# Patient Record
Sex: Female | Born: 1985 | Race: White | Hispanic: No | Marital: Single | Smoking: Former smoker
Health system: Southern US, Community
[De-identification: ages and names within clinical notes are randomized; demographics above are authoritative.]

## PROBLEM LIST (undated history)

## (undated) ENCOUNTER — Inpatient Hospital Stay (HOSPITAL_COMMUNITY): Payer: Self-pay

## (undated) ENCOUNTER — Ambulatory Visit (HOSPITAL_COMMUNITY): Admission: EM | Payer: Medicaid Other

## (undated) DIAGNOSIS — F410 Panic disorder [episodic paroxysmal anxiety] without agoraphobia: Secondary | ICD-10-CM

## (undated) DIAGNOSIS — F32A Depression, unspecified: Secondary | ICD-10-CM

## (undated) DIAGNOSIS — G43109 Migraine with aura, not intractable, without status migrainosus: Secondary | ICD-10-CM

## (undated) DIAGNOSIS — K219 Gastro-esophageal reflux disease without esophagitis: Secondary | ICD-10-CM

## (undated) DIAGNOSIS — Z8774 Personal history of (corrected) congenital malformations of heart and circulatory system: Secondary | ICD-10-CM

## (undated) DIAGNOSIS — F329 Major depressive disorder, single episode, unspecified: Secondary | ICD-10-CM

## (undated) HISTORY — DX: Migraine with aura, not intractable, without status migrainosus: G43.109

## (undated) HISTORY — PX: TONSILLECTOMY: SUR1361

## (undated) HISTORY — DX: Personal history of (corrected) congenital malformations of heart and circulatory system: Z87.74

## (undated) HISTORY — PX: CARDIAC SURGERY: SHX584

---
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
## 2006-03-28 ENCOUNTER — Emergency Department: Payer: Self-pay | Admitting: Emergency Medicine

## 2006-05-08 ENCOUNTER — Ambulatory Visit: Payer: Self-pay | Admitting: Cardiovascular Disease

## 2006-05-15 ENCOUNTER — Ambulatory Visit: Payer: Self-pay

## 2006-05-15 ENCOUNTER — Encounter: Payer: Self-pay | Admitting: Internal Medicine

## 2006-06-05 ENCOUNTER — Inpatient Hospital Stay (HOSPITAL_COMMUNITY): Admission: AD | Admit: 2006-06-05 | Discharge: 2006-06-07 | Payer: Self-pay | Admitting: Obstetrics & Gynecology

## 2006-10-05 ENCOUNTER — Emergency Department: Payer: Self-pay | Admitting: Emergency Medicine

## 2006-10-27 ENCOUNTER — Emergency Department: Payer: Self-pay | Admitting: Emergency Medicine

## 2006-12-01 ENCOUNTER — Encounter: Payer: Self-pay | Admitting: Nurse Practitioner

## 2006-12-01 DIAGNOSIS — K219 Gastro-esophageal reflux disease without esophagitis: Secondary | ICD-10-CM | POA: Insufficient documentation

## 2006-12-10 ENCOUNTER — Emergency Department: Payer: Self-pay | Admitting: Emergency Medicine

## 2007-01-13 ENCOUNTER — Emergency Department: Payer: Self-pay | Admitting: Emergency Medicine

## 2007-04-19 ENCOUNTER — Emergency Department: Payer: Self-pay | Admitting: Emergency Medicine

## 2007-04-20 ENCOUNTER — Emergency Department (HOSPITAL_COMMUNITY): Admission: EM | Admit: 2007-04-20 | Discharge: 2007-04-20 | Payer: Self-pay | Admitting: Emergency Medicine

## 2007-05-04 ENCOUNTER — Emergency Department: Payer: Self-pay | Admitting: Emergency Medicine

## 2007-07-22 ENCOUNTER — Emergency Department: Payer: Self-pay | Admitting: Unknown Physician Specialty

## 2007-09-27 ENCOUNTER — Emergency Department: Payer: Self-pay | Admitting: Emergency Medicine

## 2008-01-04 ENCOUNTER — Emergency Department (HOSPITAL_COMMUNITY): Admission: EM | Admit: 2008-01-04 | Discharge: 2008-01-04 | Payer: Self-pay | Admitting: Emergency Medicine

## 2008-01-11 ENCOUNTER — Emergency Department (HOSPITAL_COMMUNITY): Admission: EM | Admit: 2008-01-11 | Discharge: 2008-01-11 | Payer: Self-pay | Admitting: Emergency Medicine

## 2008-01-16 ENCOUNTER — Emergency Department (HOSPITAL_COMMUNITY): Admission: EM | Admit: 2008-01-16 | Discharge: 2008-01-17 | Payer: Self-pay | Admitting: Emergency Medicine

## 2008-04-12 ENCOUNTER — Emergency Department (HOSPITAL_COMMUNITY): Admission: EM | Admit: 2008-04-12 | Discharge: 2008-04-12 | Payer: Self-pay | Admitting: Emergency Medicine

## 2008-04-26 ENCOUNTER — Emergency Department (HOSPITAL_COMMUNITY): Admission: EM | Admit: 2008-04-26 | Discharge: 2008-04-27 | Payer: Self-pay | Admitting: Emergency Medicine

## 2008-07-29 ENCOUNTER — Emergency Department (HOSPITAL_COMMUNITY): Admission: EM | Admit: 2008-07-29 | Discharge: 2008-07-29 | Payer: Self-pay | Admitting: Emergency Medicine

## 2008-08-30 ENCOUNTER — Emergency Department (HOSPITAL_COMMUNITY): Admission: EM | Admit: 2008-08-30 | Discharge: 2008-08-30 | Payer: Self-pay | Admitting: Emergency Medicine

## 2008-12-14 ENCOUNTER — Emergency Department (HOSPITAL_COMMUNITY): Admission: EM | Admit: 2008-12-14 | Discharge: 2008-12-14 | Payer: Self-pay | Admitting: Emergency Medicine

## 2009-05-30 ENCOUNTER — Emergency Department (HOSPITAL_COMMUNITY): Admission: EM | Admit: 2009-05-30 | Discharge: 2009-05-31 | Payer: Self-pay | Admitting: Emergency Medicine

## 2009-07-21 ENCOUNTER — Emergency Department (HOSPITAL_COMMUNITY): Admission: EM | Admit: 2009-07-21 | Discharge: 2009-07-21 | Payer: Self-pay | Admitting: Emergency Medicine

## 2009-10-02 ENCOUNTER — Emergency Department (HOSPITAL_COMMUNITY): Admission: EM | Admit: 2009-10-02 | Discharge: 2009-10-02 | Payer: Self-pay | Admitting: Family Medicine

## 2009-11-06 ENCOUNTER — Emergency Department (HOSPITAL_COMMUNITY): Admission: EM | Admit: 2009-11-06 | Discharge: 2009-11-06 | Payer: Self-pay | Admitting: Family Medicine

## 2009-12-25 ENCOUNTER — Emergency Department (HOSPITAL_COMMUNITY): Admission: EM | Admit: 2009-12-25 | Discharge: 2009-12-25 | Payer: Self-pay | Admitting: Family Medicine

## 2009-12-29 ENCOUNTER — Emergency Department (HOSPITAL_COMMUNITY): Admission: EM | Admit: 2009-12-29 | Discharge: 2009-12-29 | Payer: Self-pay | Admitting: Emergency Medicine

## 2010-03-01 ENCOUNTER — Emergency Department (HOSPITAL_COMMUNITY): Admission: EM | Admit: 2010-03-01 | Discharge: 2010-03-01 | Payer: Self-pay | Admitting: Family Medicine

## 2010-06-20 LAB — POCT I-STAT, CHEM 8
BUN: 9 mg/dL (ref 6–23)
Chloride: 103 mEq/L (ref 96–112)
Glucose, Bld: 88 mg/dL (ref 70–99)
Hemoglobin: 14.6 g/dL (ref 12.0–15.0)
Potassium: 4.5 mEq/L (ref 3.5–5.1)
Sodium: 140 mEq/L (ref 135–145)

## 2010-06-20 LAB — D-DIMER, QUANTITATIVE: D-Dimer, Quant: 0.28 ug/mL-FEU (ref 0.00–0.48)

## 2010-07-12 LAB — COMPREHENSIVE METABOLIC PANEL
ALT: 9 U/L (ref 0–35)
Albumin: 4.8 g/dL (ref 3.5–5.2)
BUN: 4 mg/dL — ABNORMAL LOW (ref 6–23)
Creatinine, Ser: 0.68 mg/dL (ref 0.4–1.2)
GFR calc Af Amer: >60 mL/min (ref >60)
GFR calc non Af Amer: >60 mL/min (ref >60)
Glucose, Bld: 92 mg/dL (ref 70–99)
Sodium: 142 mEq/L (ref 135–145)
Total Protein: 7 g/dL (ref 6.0–8.3)

## 2010-07-12 LAB — DIFFERENTIAL
Basophils Absolute: 0.1 10*3/uL (ref 0.0–0.1)
Basophils Relative: 1 % (ref 0–1)
Eosinophils Relative: 2 % (ref 0–5)
Monocytes Absolute: 0.7 10*3/uL (ref 0.1–1.0)
Monocytes Relative: 8 % (ref 3–12)
Neutro Abs: 5.6 10*3/uL (ref 1.7–7.7)
Neutrophils Relative %: 66 % (ref 43–77)

## 2010-07-12 LAB — URINALYSIS, ROUTINE W REFLEX MICROSCOPIC
Bilirubin Urine: NEGATIVE
Nitrite: NEGATIVE
Protein, ur: NEGATIVE mg/dL
pH: 6.5 (ref 5.0–8.0)

## 2010-07-12 LAB — CBC
Hemoglobin: 13.8 g/dL (ref 12.0–15.0)
MCV: 88 fL (ref 78.0–100.0)
Platelets: 230 10*3/uL (ref 150–400)

## 2010-07-12 LAB — LIPASE, BLOOD: Lipase: 19 U/L (ref 11–59)

## 2010-07-12 LAB — URINE MICROSCOPIC-ADD ON

## 2010-07-17 LAB — URINALYSIS, ROUTINE W REFLEX MICROSCOPIC
Ketones, ur: NEGATIVE mg/dL
Nitrite: NEGATIVE
Urobilinogen, UA: 1 mg/dL (ref 0.0–1.0)
pH: 7.5 (ref 5.0–8.0)

## 2010-07-17 LAB — URINE MICROSCOPIC-ADD ON

## 2010-07-17 LAB — POCT I-STAT, CHEM 8
Chloride: 103 mEq/L (ref 96–112)
Creatinine, Ser: 0.6 mg/dL (ref 0.4–1.2)
Glucose, Bld: 95 mg/dL (ref 70–99)

## 2010-08-11 ENCOUNTER — Inpatient Hospital Stay (INDEPENDENT_AMBULATORY_CARE_PROVIDER_SITE_OTHER)
Admission: RE | Admit: 2010-08-11 | Discharge: 2010-08-11 | Disposition: A | Discharge status: Home / Self Care | Payer: Self-pay | Source: Ambulatory Visit | Attending: Family Medicine | Admitting: Family Medicine

## 2010-08-11 DIAGNOSIS — R071 Chest pain on breathing: Secondary | ICD-10-CM

## 2010-08-23 NOTE — Op Note (Signed)
[name], [name]                 ACCOUNT NO.:  [id number]   MEDICAL RECORD NO.:  [id number]          PATIENT TYPE:  AMB   LOCATION:  DSC                          FACILITY:  [facility]   PHYSICIAN:  Kathy Breach, M.D.      DATE OF BIRTH:  [birth year]   DATE OF PROCEDURE:  08/09/2004  DATE OF DISCHARGE:                                 OPERATIVE REPORT   PREOPERATIVE DIAGNOSIS:  Recurrent and chronic tonsillitis.   OPERATIVE PROCEDURE:  Adenotonsillectomy.   POSTOPERATIVE DIAGNOSIS:  Recurrent and chronic tonsillitis.   DESCRIPTION OF PROCEDURE:  With the patient under general orotracheal  anesthesia, the Crowe-Davis mouth gag was inserted and the patient put in  the [place] position.  Tonsils were 1+ enlarged, deeply invasive into the  fossa.  The soft palate was normal in configuration.  The tonsils were  nonpulsatile to palpation.  Mirror visualization of the nasopharynx revealed  midline adenoid tissue present.  The left tonsil was grasped at the superior  pole and removed by electrical dissection, maintaining hemostasis with  electrocautery.  The right tonsil, both being very deeply seated and  fibrotic, removed by electrical dissection, maintaining hemostasis with  electrocauterization.  Red rubber catheter was then passed through the left  nasal chamber and used to elevate the soft palate.  Under mirror  visualization with the suction cautery unit, adenoidal tissue ablated down  to the constricture muscle fibers midline to the Rosenmuller's fossa  bilaterally as they were present.  Blood loss during the procedure was less  than 15 cc.  The patient tolerated the procedure well and was taken to the  recovery room in stable general condition.      JGL/MEDQ  D:  08/09/2004  T:  08/09/2004  Job:  [id number]

## 2010-08-23 NOTE — Assessment & Plan Note (Signed)
[facility]                            CARDIOLOGY OFFICE NOTE   NAME:[name], [name]                        MRN:          [id number]  DATE:05/08/2006                            DOB:          02-16-1986    Miss [name] is a delightful 25 year old single female referred by Dr.  Tamela Oddi for history of congenital VSD. She is [redacted] weeks pregnant.  Her due date is March 9th. This is her first pregnancy. She has not had  any complications so far. Apparently she thought there was a question  about what sort of delivery, vaginal or C-section, would be best for  her. The patient had a VSD repair at the age of 31 months. She does not  recall where this was done or any other details of the surgery. She does  not seem to think there were any other congenital abnormalities except  for the hole in her heart. She recalls being a normal youth, she has  never had any problems with exercise or keeping up with people. There  has been no history of cyanosis, palpitations, or syncope.   The patient has not had an ultrasound recently.   She does not follow SBE prophylaxis.   REVIEW OF SYSTEMS:  Remarkable for no significant chest pain, shortness  of breath, she has had some lower extremity swelling since her pregnancy  but not before. She used to smoke on occasion but does not now. She does  not drink. She does not do drugs. She takes no medications routinely.   Her only other surgery has included a tonsillectomy, wisdom teeth  removal in 2006, her VSD repair was in 1989.   FAMILY HISTORY:  Remarkable for her mother and father being alive. She  has 2 brothers who are alive at age 41 and 40 without congenital heart  disease.   The patient apparently is having a healthy baby boy named [name].   PHYSICAL EXAMINATION:  Remarkable for blood pressure 126/70, pulse is 70  regular.  HEENT: Normal.  LUNGS: Clear.  Carotids are normal.  Her sternum has a midline scar  from her VSD repair. The is an S1, S2.  There is no VSD murmur. There is no split second heart sound. There is  no RV heave.  ABDOMEN: Protuberant consistent with gestational age.  Distal pulses are intact with no edema.   The patient did not have prior EKG for me to review here in our office;  however, she had a normal EKG done at Dr. Marcia Brash office.   IMPRESSION:  The patient is asymptomatic. She does have a residual  murmur. I suspect that her ventricular septal defect patch has held  well. She can deliver the baby any way that is safest for her and the  fetus.   We will do a 2D echocardiogram to make sure that we are not missing any  residual defects; however, this would be very unusual in the setting of  a normal electrocardiogram and no murmur.   So long as her echo is low risk she will be allowed to proceed  with her  pregnancy without any preference towards caesarian section or vaginal  delivery.   Again the patient does not need subacute bacterial endocarditis  prophylaxis.   Further recommendations will be based on the results of her  echocardiogram, however, I think that she should have an uneventful  delivery.     [name]. Toni Emms, MD, [facility]  Electronically Signed    PCN/MedQ  DD: 05/08/2006  DT: 05/08/2006  Job #: [id number]   cc:   Roseanna Rainbow, M.D.

## 2010-11-14 ENCOUNTER — Ambulatory Visit (INDEPENDENT_AMBULATORY_CARE_PROVIDER_SITE_OTHER): Payer: Self-pay

## 2010-11-14 ENCOUNTER — Inpatient Hospital Stay (INDEPENDENT_AMBULATORY_CARE_PROVIDER_SITE_OTHER)
Admission: RE | Admit: 2010-11-14 | Discharge: 2010-11-14 | Disposition: A | Discharge status: Home / Self Care | Payer: Self-pay | Source: Ambulatory Visit | Attending: Family Medicine | Admitting: Family Medicine

## 2010-11-14 DIAGNOSIS — M779 Enthesopathy, unspecified: Secondary | ICD-10-CM

## 2011-02-15 ENCOUNTER — Encounter: Payer: Self-pay | Admitting: *Deleted

## 2011-02-15 ENCOUNTER — Emergency Department (HOSPITAL_COMMUNITY)
Admission: EM | Admit: 2011-02-15 | Discharge: 2011-02-15 | Disposition: A | Discharge status: Home / Self Care | Payer: Self-pay | Attending: Emergency Medicine | Admitting: Emergency Medicine

## 2011-02-15 DIAGNOSIS — IMO0002 Reserved for concepts with insufficient information to code with codable children: Secondary | ICD-10-CM | POA: Insufficient documentation

## 2011-02-15 DIAGNOSIS — M25519 Pain in unspecified shoulder: Secondary | ICD-10-CM | POA: Insufficient documentation

## 2011-02-15 DIAGNOSIS — S43409A Unspecified sprain of unspecified shoulder joint, initial encounter: Secondary | ICD-10-CM

## 2011-02-15 DIAGNOSIS — X58XXXA Exposure to other specified factors, initial encounter: Secondary | ICD-10-CM | POA: Insufficient documentation

## 2011-02-15 MED ORDER — HYDROCODONE-ACETAMINOPHEN 7.5-325 MG/15ML PO SOLN: 15.0000 mL | ORAL | Status: AC | PRN

## 2011-02-15 MED ORDER — IBUPROFEN 400 MG PO TABS: 400.0000 mg | ORAL_TABLET | Freq: Three times a day (TID) | ORAL | Status: AC | PRN

## 2011-02-15 MED ORDER — IBUPROFEN 800 MG PO TABS
800.0000 mg | ORAL_TABLET | Freq: Once | ORAL | Status: AC
  Administered 2011-02-15: 800 mg via ORAL
  Filled 2011-02-15: qty 1

## 2011-02-15 NOTE — ED Provider Notes (Signed)
History     CSN: [id number] Arrival date & time: 02/15/2011  3:50 PM   First MD Initiated Contact with Patient 02/15/11 2022      Chief Complaint  Patient presents with  . Shoulder Pain    hurting for few months/hurts with movement     HPI; Reports persistent (L) shoulder pain x 2 to 3 months. 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 Denies new injury. See mx times at [facility] for same. Xr's w/o acute findings.  History reviewed. No pertinent past medical history.  Past Surgical History  Procedure Date  . Cardiac surgery     as a baby  . Tonsillectomy     No family history on file.  History  Substance Use Topics  . Smoking status: Current Everyday Smoker  . Smokeless tobacco: Not on file  . Alcohol Use: No    OB History    Grav Para Term Preterm Abortions TAB SAB Ect Mult Living                  Review of Systems  Constitutional: Negative.   HENT: Negative.   Eyes: Negative.   Respiratory: Negative.   Cardiovascular: Negative.   Gastrointestinal: Negative.   Genitourinary: Negative.   Musculoskeletal: Negative.   Skin: Negative.   Neurological: Negative.   Hematological: Negative.   Psychiatric/Behavioral: Negative.     Allergies  Review of patient's allergies indicates no known allergies.  Home Medications   Current Outpatient Rx  Name Route Sig Dispense Refill  . HYDROCODONE-ACETAMINOPHEN 5-500 MG PO TABS Oral Take 1 tablet by mouth every 6 (six) hours as needed. For pain     . IBUPROFEN 200 MG PO TABS Oral Take 800 mg by mouth every 6 (six) hours as needed. For pain       BP 120/77  Pulse 85  Temp(Src) 97.9 F (36.6 C) (Oral)  Resp 20  SpO2 98%  Physical Exam  Constitutional: She is oriented to person, place, and time. She appears well-developed and well-nourished.  HENT:  Head: Normocephalic.  Eyes: Conjunctivae are normal.  Neck: Normal range of motion.  Cardiovascular: Normal rate.     Pulmonary/Chest: Effort normal.  Musculoskeletal: Normal range of motion.       Left shoulder: Normal.       Pain w/ active ROM but full ROM  Neurological: She is alert and oriented to person, place, and time. She has normal reflexes.  Skin: Skin is warm and dry.  Psychiatric: She has a normal mood and affect.    ED Course  Procedures Discussed need for pt to arrange f/u w/ ortho as ED limited in ability to do more that r/u acute injury. Will plan for d/c home w/ short course of med for pain and orth referral. Pt agreeable w/ plan.  Labs Reviewed - No data to display No results found.   No diagnosis found.    MDM  Chronic (L) shoulder pain. PE unremarkable. Will tx with anti-inflammatories and med for pain.        [name], NP 02/15/11 2203

## 2011-02-15 NOTE — ED Provider Notes (Signed)
Medical screening examination/treatment/procedure(s) were performed by non-physician practitioner and as supervising physician I was immediately available for consultation/collaboration.  Doug Sou, MD 02/15/11 [phone]

## 2011-02-15 NOTE — Progress Notes (Signed)
Orthopedic Tech Progress Note Patient Details:  [name] 06-05-1985 [id number]  Other Ortho Devices Type of Ortho Device: Other (comment) (arm foam sling) Ortho Device Interventions: Application   [name] 02/15/2011, 10:04 PM

## 2011-02-15 NOTE — ED Notes (Signed)
Introduced self to PT. Explained delays to patient. Pt states her left shoulder pain is 8/10. She has suffered from this pain since July. Pt has FROM but has pain. She has not followed up with a specialist. Pt is waiting for EDP. She drove herself here and has no allergies. Will continue to monitor.

## 2011-02-15 NOTE — ED Notes (Signed)
Pt is having pain to left shoulder and left arm for a couple of months.  Pt sts pain increases with movement and she has pain to upper shoulder with deep breath

## 2011-10-20 ENCOUNTER — Emergency Department (HOSPITAL_COMMUNITY)
Admission: EM | Admit: 2011-10-20 | Discharge: 2011-10-20 | Disposition: A | Discharge status: Home / Self Care | Payer: Self-pay | Attending: Emergency Medicine | Admitting: Emergency Medicine

## 2011-10-20 ENCOUNTER — Encounter (HOSPITAL_COMMUNITY): Payer: Self-pay

## 2011-10-20 DIAGNOSIS — J029 Acute pharyngitis, unspecified: Secondary | ICD-10-CM | POA: Insufficient documentation

## 2011-10-20 DIAGNOSIS — F17200 Nicotine dependence, unspecified, uncomplicated: Secondary | ICD-10-CM | POA: Insufficient documentation

## 2011-10-20 DIAGNOSIS — J020 Streptococcal pharyngitis: Secondary | ICD-10-CM

## 2011-10-20 MED ORDER — HYDROCODONE-HOMATROPINE 5-1.5 MG/5ML PO SYRP: 5.0000 mL | ORAL_SOLUTION | Freq: Four times a day (QID) | ORAL | Status: AC | PRN

## 2011-10-20 NOTE — ED Provider Notes (Signed)
History     CSN: [id number]  Arrival date & time 10/20/11  6269   First MD Initiated Contact with Patient 10/20/11 [phone]      Chief Complaint  Patient presents with  . Sore Throat    (Consider location/radiation/quality/duration/timing/severity/associated sxs/prior treatment) HPI Comments: Chief complaint of sore throat.  Onset of symptoms began Thursday evening gradually worsening to today.  She has taken Mucinex with minimal relief.  She reports pain with swallowing denies difficulty swallowing.  She took some amoxicillin that she had left over from an old prescription yesterday and woke up this morning with cough that is nonproductive.  She denies fevers, night sweats, chills, abdominal pain, shortness of breath, difficulty breathing.  Patient is a current everyday smoker.  Patient is a 26 y.o. female presenting with pharyngitis. The history is provided by the patient.  Sore Throat Associated symptoms include a sore throat. Pertinent negatives include no abdominal pain, chest pain, chills, congestion, coughing, diaphoresis, fatigue, fever, headaches, neck pain or rash.    No past medical history on file.  Past Surgical History  Procedure Date  . Cardiac surgery     as a baby  . Tonsillectomy     No family history on file.  History  Substance Use Topics  . Smoking status: Current Everyday Smoker  . Smokeless tobacco: Not on file  . Alcohol Use: No    OB History    Grav Para Term Preterm Abortions TAB SAB Ect Mult Living                  Review of Systems  Constitutional: Negative for fever, chills, diaphoresis, activity change, appetite change and fatigue.  HENT: Positive for sore throat. Negative for congestion, facial swelling, rhinorrhea, sneezing, drooling, trouble swallowing, neck pain, neck stiffness, dental problem, voice change, postnasal drip and sinus pressure.   Eyes: Negative for visual disturbance.  Respiratory: Negative for cough, choking, shortness of  breath, wheezing and stridor.   Cardiovascular: Negative for chest pain.  Gastrointestinal: Negative for abdominal pain.  Skin: Negative for rash.  Neurological: Negative for dizziness and headaches.  Hematological: Negative for adenopathy. Does not bruise/bleed easily.    Allergies  Review of patient's allergies indicates no known allergies.  Home Medications   Current Outpatient Rx  Name Route Sig Dispense Refill  . AMOXICILLIN 500 MG PO CAPS Oral Take 1,000 mg by mouth daily. Just took once yesterday    . MUCINEX PO Oral Take 2 tablets by mouth daily as needed.    Marland Kitchen ADVIL PM PO Oral Take 2 tablets by mouth at bedtime as needed. For sleep and pain      BP 123/81  Pulse 101  Temp 98.2 F (36.8 C) (Oral)  Resp 20  SpO2 99%  Physical Exam  Nursing note and vitals reviewed. Constitutional: She is oriented to person, place, and time. She appears well-developed and well-nourished. No distress.  HENT:  Head: Normocephalic and atraumatic. No trismus in the jaw.  Right Ear: Tympanic membrane, external ear and ear canal normal.  Left Ear: Tympanic membrane, external ear and ear canal normal.  Nose: Nose normal. No rhinorrhea. Right sinus exhibits no maxillary sinus tenderness and no frontal sinus tenderness. Left sinus exhibits no maxillary sinus tenderness and no frontal sinus tenderness.  Mouth/Throat: Uvula is midline and mucous membranes are normal. Normal dentition. No dental abscesses or uvula swelling. No oropharyngeal exudate, posterior oropharyngeal edema, posterior oropharyngeal erythema or tonsillar abscesses.  No submental edema, tongue not elevated, no trismus. No impending airway obstruction; Pt able to speak full sentences, swallow intact, no drooling, stridor, or tonsillar/uvula displacement. No palatal petechia  Eyes: Conjunctivae are normal.  Neck: Trachea normal, normal range of motion and full passive range of motion without pain. Neck supple. No rigidity. No  erythema and normal range of motion present. No Brudzinski's sign noted.       Flexion and extension of neck without pain or difficulty. Able to breath without difficulty in extension.  Cardiovascular: Normal rate and regular rhythm.   Pulmonary/Chest: Effort normal and breath sounds normal. No stridor. No respiratory distress. She has no wheezes.  Abdominal: Soft. There is no tenderness.       No obvious evidence of splenomegaly. Non ttp.   Musculoskeletal: Normal range of motion.  Lymphadenopathy:       Head (right side): No preauricular and no posterior auricular adenopathy present.       Head (left side): No preauricular and no posterior auricular adenopathy present.    She has no cervical adenopathy.  Neurological: She is alert and oriented to person, place, and time.  Skin: Skin is warm and dry. No rash noted. She is not diaphoretic.  Psychiatric: She has a normal mood and affect.    ED Course  Procedures (including critical care time)   Labs Reviewed  RAPID STREP SCREEN   No results found.   No diagnosis found.    MDM  Pt afebrile without tonsillar exudate, negative strep. Presents with mild cervical lymphadenopathy, & dysphagia; diagnosis of viral pharyngitis. No abx indicated. DC w symptomatic tx for pain  Pt does not appear dehydrated, but did discuss importance of water rehydration. Presentation non concerning for PTA or infxn spread to soft tissue. No trismus or uvula deviation. Specific return precautions discussed. Pt able to drink water in ED without difficulty with intact air way. Recommended PCP follow up.         Jaci Carrel, [place] 10/20/11 1110

## 2011-10-20 NOTE — ED Provider Notes (Signed)
Medical screening examination/treatment/procedure(s) were performed by non-physician practitioner and as supervising physician I was immediately available for consultation/collaboration.  Cheri Guppy, MD 10/20/11 [phone]

## 2011-10-20 NOTE — ED Notes (Signed)
Pt here for sore throat since Thursday, sts get better and worse on and off, today woke up with cough.

## 2012-04-07 NOTE — L&D Delivery Note (Signed)
Delivery Note At 3:41 PM a viable female was delivered via  (Presentation: ROA ).   Placenta status: delivered with cord traction, intact, 3VC .    Anesthesia:  none Episiotomy: none Lacerations: none Suture Repair: n/a Est. Blood Loss (mL): 200 ml  Mom to postpartum.  Baby to nursery-stable.  JACKSON-MOORE,Cherysh Epperly A 12/05/2012, 3:53 PM

## 2012-05-03 LAB — OB RESULTS CONSOLE HIV ANTIBODY (ROUTINE TESTING): HIV: NONREACTIVE

## 2012-05-03 LAB — OB RESULTS CONSOLE HGB/HCT, BLOOD
HCT: 38 %
Hemoglobin: 13.4 g/dL

## 2012-05-03 LAB — OB RESULTS CONSOLE PLATELET COUNT: Platelets: 250 10*3/uL

## 2012-05-03 LAB — OB RESULTS CONSOLE RPR: RPR: NONREACTIVE

## 2012-06-10 ENCOUNTER — Other Ambulatory Visit: Payer: Self-pay | Admitting: Obstetrics

## 2012-06-30 ENCOUNTER — Ambulatory Visit (INDEPENDENT_AMBULATORY_CARE_PROVIDER_SITE_OTHER): Payer: 59

## 2012-06-30 ENCOUNTER — Other Ambulatory Visit: Payer: Self-pay | Admitting: Obstetrics

## 2012-06-30 ENCOUNTER — Encounter: Payer: Self-pay | Admitting: Obstetrics & Gynecology

## 2012-06-30 DIAGNOSIS — Z3480 Encounter for supervision of other normal pregnancy, unspecified trimester: Secondary | ICD-10-CM

## 2012-06-30 DIAGNOSIS — Z3481 Encounter for supervision of other normal pregnancy, first trimester: Secondary | ICD-10-CM

## 2012-06-30 LAB — US OB DETAIL + 14 WK

## 2012-07-13 ENCOUNTER — Encounter: Payer: Self-pay | Admitting: *Deleted

## 2012-07-14 ENCOUNTER — Ambulatory Visit (INDEPENDENT_AMBULATORY_CARE_PROVIDER_SITE_OTHER): Payer: 59 | Admitting: Obstetrics & Gynecology

## 2012-07-14 ENCOUNTER — Encounter: Payer: Self-pay | Admitting: Obstetrics & Gynecology

## 2012-07-14 VITALS — BP 107/72 | Temp 97.8°F | Wt 163.0 lb

## 2012-07-14 DIAGNOSIS — Z3480 Encounter for supervision of other normal pregnancy, unspecified trimester: Secondary | ICD-10-CM | POA: Insufficient documentation

## 2012-07-14 DIAGNOSIS — Z3482 Encounter for supervision of other normal pregnancy, second trimester: Secondary | ICD-10-CM

## 2012-07-14 LAB — POCT URINALYSIS DIPSTICK
Blood, UA: NEGATIVE
Glucose, UA: NEGATIVE
Nitrite, UA: NEGATIVE
Protein, UA: NEGATIVE
Urobilinogen, UA: NEGATIVE

## 2012-07-14 NOTE — Patient Instructions (Signed)
Glucose Tolerance Test This is a test to see how your body processes carbohydrates. This test is often done to check patients for diabetes or the possibility of developing it. PREPARATION FOR TEST You should have nothing to eat or drink 12 hours before the test. You will be given a form of sugar (glucose) and then blood samples will be drawn from your vein to determine the level of sugar in your blood. Alternatively, blood may be drawn from your finger for testing. You should not smoke or exercise during the test. NORMAL FINDINGS  Fasting: 70-115 mg/dL  30 minutes: less than 200 mg/dL  1 hour: less than 200 mg/dL  2 hours: less than 140 mg/dL  3 hours: 70-115 mg/dL  4 hours: 70-115 mg/dL Ranges for normal findings may vary among different laboratories and hospitals. You should always check with your doctor after having lab work or other tests done to discuss the meaning of your test results and whether your values are considered within normal limits. MEANING OF TEST Your caregiver will go over the test results with you and discuss the importance and meaning of your results, as well as treatment options and the need for additional tests. OBTAINING THE TEST RESULTS It is your responsibility to obtain your test results. Ask the lab or department performing the test when and how you will get your results. Document Released: 04/16/2004 Document Revised: 06/16/2011 Document Reviewed: 03/04/2008 [employer] Patient Information 2013 [employer].  

## 2012-07-14 NOTE — Progress Notes (Signed)
Pulse- 94 

## 2012-07-14 NOTE — Progress Notes (Signed)
Doing well 

## 2012-08-05 ENCOUNTER — Encounter: Payer: Self-pay | Admitting: Obstetrics & Gynecology

## 2012-08-05 LAB — US OB DETAIL + 14 WK

## 2012-08-07 ENCOUNTER — Encounter: Payer: Self-pay | Admitting: Obstetrics & Gynecology

## 2012-08-11 ENCOUNTER — Other Ambulatory Visit: Payer: 59 | Admitting: *Deleted

## 2012-08-11 ENCOUNTER — Ambulatory Visit (INDEPENDENT_AMBULATORY_CARE_PROVIDER_SITE_OTHER): Payer: 59 | Admitting: Obstetrics & Gynecology

## 2012-08-11 ENCOUNTER — Encounter: Payer: Self-pay | Admitting: Obstetrics & Gynecology

## 2012-08-11 VITALS — BP 117/73 | Temp 97.8°F | Wt 171.6 lb

## 2012-08-11 DIAGNOSIS — Z3482 Encounter for supervision of other normal pregnancy, second trimester: Secondary | ICD-10-CM

## 2012-08-11 DIAGNOSIS — N72 Inflammatory disease of cervix uteri: Secondary | ICD-10-CM

## 2012-08-11 DIAGNOSIS — Z3480 Encounter for supervision of other normal pregnancy, unspecified trimester: Secondary | ICD-10-CM

## 2012-08-11 LAB — CBC
MCH: 28 pg (ref 26.0–34.0)
MCHC: 33.4 g/dL (ref 30.0–36.0)
MCV: 83.6 fL (ref 78.0–100.0)
Platelets: 224 10*3/uL (ref 150–400)
RBC: 3.97 MIL/uL (ref 3.87–5.11)
RDW: 14.2 % (ref 11.5–15.5)

## 2012-08-11 LAB — POCT URINALYSIS DIPSTICK
Glucose, UA: NEGATIVE
Leukocytes, UA: NEGATIVE
Nitrite, UA: NEGATIVE
Protein, UA: NEGATIVE
Spec Grav, UA: <=1.005
Urobilinogen, UA: NEGATIVE

## 2012-08-11 NOTE — Patient Instructions (Signed)
Vaginal Bleeding During Pregnancy, Second Trimester A small amount of bleeding (spotting) is relatively common in pregnancy. It usually stops on its own. There are many causes for bleeding or spotting in pregnancy. Some bleeding may be related to the pregnancy and some may not. Cramping with the bleeding is more serious and concerning. Tell your caregiver if you have any vaginal bleeding.  CAUSES   Infection, inflammation or growths on the cervix.  The placenta may partially or completely be covering the opening of the cervix inside the uterus.  The placenta may have separated from the uterus.  You may be having early/preterm labor.  The cervix is not strong enough to keep a baby inside the uterus (cervical insufficiency).  Many tiny cysts in the uterus instead of pregnancy tissue (molar pregnancy) SYMPTOMS   Vaginal spotting or bleeding with or without cramps.  Uterine contractions.  Abnormal vaginal discharge.  You may have spotting or spotting after having sexual intercourse. DIAGNOSIS  To evaluate the pregnancy, your caregiver may:  Do a pelvic exam.  Take blood tests.  Do an ultrasound. It is very important to follow your caregiver's instructions.  TREATMENT   Evaluation of the pregnancy with blood tests and ultrasound.  Bed rest (getting up to use the bathroom only).  Rho-gam immunization if the mother is Rh negative and the father is Rh positive.  If you are having uterine contractions, you may be given medication to stop the contractions.  If you have cervical insufficiency, you may have a suture placed in the cervix to close it. HOME CARE INSTRUCTIONS   If your caregiver orders bed rest, you may need to make arrangements for the care of other children and for any other responsibilities. However, your caregiver may allow you to continue light activity.  Keep track of the number of pads you use each day and how soaked (saturated) they are. Write this down.  Do  not use tampons. Do not douche.  Do not have sexual intercourse or orgasms until approved by your physician.  Save any tissue that you pass for your caregiver to see.  Take medicine for cramps only with your caregiver's permission.  Do not take aspirin because it can make you bleed.  Do not exercise, do any strenuous activities or heavy lifting without your caregiver's permission. SEEK IMMEDIATE MEDICAL CARE IF:   You experience severe cramps in your stomach, back or belly (abdomen).  You have uterine contractions.  You have an oral temperature above 102 F (38.9 C), not controlled by medicine.  You develop chills.  You pass large clots or tissue.  Your bleeding increases or you become light-headed, weak or have fainting episodes.  You have leaking or a gush of fluid from your vagina. Document Released: 01/01/2005 Document Revised: 06/16/2011 Document Reviewed: 07/13/2008 [facility] Patient Information 2013 [place], [place].

## 2012-08-11 NOTE — Progress Notes (Signed)
Pulse- 96 

## 2012-08-11 NOTE — Progress Notes (Signed)
Doing well 

## 2012-08-12 LAB — HIV ANTIBODY (ROUTINE TESTING W REFLEX): HIV: NONREACTIVE

## 2012-08-12 LAB — GLUCOSE TOLERANCE, 2 HOURS W/ 1HR
Glucose, 1 hour: 156 mg/dL (ref 70–170)
Glucose, Fasting: 81 mg/dL (ref 70–99)

## 2012-08-13 LAB — GC/CHLAMYDIA PROBE AMP, URINE: GC Probe Amp, Urine: NEGATIVE

## 2012-08-15 ENCOUNTER — Inpatient Hospital Stay (HOSPITAL_COMMUNITY)
Admission: AD | Admit: 2012-08-15 | Discharge: 2012-08-15 | Disposition: A | Discharge status: Home / Self Care | Payer: 59 | Source: Ambulatory Visit | Attending: Obstetrics | Admitting: Obstetrics

## 2012-08-15 ENCOUNTER — Encounter (HOSPITAL_COMMUNITY): Payer: Self-pay | Admitting: *Deleted

## 2012-08-15 DIAGNOSIS — M545 Low back pain, unspecified: Secondary | ICD-10-CM

## 2012-08-15 DIAGNOSIS — O99891 Other specified diseases and conditions complicating pregnancy: Secondary | ICD-10-CM

## 2012-08-15 DIAGNOSIS — S39012A Strain of muscle, fascia and tendon of lower back, initial encounter: Secondary | ICD-10-CM

## 2012-08-15 HISTORY — DX: Gastro-esophageal reflux disease without esophagitis: K21.9

## 2012-08-15 LAB — URINALYSIS, ROUTINE W REFLEX MICROSCOPIC
Bilirubin Urine: NEGATIVE
Glucose, UA: NEGATIVE mg/dL
Ketones, ur: 15 mg/dL — AB
Nitrite: POSITIVE — AB
Specific Gravity, Urine: >1.03 — ABNORMAL HIGH (ref 1.005–1.030)
pH: 5.5 (ref 5.0–8.0)

## 2012-08-15 LAB — URINE MICROSCOPIC-ADD ON

## 2012-08-15 MED ORDER — CYCLOBENZAPRINE HCL 5 MG PO TABS: 5.0000 mg | ORAL_TABLET | Freq: Three times a day (TID) | ORAL | Status: DC | PRN

## 2012-08-15 MED ORDER — CEPHALEXIN 500 MG PO CAPS: 500.0000 mg | ORAL_CAPSULE | Freq: Three times a day (TID) | ORAL | Status: DC

## 2012-08-15 MED ORDER — CYCLOBENZAPRINE HCL 10 MG PO TABS
10.0000 mg | ORAL_TABLET | Freq: Once | ORAL | Status: AC
  Administered 2012-08-15: 10 mg via ORAL
  Filled 2012-08-15: qty 1

## 2012-08-15 MED ORDER — ONDANSETRON 4 MG PO TBDP
4.0000 mg | ORAL_TABLET | Freq: Once | ORAL | Status: AC
  Administered 2012-08-15: 4 mg via ORAL
  Filled 2012-08-15: qty 1

## 2012-08-15 NOTE — MAU Note (Signed)
Pt presents with complaints of back pain that started around 230 today. Denies any vaginal bleeding or leakage of fluid

## 2012-08-15 NOTE — Progress Notes (Signed)
Pt started to vomit after drinking water. No nausea prior to incident. Informed D. Poe, CNM. zofran ordered

## 2012-08-15 NOTE — MAU Provider Note (Signed)
Chief Complaint:  Back Pain    HPI: [name] is a 27 y.o. G2P1001 at [redacted]w[redacted]d who presents to maternity admissions reporting acute onset about 4 hrs ago of sharp constant left back pain that at times radiates to right. Tried Tylenol without benefit. No previous episodes. No injury. No hematuria, dysuria, frequency or urgency of urination. No fever/chillls. Denies contractions, leakage of fluid or vaginal bleeding. Good fetal movement.   Pregnancy Course: PNC at [facility] essentially uncomplicated. Normal fetal echo d/t FH cardiac defect. GERD  Past Medical History: Past Medical History  Diagnosis Date  . Medical history non-contributory     Past obstetric history: OB History   Grav Para Term Preterm Abortions TAB SAB Ect Mult Living   2 1 1       1      # Outc Date GA Lbr Len/2nd Wgt Sex Del Anes PTL Lv   1 TRM 2/08 [redacted]w[redacted]d  7lb1oz(3.204kg) M SVD None  Yes   2 CUR               Past Surgical History: Past Surgical History  Procedure Laterality Date  . Cardiac surgery      as a baby  . Tonsillectomy      Family History: Family History  Problem Relation Age of Onset  . Congestive Heart Failure Other   . Cancer Other     breast  . Asthma Other   . Stroke Other     Social History: History  Substance Use Topics  . Smoking status: Former Games developer  . Smokeless tobacco: Not on file  . Alcohol Use: No    Allergies: No Known Allergies  Meds:  Prescriptions prior to admission  Medication Sig Dispense Refill  . amoxicillin (AMOXIL) 500 MG capsule Take 1,000 mg by mouth daily. Just took once yesterday      . butalbital-acetaminophen-caffeine (FIORICET, ESGIC) 50-325-40 MG per tablet       . GuaiFENesin (MUCINEX PO) Take 2 tablets by mouth daily as needed.      . Ibuprofen-Diphenhydramine Cit (ADVIL PM PO) Take 2 tablets by mouth at bedtime as needed. For sleep and pain      . metroNIDAZOLE (FLAGYL) 500 MG tablet       . omeprazole (PRILOSEC) 20 MG capsule       .  Prenat-FeCbn-FeAspGl-FA-Omega (OB COMPLETE PETITE) 35-5-1-200 MG CAPS       . Prenatal Vit-Fe Fumarate-FA (PRENATAL MULTIVITAMIN) TABS Take 1 tablet by mouth daily at 12 noon.        ROS: Pertinent findings in history of present illness.  Physical Exam  Blood pressure 124/65, pulse 98, temperature 97.7 F (36.5 C), resp. rate 18, height 5\' 1"  (1.549 m), weight 167 lb (75.751 kg), last menstrual period 02/23/2012. GENERAL: Well-developed, well-nourished female in no acute distress.  HEENT: normocephalic HEART: normal rate RESP: normal effort ABDOMEN: Soft, non-tender, gravid appropriate for gestational age BACK: TTP left back at waist level, neg CVAT bilat EXTREMITIES: Nontender, no edema NEURO: alert and oriented Dilation: Closed Effacement (%): Thick Cervical Position: Posterior Exam by:: D. Simrah Chatham CNM FHT: DT 150 10 bpm accelerations Contractions: none   Labs: No results found for this or any previous visit (from the past 24 hour(s)).  MAU Course: Vomited after drinking water> Zofran 4 mg ODT given Flexeril given with relief  Assessment: 1. Low back strain, initial encounter     Plan: Discharge home Labor precautions and fetal kick counts    Medication List  TAKE these medications       acetaminophen 325 MG tablet  Commonly known as:  TYLENOL  Take 650 mg by mouth every 6 (six) hours as needed for pain.     butalbital-acetaminophen-caffeine 50-325-40 MG per tablet  Commonly known as:  FIORICET, ESGIC  Take 1 tablet by mouth 2 (two) times daily as needed for headache.     cyclobenzaprine 5 MG tablet  Commonly known as:  FLEXERIL  Take 1 tablet (5 mg total) by mouth 3 (three) times daily as needed for muscle spasms.     omeprazole 20 MG capsule  Commonly known as:  PRILOSEC  Take 20 mg by mouth daily as needed (heartburn).     prenatal multivitamin Tabs  Take 1 tablet by mouth daily at 12 noon.       Follow-up Information   Follow up with [facility] (Keep your scheduled appointment)    Contact information:   [address] [place] [address] [phone]      [name], CNM 08/15/2012 6:34 PM

## 2012-08-23 ENCOUNTER — Encounter: Payer: Self-pay | Admitting: Obstetrics & Gynecology

## 2012-08-26 ENCOUNTER — Ambulatory Visit (INDEPENDENT_AMBULATORY_CARE_PROVIDER_SITE_OTHER): Payer: 59 | Admitting: Obstetrics & Gynecology

## 2012-08-26 VITALS — BP 118/79 | Temp 98.1°F | Wt 169.0 lb

## 2012-08-26 DIAGNOSIS — Z3482 Encounter for supervision of other normal pregnancy, second trimester: Secondary | ICD-10-CM

## 2012-08-26 DIAGNOSIS — Z3480 Encounter for supervision of other normal pregnancy, unspecified trimester: Secondary | ICD-10-CM

## 2012-08-26 NOTE — Patient Instructions (Addendum)
Contraception Choices  Contraception (birth control) is the use of any methods or devices to prevent pregnancy. Below are some methods to help avoid pregnancy.  HORMONAL METHODS   · Contraceptive implant. This is a thin, plastic tube containing progesterone hormone. It does not contain estrogen hormone. Your caregiver inserts the tube in the inner part of the upper arm. The tube can remain in place for up to 3 years. After 3 years, the implant must be removed. The implant prevents the ovaries from releasing an egg (ovulation), thickens the cervical mucus which prevents sperm from entering the uterus, and thins the lining of the inside of the uterus.  · Progesterone-only injections. These injections are given every 3 months by your caregiver to prevent pregnancy. This synthetic progesterone hormone stops the ovaries from releasing eggs. It also thickens cervical mucus and changes the uterine lining. This makes it harder for sperm to survive in the uterus.  · Birth control pills. These pills contain estrogen and progesterone hormone. They work by stopping the egg from forming in the ovary (ovulation). Birth control pills are prescribed by a caregiver. Birth control pills can also be used to treat heavy periods.  · Minipill. This type of birth control pill contains only the progesterone hormone. They are taken every day of each month and must be prescribed by your caregiver.  · Birth control patch. The patch contains hormones similar to those in birth control pills. It must be changed once a week and is prescribed by a caregiver.  · Vaginal ring. The ring contains hormones similar to those in birth control pills. It is left in the vagina for 3 weeks, removed for 1 week, and then a new one is put back in place. The patient must be comfortable inserting and removing the ring from the vagina. A caregiver's prescription is necessary.  · Emergency contraception. Emergency contraceptives prevent pregnancy after unprotected  sexual intercourse. This pill can be taken right after sex or up to 5 days after unprotected sex. It is most effective the sooner you take the pills after having sexual intercourse. Emergency contraceptive pills are available without a prescription. Check with your pharmacist. Do not use emergency contraception as your only form of birth control.  BARRIER METHODS   · Female condom. This is a thin sheath (latex or rubber) that is worn over the penis during sexual intercourse. It can be used with spermicide to increase effectiveness.  · Female condom. This is a soft, loose-fitting sheath that is put into the vagina before sexual intercourse.  · Diaphragm. This is a soft, latex, dome-shaped barrier that must be fitted by a caregiver. It is inserted into the vagina, along with a spermicidal jelly. It is inserted before intercourse. The diaphragm should be left in the vagina for 6 to 8 hours after intercourse.  · Cervical cap. This is a round, soft, latex or plastic cup that fits over the cervix and must be fitted by a caregiver. The cap can be left in place for up to 48 hours after intercourse.  · Sponge. This is a soft, circular piece of polyurethane foam. The sponge has spermicide in it. It is inserted into the vagina after wetting it and before sexual intercourse.  · Spermicides. These are chemicals that kill or block sperm from entering the cervix and uterus. They come in the form of creams, jellies, suppositories, foam, or tablets. They do not require a prescription. They are inserted into the vagina with an applicator before having sexual intercourse.   The process must be repeated every time you have sexual intercourse.  INTRAUTERINE CONTRACEPTION  · Intrauterine device (IUD). This is a T-shaped device that is put in a woman's uterus during a menstrual period to prevent pregnancy. There are 2 types:  · Copper IUD. This type of IUD is wrapped in copper wire and is placed inside the uterus. Copper makes the uterus and  fallopian tubes produce a fluid that kills sperm. It can stay in place for 10 years.  · Hormone IUD. This type of IUD contains the hormone progestin (synthetic progesterone). The hormone thickens the cervical mucus and prevents sperm from entering the uterus, and it also thins the uterine lining to prevent implantation of a fertilized egg. The hormone can weaken or kill the sperm that get into the uterus. It can stay in place for 5 years.  PERMANENT METHODS OF CONTRACEPTION  · Female tubal ligation. This is when the woman's fallopian tubes are surgically sealed, tied, or blocked to prevent the egg from traveling to the uterus.  · Female sterilization. This is when the female has the tubes that carry sperm tied off (vasectomy). This blocks sperm from entering the vagina during sexual intercourse. After the procedure, the man can still ejaculate fluid (semen).  NATURAL PLANNING METHODS  · Natural family planning. This is not having sexual intercourse or using a barrier method (condom, diaphragm, cervical cap) on days the woman could become pregnant.  · Calendar method. This is keeping track of the length of each menstrual cycle and identifying when you are fertile.  · Ovulation method. This is avoiding sexual intercourse during ovulation.  · Symptothermal method. This is avoiding sexual intercourse during ovulation, using a thermometer and ovulation symptoms.  · Post-ovulation method. This is timing sexual intercourse after you have ovulated.  Regardless of which type or method of contraception you choose, it is important that you use condoms to protect against the transmission of sexually transmitted diseases (STDs). Talk with your caregiver about which form of contraception is most appropriate for you.  Document Released: 03/24/2005 Document Revised: 06/16/2011 Document Reviewed: 07/31/2010  ExitCare® Patient Information ©2014 [employer].

## 2012-08-26 NOTE — Progress Notes (Signed)
P 101 Pt reports she is doing well

## 2012-08-27 ENCOUNTER — Encounter: Payer: Self-pay | Admitting: Obstetrics & Gynecology

## 2012-08-27 NOTE — Progress Notes (Signed)
Doing well 

## 2012-09-04 ENCOUNTER — Encounter: Payer: Self-pay | Admitting: Obstetrics & Gynecology

## 2012-09-09 ENCOUNTER — Ambulatory Visit (INDEPENDENT_AMBULATORY_CARE_PROVIDER_SITE_OTHER): Payer: 59 | Admitting: Obstetrics & Gynecology

## 2012-09-09 ENCOUNTER — Encounter: Payer: Self-pay | Admitting: Obstetrics & Gynecology

## 2012-09-09 VITALS — BP 119/77 | Temp 98.1°F | Wt 171.6 lb

## 2012-09-09 DIAGNOSIS — Z3480 Encounter for supervision of other normal pregnancy, unspecified trimester: Secondary | ICD-10-CM

## 2012-09-09 DIAGNOSIS — Z3482 Encounter for supervision of other normal pregnancy, second trimester: Secondary | ICD-10-CM

## 2012-09-09 LAB — POCT URINALYSIS DIPSTICK
Glucose, UA: NEGATIVE
Nitrite, UA: NEGATIVE
Urobilinogen, UA: NEGATIVE

## 2012-09-09 MED ORDER — PRENATAL MULTIVITAMIN CH: 1.0000 | ORAL_TABLET | Freq: Every day | ORAL | Status: DC

## 2012-09-09 NOTE — Progress Notes (Signed)
Pulse:109 

## 2012-09-09 NOTE — Progress Notes (Signed)
Doing well 

## 2012-09-09 NOTE — Patient Instructions (Signed)
Contraception Choices Contraception (birth control) is the use of any methods or devices to prevent pregnancy. Below are some methods to help avoid pregnancy. HORMONAL METHODS   Contraceptive implant. This is a thin, plastic tube containing progesterone hormone. It does not contain estrogen hormone. Your caregiver inserts the tube in the inner part of the upper arm. The tube can remain in place for up to 3 years. After 3 years, the implant must be removed. The implant prevents the ovaries from releasing an egg (ovulation), thickens the cervical mucus which prevents sperm from entering the uterus, and thins the lining of the inside of the uterus.  Progesterone-only injections. These injections are given every 3 months by your caregiver to prevent pregnancy. This synthetic progesterone hormone stops the ovaries from releasing eggs. It also thickens cervical mucus and changes the uterine lining. This makes it harder for sperm to survive in the uterus.  Birth control pills. These pills contain estrogen and progesterone hormone. They work by stopping the egg from forming in the ovary (ovulation). Birth control pills are prescribed by a caregiver.Birth control pills can also be used to treat heavy periods.  Minipill. This type of birth control pill contains only the progesterone hormone. They are taken every day of each month and must be prescribed by your caregiver.  Birth control patch. The patch contains hormones similar to those in birth control pills. It must be changed once a week and is prescribed by a caregiver.  Vaginal ring. The ring contains hormones similar to those in birth control pills. It is left in the vagina for 3 weeks, removed for 1 week, and then a new one is put back in place. The patient must be comfortable inserting and removing the ring from the vagina.A caregiver's prescription is necessary.  Emergency contraception. Emergency contraceptives prevent pregnancy after unprotected  sexual intercourse. This pill can be taken right after sex or up to 5 days after unprotected sex. It is most effective the sooner you take the pills after having sexual intercourse. Emergency contraceptive pills are available without a prescription. Check with your pharmacist. Do not use emergency contraception as your only form of birth control. BARRIER METHODS   Female condom. This is a thin sheath (latex or rubber) that is worn over the penis during sexual intercourse. It can be used with spermicide to increase effectiveness.  Female condom. This is a soft, loose-fitting sheath that is put into the vagina before sexual intercourse.  Diaphragm. This is a soft, latex, dome-shaped barrier that must be fitted by a caregiver. It is inserted into the vagina, along with a spermicidal jelly. It is inserted before intercourse. The diaphragm should be left in the vagina for 6 to 8 hours after intercourse.  Cervical cap. This is a round, soft, latex or plastic cup that fits over the cervix and must be fitted by a caregiver. The cap can be left in place for up to 48 hours after intercourse.  Sponge. This is a soft, circular piece of polyurethane foam. The sponge has spermicide in it. It is inserted into the vagina after wetting it and before sexual intercourse.  Spermicides. These are chemicals that kill or block sperm from entering the cervix and uterus. They come in the form of creams, jellies, suppositories, foam, or tablets. They do not require a prescription. They are inserted into the vagina with an applicator before having sexual intercourse. The process must be repeated every time you have sexual intercourse. INTRAUTERINE CONTRACEPTION  Intrauterine device (  IUD). This is a T-shaped device that is put in a woman's uterus during a menstrual period to prevent pregnancy. There are 2 types:  Copper IUD. This type of IUD is wrapped in copper wire and is placed inside the uterus. Copper makes the uterus and  fallopian tubes produce a fluid that kills sperm. It can stay in place for 10 years.  Hormone IUD. This type of IUD contains the hormone progestin (synthetic progesterone). The hormone thickens the cervical mucus and prevents sperm from entering the uterus, and it also thins the uterine lining to prevent implantation of a fertilized egg. The hormone can weaken or kill the sperm that get into the uterus. It can stay in place for 5 years. PERMANENT METHODS OF CONTRACEPTION  Female tubal ligation. This is when the woman's fallopian tubes are surgically sealed, tied, or blocked to prevent the egg from traveling to the uterus.  Female sterilization. This is when the female has the tubes that carry sperm tied off (vasectomy).This blocks sperm from entering the vagina during sexual intercourse. After the procedure, the man can still ejaculate fluid (semen). NATURAL PLANNING METHODS  Natural family planning. This is not having sexual intercourse or using a barrier method (condom, diaphragm, cervical cap) on days the woman could become pregnant.  Calendar method. This is keeping track of the length of each menstrual cycle and identifying when you are fertile.  Ovulation method. This is avoiding sexual intercourse during ovulation.  Symptothermal method. This is avoiding sexual intercourse during ovulation, using a thermometer and ovulation symptoms.  Post-ovulation method. This is timing sexual intercourse after you have ovulated. Regardless of which type or method of contraception you choose, it is important that you use condoms to protect against the transmission of sexually transmitted diseases (STDs). Talk with your caregiver about which form of contraception is most appropriate for you. Document Released: 03/24/2005 Document Revised: 06/16/2011 Document Reviewed: 07/31/2010 ExitCare Patient Information 2014 [employer]. Tetanus, Diphtheria, Pertussis (Tdap) Vaccine What You Need to Know WHY GET  VACCINATED? Tetanus, diphtheria and pertussis can be very serious diseases, even for adolescents and adults. Tdap vaccine can protect us from these diseases. TETANUS (Lockjaw) causes painful muscle tightening and stiffness, usually all over the body.  It can lead to tightening of muscles in the head and neck so you can't open your mouth, swallow, or sometimes even breathe. Tetanus kills about 1 out of 5 people who are infected. DIPHTHERIA can cause a thick coating to form in the back of the throat.  It can lead to breathing problems, paralysis, heart failure, and death. PERTUSSIS (Whooping Cough) causes severe coughing spells, which can cause difficulty breathing, vomiting and disturbed sleep.  It can also lead to weight loss, incontinence, and rib fractures. Up to 2 in 100 adolescents and 5 in 100 adults with pertussis are hospitalized or have complications, which could include pneumonia and death. These diseases are caused by bacteria. Diphtheria and pertussis are spread from person to person through coughing or sneezing. Tetanus enters the body through cuts, scratches, or wounds. Before vaccines, the [place] saw as many as 200,000 cases a year of diphtheria and pertussis, and hundreds of cases of tetanus. Since vaccination began, tetanus and diphtheria have dropped by about 99% and pertussis by about 80%. TDAP VACCINE Tdap vaccine can protect adolescents and adults from tetanus, diphtheria, and pertussis. One dose of Tdap is routinely given at age 11 or 12. People who did not get Tdap at that age should get   it as soon as possible. Tdap is especially important for health care professionals and anyone having close contact with a baby younger than 12 months. Pregnant women should get a dose of Tdap during every pregnancy, to protect the newborn from pertussis. Infants are most at risk for severe, life-threatening complications from pertussis. A similar vaccine, called Td, protects from tetanus  and diphtheria, but not pertussis. A Td booster should be given every 10 years. Tdap may be given as one of these boosters if you have not already gotten a dose. Tdap may also be given after a severe cut or burn to prevent tetanus infection. Your doctor can give you more information. Tdap may safely be given at the same time as other vaccines. SOME PEOPLE SHOULD NOT GET THIS VACCINE  If you ever had a life-threatening allergic reaction after a dose of any tetanus, diphtheria, or pertussis containing vaccine, OR if you have a severe allergy to any part of this vaccine, you should not get Tdap. Tell your doctor if you have any severe allergies.  If you had a coma, or long or multiple seizures within 7 days after a childhood dose of DTP or DTaP, you should not get Tdap, unless a cause other than the vaccine was found. You can still get Td.  Talk to your doctor if you:  have epilepsy or another nervous system problem,  had severe pain or swelling after any vaccine containing diphtheria, tetanus or pertussis,  ever had Guillain-Barr Syndrome (GBS),  aren't feeling well on the day the shot is scheduled. RISKS OF A VACCINE REACTION With any medicine, including vaccines, there is a chance of side effects. These are usually mild and go away on their own, but serious reactions are also possible. Brief fainting spells can follow a vaccination, leading to injuries from falling. Sitting or lying down for about 15 minutes can help prevent these. Tell your doctor if you feel dizzy or light-headed, or have vision changes or ringing in the ears. Mild problems following Tdap (Did not interfere with activities)  Pain where the shot was given (about 3 in 4 adolescents or 2 in 3 adults)  Redness or swelling where the shot was given (about 1 person in 5)  Mild fever of at least 100.4F (up to about 1 in 25 adolescents or 1 in 100 adults)  Headache (about 3 or 4 people in 10)  Tiredness (about 1 person in 3  or 4)  Nausea, vomiting, diarrhea, stomach ache (up to 1 in 4 adolescents or 1 in 10 adults)  Chills, body aches, sore joints, rash, swollen glands (uncommon) Moderate problems following Tdap (Interfered with activities, but did not require medical attention)  Pain where the shot was given (about 1 in 5 adolescents or 1 in 100 adults)  Redness or swelling where the shot was given (up to about 1 in 16 adolescents or 1 in 25 adults)  Fever over 102F (about 1 in 100 adolescents or 1 in 250 adults)  Headache (about 3 in 20 adolescents or 1 in 10 adults)  Nausea, vomiting, diarrhea, stomach ache (up to 1 or 3 people in 100)  Swelling of the entire arm where the shot was given (up to about 3 in 100). Severe problems following Tdap (Unable to perform usual activities, required medical attention)  Swelling, severe pain, bleeding and redness in the arm where the shot was given (rare). A severe allergic reaction could occur after any vaccine (estimated less than 1 in a million   doses). WHAT IF THERE IS A SERIOUS REACTION? What should I look for?  Look for anything that concerns you, such as signs of a severe allergic reaction, very high fever, or behavior changes. Signs of a severe allergic reaction can include hives, swelling of the face and throat, difficulty breathing, a fast heartbeat, dizziness, and weakness. These would start a few minutes to a few hours after the vaccination. What should I do?  If you think it is a severe allergic reaction or other emergency that can't wait, call 9-1-1 or get the person to the nearest hospital. Otherwise, call your doctor.  Afterward, the reaction should be reported to the "Vaccine Adverse Event Reporting System" (VAERS). Your doctor might file this report, or you can do it yourself through the VAERS web site at www.vaers.hhs.gov, or by calling 1-800-822-7967. VAERS is only for reporting reactions. They do not give medical advice.  THE NATIONAL VACCINE  INJURY COMPENSATION PROGRAM The National Vaccine Injury Compensation Program (VICP) is a federal program that was created to compensate people who may have been injured by certain vaccines. Persons who believe they may have been injured by a vaccine can learn about the program and about filing a claim by calling 1-800-338-2382 or visiting the VICP website at www.hrsa.gov/vaccinecompensation. HOW CAN I LEARN MORE?  Ask your doctor.  Call your local or state health department.  Contact the Centers for Disease Control and Prevention (CDC):  Call 1-800-232-4636 or visit CDC's website at www.cdc.gov/vaccines. CDC Tdap Vaccine VIS (08/14/11) Document Released: 09/23/2011 Document Revised: 12/17/2011 Document Reviewed: 09/23/2011 ExitCare Patient Information 2014 [employer].  

## 2012-09-23 ENCOUNTER — Ambulatory Visit (INDEPENDENT_AMBULATORY_CARE_PROVIDER_SITE_OTHER): Payer: 59 | Admitting: Obstetrics & Gynecology

## 2012-09-23 ENCOUNTER — Encounter: Payer: Self-pay | Admitting: Obstetrics & Gynecology

## 2012-09-23 VITALS — BP 117/76 | Temp 97.4°F | Wt 174.0 lb

## 2012-09-23 DIAGNOSIS — Z3480 Encounter for supervision of other normal pregnancy, unspecified trimester: Secondary | ICD-10-CM

## 2012-09-23 DIAGNOSIS — Z3483 Encounter for supervision of other normal pregnancy, third trimester: Secondary | ICD-10-CM

## 2012-09-23 LAB — POCT URINALYSIS DIPSTICK
Urobilinogen, UA: NEGATIVE
pH, UA: 5

## 2012-09-23 NOTE — Patient Instructions (Signed)

## 2012-09-23 NOTE — Progress Notes (Signed)
Pulse 111  

## 2012-09-23 NOTE — Progress Notes (Signed)
Plans COCP/bottle feeding.

## 2012-10-11 ENCOUNTER — Ambulatory Visit (INDEPENDENT_AMBULATORY_CARE_PROVIDER_SITE_OTHER): Payer: 59 | Admitting: Obstetrics & Gynecology

## 2012-10-11 ENCOUNTER — Encounter: Payer: Self-pay | Admitting: Obstetrics & Gynecology

## 2012-10-11 VITALS — BP 115/76 | Temp 97.8°F | Wt 179.0 lb

## 2012-10-11 DIAGNOSIS — Z3483 Encounter for supervision of other normal pregnancy, third trimester: Secondary | ICD-10-CM

## 2012-10-11 DIAGNOSIS — N390 Urinary tract infection, site not specified: Secondary | ICD-10-CM

## 2012-10-11 DIAGNOSIS — Z3480 Encounter for supervision of other normal pregnancy, unspecified trimester: Secondary | ICD-10-CM

## 2012-10-11 LAB — POCT URINALYSIS DIPSTICK
Blood, UA: NEGATIVE
Ketones, UA: NEGATIVE
Nitrite, UA: POSITIVE
Protein, UA: NEGATIVE
pH, UA: 5

## 2012-10-11 NOTE — Addendum Note (Signed)
Addended by: George Hugh on: 10/11/2012 05:17 PM   Modules accepted: Orders

## 2012-10-11 NOTE — Progress Notes (Signed)
Pulse: 106

## 2012-10-11 NOTE — Patient Instructions (Signed)
Patient information: Group B streptococcus and pregnancy (Beyond the Basics)  Authors Karen M Puopolo, MD, PhD Carol J Baker, MD Section Editors Charles J Lockwood, MD Daniel J Sexton, MD Deputy Editor Vanessa A Barss, MD Disclosures  All topics are updated as new evidence becomes available and our peer review process is complete.  Literature review current through: Feb 2014.  This topic last updated: Oct 06, 2011.  INTRODUCTION - Group B streptococcus (GBS) is a bacterium that can cause serious infections in pregnant women and newborn babies. GBS is one of many types of streptococcal bacteria, sometimes called "strep." This article discusses GBS, its effect on pregnant women and infants, and ways to prevent complications of GBS. More detailed information about GBS is available by subscription. (See "Group B streptococcal infection in pregnant women".) WHAT IS GROUP B STREP INFECTION? - GBS is commonly found in the digestive system and the vagina. In healthy adults, GBS is not harmful and does not cause problems. But in pregnant women and newborn infants, being infected with GBS can cause serious illness. Approximately one in three to four pregnant women in the [place] carries GBS in their gastrointestinal system and/or in their vagina. Carrying GBS is not the same as being infected. Carriers are not sick and do not need treatment during pregnancy. There is no treatment that can stop you from carrying GBS.  Pregnant women who are carriers of GBS infrequently become infected with GBS. GBS can cause urinary tract infections, infection of the amniotic fluid (bag of water), and infection of the uterus after delivery. GBS infections during pregnancy may lead to preterm labor.  Pregnant women who carry GBS can pass on the bacteria to their newborns, and some of those babies become infected with GBS. Newborns who are infected with GBS can develop pneumonia (lung infection), septicemia (blood infection), or  meningitis (infection of the lining of the brain and spinal cord). These complications can be prevented by giving intravenous antibiotics during labor to any woman who is at risk of GBS infection. You are at risk of GBS infection if: You have a urine culture during your current pregnancy showing GBS  You have a vaginal and rectal culture during your current pregnancy showing GBS  You had an infant infected with GBS in the past GROUP B STREP PREVENTION - Most doctors and nurses recommend a urine culture early in your pregnancy to be sure that you do not have a bladder infection without symptoms. If you urine culture shows GBS or other bacteria, you may be treated with an antibiotic. If you have symptoms of urinary infection, such as pain with urination, any time during your pregnancy, a urine culture is done. If GBS grows from the urine culture, it should be treated with an antibiotic, and you should also receive intravenous antibiotics during labor. Expert groups recommend that all pregnant women have a GBS culture at 35 to 37 weeks of pregnancy. The culture is done by swabbing the vagina and rectum. If your GBS culture is positive, you will be given an intravenous antibiotic during labor. If you have preterm labor, the culture is done then and an intravenous antibiotic is given until the baby is born or the labor is stopped by your health care provider. If you have a positive GBS culture and you have an allergy to penicillin, be sure your doctor and nurse are aware of this allergy and tell them what happened with the allergy. If you had only a rash or itching, this   is not a serious allergy, and you can receive a common drug related to the penicillin. If you had a serious allergy (for example, trouble breathing, swelling of your face) you may need an additional test to determine which antibiotic should be used during labor. Being treated with an antibiotic during labor greatly reduces the chance that you or  your newborn will develop infections related to GBS. It is important to note that young infants up to age 3 months can also develop septicemia, meningitis and other serious infections from GBS. Being treated with an antibiotic during labor does not reduce the chance that your baby will develop this later type of infection. There is currently no known way of preventing this later-onset GBS disease. WHERE TO GET MORE INFORMATION - Your healthcare provider is the best source of information for questions and concerns related to your medical problem.  

## 2012-10-11 NOTE — Progress Notes (Signed)
Doing well 

## 2012-10-13 LAB — URINE CULTURE

## 2012-10-25 ENCOUNTER — Ambulatory Visit (INDEPENDENT_AMBULATORY_CARE_PROVIDER_SITE_OTHER): Payer: 59 | Admitting: Obstetrics & Gynecology

## 2012-10-25 ENCOUNTER — Encounter: Payer: Self-pay | Admitting: Obstetrics & Gynecology

## 2012-10-25 VITALS — BP 119/77 | Temp 98.1°F | Wt 181.2 lb

## 2012-10-25 DIAGNOSIS — Z3480 Encounter for supervision of other normal pregnancy, unspecified trimester: Secondary | ICD-10-CM

## 2012-10-25 DIAGNOSIS — N390 Urinary tract infection, site not specified: Secondary | ICD-10-CM

## 2012-10-25 DIAGNOSIS — Z3483 Encounter for supervision of other normal pregnancy, third trimester: Secondary | ICD-10-CM

## 2012-10-25 LAB — POCT URINALYSIS DIPSTICK
Bilirubin, UA: NEGATIVE
Blood, UA: NEGATIVE
Ketones, UA: NEGATIVE
pH, UA: 6

## 2012-10-25 LAB — OB RESULTS CONSOLE GC/CHLAMYDIA
Chlamydia: NEGATIVE
Gonorrhea: NEGATIVE

## 2012-10-25 NOTE — Progress Notes (Signed)
Pulse: 102

## 2012-10-26 ENCOUNTER — Encounter: Payer: Self-pay | Admitting: Obstetrics & Gynecology

## 2012-10-26 NOTE — Progress Notes (Signed)
Doing well 

## 2012-10-26 NOTE — Patient Instructions (Signed)
Patient information: Group B streptococcus and pregnancy (Beyond the Basics)  Authors Karen M Puopolo, MD, PhD Carol J Baker, MD Section Editors Charles J Lockwood, MD Daniel J Sexton, MD Deputy Editor Vanessa A Barss, MD Disclosures  All topics are updated as new evidence becomes available and our peer review process is complete.  Literature review current through: Feb 2014.  This topic last updated: Oct 06, 2011.  INTRODUCTION - Group B streptococcus (GBS) is a bacterium that can cause serious infections in pregnant women and newborn babies. GBS is one of many types of streptococcal bacteria, sometimes called "strep." This article discusses GBS, its effect on pregnant women and infants, and ways to prevent complications of GBS. More detailed information about GBS is available by subscription. (See "Group B streptococcal infection in pregnant women".) WHAT IS GROUP B STREP INFECTION? - GBS is commonly found in the digestive system and the vagina. In healthy adults, GBS is not harmful and does not cause problems. But in pregnant women and newborn infants, being infected with GBS can cause serious illness. Approximately one in three to four pregnant women in the [place] carries GBS in their gastrointestinal system and/or in their vagina. Carrying GBS is not the same as being infected. Carriers are not sick and do not need treatment during pregnancy. There is no treatment that can stop you from carrying GBS.  Pregnant women who are carriers of GBS infrequently become infected with GBS. GBS can cause urinary tract infections, infection of the amniotic fluid (bag of water), and infection of the uterus after delivery. GBS infections during pregnancy may lead to preterm labor.  Pregnant women who carry GBS can pass on the bacteria to their newborns, and some of those babies become infected with GBS. Newborns who are infected with GBS can develop pneumonia (lung infection), septicemia (blood infection), or  meningitis (infection of the lining of the brain and spinal cord). These complications can be prevented by giving intravenous antibiotics during labor to any woman who is at risk of GBS infection. You are at risk of GBS infection if: You have a urine culture during your current pregnancy showing GBS  You have a vaginal and rectal culture during your current pregnancy showing GBS  You had an infant infected with GBS in the past GROUP B STREP PREVENTION - Most doctors and nurses recommend a urine culture early in your pregnancy to be sure that you do not have a bladder infection without symptoms. If you urine culture shows GBS or other bacteria, you may be treated with an antibiotic. If you have symptoms of urinary infection, such as pain with urination, any time during your pregnancy, a urine culture is done. If GBS grows from the urine culture, it should be treated with an antibiotic, and you should also receive intravenous antibiotics during labor. Expert groups recommend that all pregnant women have a GBS culture at 35 to 37 weeks of pregnancy. The culture is done by swabbing the vagina and rectum. If your GBS culture is positive, you will be given an intravenous antibiotic during labor. If you have preterm labor, the culture is done then and an intravenous antibiotic is given until the baby is born or the labor is stopped by your health care provider. If you have a positive GBS culture and you have an allergy to penicillin, be sure your doctor and nurse are aware of this allergy and tell them what happened with the allergy. If you had only a rash or itching, this   is not a serious allergy, and you can receive a common drug related to the penicillin. If you had a serious allergy (for example, trouble breathing, swelling of your face) you may need an additional test to determine which antibiotic should be used during labor. Being treated with an antibiotic during labor greatly reduces the chance that you or  your newborn will develop infections related to GBS. It is important to note that young infants up to age 3 months can also develop septicemia, meningitis and other serious infections from GBS. Being treated with an antibiotic during labor does not reduce the chance that your baby will develop this later type of infection. There is currently no known way of preventing this later-onset GBS disease. WHERE TO GET MORE INFORMATION - Your healthcare provider is the best source of information for questions and concerns related to your medical problem.  

## 2012-10-27 LAB — URINE CULTURE
Colony Count: NO GROWTH
Organism ID, Bacteria: NO GROWTH

## 2012-10-27 LAB — STREP B DNA PROBE: GBSP: NEGATIVE

## 2012-11-04 ENCOUNTER — Encounter: Payer: Self-pay | Admitting: Obstetrics

## 2012-11-04 ENCOUNTER — Encounter: Payer: 59 | Admitting: Obstetrics & Gynecology

## 2012-11-04 ENCOUNTER — Ambulatory Visit (INDEPENDENT_AMBULATORY_CARE_PROVIDER_SITE_OTHER): Payer: 59 | Admitting: Obstetrics

## 2012-11-04 VITALS — BP 129/81 | Temp 97.5°F | Wt 184.0 lb

## 2012-11-04 DIAGNOSIS — Z3480 Encounter for supervision of other normal pregnancy, unspecified trimester: Secondary | ICD-10-CM

## 2012-11-04 DIAGNOSIS — Z3483 Encounter for supervision of other normal pregnancy, third trimester: Secondary | ICD-10-CM

## 2012-11-04 DIAGNOSIS — N390 Urinary tract infection, site not specified: Secondary | ICD-10-CM | POA: Insufficient documentation

## 2012-11-04 LAB — POCT URINALYSIS DIPSTICK
Glucose, UA: NEGATIVE
Nitrite, UA: POSITIVE
Spec Grav, UA: 1.02
Urobilinogen, UA: NEGATIVE

## 2012-11-04 MED ORDER — CEPHALEXIN 500 MG PO CAPS: 500.0000 mg | ORAL_CAPSULE | Freq: Four times a day (QID) | ORAL | Status: DC

## 2012-11-04 NOTE — Progress Notes (Signed)
Pulse-108 No complaints. [name] patient.

## 2012-11-06 LAB — CULTURE, OB URINE: Colony Count: >=100000

## 2012-11-11 ENCOUNTER — Encounter: Payer: Self-pay | Admitting: Obstetrics & Gynecology

## 2012-11-11 ENCOUNTER — Ambulatory Visit (INDEPENDENT_AMBULATORY_CARE_PROVIDER_SITE_OTHER): Payer: 59 | Admitting: Obstetrics & Gynecology

## 2012-11-11 VITALS — BP 132/80 | Temp 97.8°F | Wt 188.0 lb

## 2012-11-11 DIAGNOSIS — Z3483 Encounter for supervision of other normal pregnancy, third trimester: Secondary | ICD-10-CM

## 2012-11-11 DIAGNOSIS — Z3480 Encounter for supervision of other normal pregnancy, unspecified trimester: Secondary | ICD-10-CM

## 2012-11-11 LAB — POCT URINALYSIS DIPSTICK
Bilirubin, UA: NEGATIVE
Leukocytes, UA: NEGATIVE
Spec Grav, UA: 1.015
pH, UA: 7

## 2012-11-11 NOTE — Progress Notes (Signed)
Occasional fetal PACs.

## 2012-11-11 NOTE — Progress Notes (Signed)
Pulse-104 No complaints 

## 2012-11-18 ENCOUNTER — Ambulatory Visit (INDEPENDENT_AMBULATORY_CARE_PROVIDER_SITE_OTHER): Payer: 59 | Admitting: Obstetrics & Gynecology

## 2012-11-18 VITALS — BP 125/84 | Temp 97.5°F | Wt 188.0 lb

## 2012-11-18 DIAGNOSIS — Z3480 Encounter for supervision of other normal pregnancy, unspecified trimester: Secondary | ICD-10-CM

## 2012-11-18 DIAGNOSIS — Z3483 Encounter for supervision of other normal pregnancy, third trimester: Secondary | ICD-10-CM

## 2012-11-18 LAB — POCT URINALYSIS DIPSTICK
Bilirubin, UA: NEGATIVE
Blood, UA: NEGATIVE
Glucose, UA: NEGATIVE
Ketones, UA: NEGATIVE
Leukocytes, UA: NEGATIVE
Nitrite, UA: NEGATIVE

## 2012-11-18 NOTE — Progress Notes (Signed)
Pulse-105 Pt c/o intermittent lower back pain today.

## 2012-11-25 ENCOUNTER — Ambulatory Visit (INDEPENDENT_AMBULATORY_CARE_PROVIDER_SITE_OTHER): Payer: 59 | Admitting: Obstetrics

## 2012-11-25 VITALS — BP 117/78 | Temp 97.9°F | Wt 192.0 lb

## 2012-11-25 DIAGNOSIS — Z3480 Encounter for supervision of other normal pregnancy, unspecified trimester: Secondary | ICD-10-CM

## 2012-11-25 DIAGNOSIS — Z3483 Encounter for supervision of other normal pregnancy, third trimester: Secondary | ICD-10-CM

## 2012-11-25 LAB — POCT URINALYSIS DIPSTICK
Glucose, UA: NEGATIVE
Nitrite, UA: NEGATIVE
Spec Grav, UA: 1.01

## 2012-11-25 NOTE — Progress Notes (Signed)
Pt states she is having lower abdomen pain.

## 2012-11-26 ENCOUNTER — Encounter: Payer: Self-pay | Admitting: Obstetrics

## 2012-11-26 ENCOUNTER — Inpatient Hospital Stay (HOSPITAL_COMMUNITY)
Admission: AD | Admit: 2012-11-26 | Discharge: 2012-11-26 | Disposition: A | Discharge status: Home / Self Care | Payer: 59 | Source: Ambulatory Visit | Attending: Obstetrics | Admitting: Obstetrics

## 2012-11-26 ENCOUNTER — Encounter (HOSPITAL_COMMUNITY): Payer: Self-pay | Admitting: *Deleted

## 2012-11-26 DIAGNOSIS — O99891 Other specified diseases and conditions complicating pregnancy: Secondary | ICD-10-CM | POA: Insufficient documentation

## 2012-11-26 DIAGNOSIS — M545 Low back pain, unspecified: Secondary | ICD-10-CM | POA: Insufficient documentation

## 2012-11-26 LAB — POCT FERN TEST: POCT Fern Test: NEGATIVE

## 2012-11-26 NOTE — MAU Note (Signed)
Patient [name] [redacted]w[redacted]d presents today with c/o lower back pain and pelvic pressure. States has been contracting since 3 am this morning but started to wear off around 6 am. Denies bleeding. States does feel like leaking fluid but not having to wear a pad at this time. Advised by office to come to MAU for evaluation. Reports good fetal movement.

## 2012-11-26 NOTE — MAU Provider Note (Signed)
VENDA DICE is 27 y.o. G2P1001 [redacted]w[redacted]d weeks presenting with ? Rupture of membranes.  Voided X 4 today while shopping at [facility]. She denies contractions and vaginal bleeding.  Patient of Dr. Verdell Carmine.   On speculum exam  No pooling of fluid.  Cervical exam by Christina, RN--1cm dilated and 50% effaced.  FERN -NEGATIVE.  She will report to Dr. Clearance Coots.

## 2012-11-29 ENCOUNTER — Ambulatory Visit (INDEPENDENT_AMBULATORY_CARE_PROVIDER_SITE_OTHER): Payer: 59 | Admitting: Obstetrics & Gynecology

## 2012-11-29 VITALS — BP 124/79 | Temp 98.0°F | Wt 191.0 lb

## 2012-11-29 DIAGNOSIS — O480 Post-term pregnancy: Secondary | ICD-10-CM

## 2012-11-29 DIAGNOSIS — Z3480 Encounter for supervision of other normal pregnancy, unspecified trimester: Secondary | ICD-10-CM

## 2012-11-29 DIAGNOSIS — Z3483 Encounter for supervision of other normal pregnancy, third trimester: Secondary | ICD-10-CM

## 2012-11-29 LAB — POCT URINALYSIS DIPSTICK
Nitrite, UA: NEGATIVE
Protein, UA: NEGATIVE
pH, UA: 7

## 2012-11-29 NOTE — Progress Notes (Signed)
P- 109 Pt states she is having lower abdomen pain and pressure

## 2012-11-30 ENCOUNTER — Encounter (HOSPITAL_COMMUNITY): Payer: Self-pay | Admitting: *Deleted

## 2012-11-30 ENCOUNTER — Other Ambulatory Visit: Payer: Self-pay | Admitting: *Deleted

## 2012-11-30 ENCOUNTER — Telehealth (HOSPITAL_COMMUNITY): Payer: Self-pay | Admitting: *Deleted

## 2012-11-30 ENCOUNTER — Encounter: Payer: Self-pay | Admitting: Obstetrics & Gynecology

## 2012-11-30 NOTE — Telephone Encounter (Signed)
Preadmission screen  

## 2012-11-30 NOTE — Patient Instructions (Signed)
Labor Induction  Most women go into labor on their own between 37 and 42 weeks of the pregnancy. When this does not happen or when there is a medical need, medicine or other methods may be used to induce labor. Labor induction causes a pregnant woman's uterus to contract. It also causes the cervix to soften (ripen), open (dilate), and thin out (efface). Usually, labor is not induced before 39 weeks of the pregnancy unless there is a problem with the baby or mother. Whether your labor will be induced depends on a number of factors, including the following:  The medical condition of you and the baby.  How many weeks along you are.  The status of baby's lung maturity.  The condition of the cervix.  The position of the baby. REASONS FOR LABOR INDUCTION  The health of the baby or mother is at risk.  The pregnancy is overdue by 1 week or more.  The water breaks but labor does not start on its own.  The mother has a health condition or serious illness such as high blood pressure, infection, placental abruption, or diabetes.  The amniotic fluid amounts are low around the baby.  The baby is distressed. REASONS TO NOT INDUCE LABOR Labor induction may not be a good idea if:  It is shown that your baby does not tolerate labor.  An induction is just more convenient.  You want the baby to be born on a certain date, like a holiday.  You have had previous surgeries on your uterus, such as a myomectomy or the removal of fibroids.  Your placenta lies very low in the uterus and blocks the opening of the cervix (placenta previa).  Your baby is not in a head down position.  The umbilical cord drops down into the birth canal in front of the baby. This could cut off the baby's blood and oxygen supply.  You have had a previous cesarean delivery.  There areunusual circumstances, such as the baby being extremely premature. RISKS AND COMPLICATIONS Problems may occur in the process of induction  and plans may need to be modified as a situation unfolds. Some of the risks of induction include:  Change in fetal heart rate, such as too high, too low, or erratic.  Risk of fetal distress.  Risk of infection to mother and baby.  Increased chance of having a cesarean delivery.  The rare, but increased chance that the placenta will separate from the uterus (abruption).  Uterine rupture (very rare). When induction is needed for medical reasons, the benefits of induction may outweigh the risks. BEFORE THE PROCEDURE Your caregiver will check your cervix and the baby's position. This will help your caregiver decide if you are far enough along for an induction to work. PROCEDURE Several methods of labor induction may be used, such as:   Taking prostaglandin medicine to dilate and ripen the cervix. The medicine will also start contractions. It can be taken by mouth or by inserting a suppository into the vagina.  A thin tube (catheter) with a balloon on the end may be inserted into your vagina to dilate the cervix. Once inserted, the balloon expands with water, which causes the cervix to open.  Striping the membranes. Your caregiver inserts a finger between the cervix and membranes, which causes the cervix to be stretched and may cause the uterus to contract. This is often done during an office visit. You will be sent home to wait for the contractions to begin. You will   then come in for an induction.  Breaking the water. Your caregiver will make a hole in the amniotic sac using a small instrument. Once the amniotic sac breaks, contractions should begin. This may still take hours to see an effect.  Taking medicine to trigger or strengthen contractions. This medicine is given intravenously through a tube in your arm. All of the methods of induction, besides stripping the membranes, will be done in the hospital. Induction is done in the hospital so that you and the baby can be carefully  monitored. AFTER THE PROCEDURE Some inductions can take up to 2 or 3 days. Depending on the cervix, it usually takes less time. It takes longer when you are induced early in the pregnancy or if this is your first pregnancy. If a mother is still pregnant and the induction has been going on for 2 to 3 days, either the mother will be sent home or a cesarean delivery will be needed. Document Released: 08/13/2006 Document Revised: 06/16/2011 Document Reviewed: 01/27/2011 [employer] Patient Information 2014 [employer].  

## 2012-11-30 NOTE — Progress Notes (Signed)
See below

## 2012-12-03 ENCOUNTER — Encounter: Payer: Self-pay | Admitting: Obstetrics & Gynecology

## 2012-12-03 ENCOUNTER — Ambulatory Visit (HOSPITAL_COMMUNITY)
Admission: RE | Admit: 2012-12-03 | Discharge: 2012-12-03 | Disposition: A | Discharge status: Home / Self Care | Payer: 59 | Source: Ambulatory Visit | Attending: Obstetrics & Gynecology | Admitting: Obstetrics & Gynecology

## 2012-12-03 DIAGNOSIS — Z3689 Encounter for other specified antenatal screening: Secondary | ICD-10-CM | POA: Insufficient documentation

## 2012-12-03 DIAGNOSIS — O480 Post-term pregnancy: Secondary | ICD-10-CM | POA: Insufficient documentation

## 2012-12-03 DIAGNOSIS — O283 Abnormal ultrasonic finding on antenatal screening of mother: Secondary | ICD-10-CM | POA: Insufficient documentation

## 2012-12-05 ENCOUNTER — Inpatient Hospital Stay (HOSPITAL_COMMUNITY)
Admission: RE | Admit: 2012-12-05 | Discharge: 2012-12-07 | DRG: 775 | Disposition: A | Discharge status: Home / Self Care | Payer: 59 | Source: Ambulatory Visit | Attending: Obstetrics & Gynecology | Admitting: Obstetrics & Gynecology

## 2012-12-05 ENCOUNTER — Encounter (HOSPITAL_COMMUNITY): Payer: Self-pay

## 2012-12-05 VITALS — BP 111/74 | HR 98 | Temp 98.0°F | Resp 20 | Ht 62.0 in | Wt 192.0 lb

## 2012-12-05 DIAGNOSIS — O283 Abnormal ultrasonic finding on antenatal screening of mother: Secondary | ICD-10-CM

## 2012-12-05 DIAGNOSIS — O480 Post-term pregnancy: Principal | ICD-10-CM | POA: Diagnosis not present

## 2012-12-05 LAB — COMPREHENSIVE METABOLIC PANEL
ALT: 6 U/L (ref 0–35)
AST: 13 U/L (ref 0–37)
Calcium: 9.2 mg/dL (ref 8.4–10.5)
GFR calc Af Amer: >90 mL/min (ref >90)
Sodium: 135 mEq/L (ref 135–145)
Total Protein: 5.6 g/dL — ABNORMAL LOW (ref 6.0–8.3)

## 2012-12-05 LAB — TYPE AND SCREEN: Antibody Screen: NEGATIVE

## 2012-12-05 LAB — CBC
HCT: 35.9 % — ABNORMAL LOW (ref 36.0–46.0)
MCV: 81.6 fL (ref 78.0–100.0)
RBC: 4.4 MIL/uL (ref 3.87–5.11)
WBC: 8.9 10*3/uL (ref 4.0–10.5)

## 2012-12-05 LAB — LACTATE DEHYDROGENASE: LDH: 154 U/L (ref 94–250)

## 2012-12-05 MED ORDER — HYDROXYZINE HCL 50 MG PO TABS
50.0000 mg | ORAL_TABLET | Freq: Four times a day (QID) | ORAL | Status: DC | PRN
  Filled 2012-12-05: qty 1

## 2012-12-05 MED ORDER — IBUPROFEN 600 MG PO TABS
600.0000 mg | ORAL_TABLET | Freq: Four times a day (QID) | ORAL | Status: DC
  Administered 2012-12-06 (×5): 600 mg via ORAL
  Filled 2012-12-05 (×6): qty 1

## 2012-12-05 MED ORDER — LACTATED RINGERS IV SOLN
INTRAVENOUS | Status: DC
  Administered 2012-12-05 (×2): via INTRAVENOUS

## 2012-12-05 MED ORDER — OXYCODONE-ACETAMINOPHEN 5-325 MG PO TABS: 1.0000 | ORAL_TABLET | ORAL | Status: DC | PRN

## 2012-12-05 MED ORDER — ONDANSETRON HCL 4 MG/2ML IJ SOLN: 4.0000 mg | Freq: Four times a day (QID) | INTRAMUSCULAR | Status: DC | PRN

## 2012-12-05 MED ORDER — LIDOCAINE HCL (PF) 1 % IJ SOLN
30.0000 mL | INTRAMUSCULAR | Status: DC | PRN
  Filled 2012-12-05 (×2): qty 30

## 2012-12-05 MED ORDER — TETANUS-DIPHTH-ACELL PERTUSSIS 5-2.5-18.5 LF-MCG/0.5 IM SUSP
0.5000 mL | Freq: Once | INTRAMUSCULAR | Status: AC
  Administered 2012-12-06: 0.5 mL via INTRAMUSCULAR

## 2012-12-05 MED ORDER — DIBUCAINE 1 % RE OINT: 1.0000 "application " | TOPICAL_OINTMENT | RECTAL | Status: DC | PRN

## 2012-12-05 MED ORDER — ONDANSETRON HCL 4 MG/2ML IJ SOLN: 4.0000 mg | INTRAMUSCULAR | Status: DC | PRN

## 2012-12-05 MED ORDER — SENNOSIDES-DOCUSATE SODIUM 8.6-50 MG PO TABS
2.0000 | ORAL_TABLET | Freq: Every day | ORAL | Status: DC
  Administered 2012-12-05: 2 via ORAL

## 2012-12-05 MED ORDER — ZOLPIDEM TARTRATE 5 MG PO TABS: 5.0000 mg | ORAL_TABLET | Freq: Every evening | ORAL | Status: DC | PRN

## 2012-12-05 MED ORDER — WITCH HAZEL-GLYCERIN EX PADS: 1.0000 "application " | MEDICATED_PAD | CUTANEOUS | Status: DC | PRN

## 2012-12-05 MED ORDER — MEDROXYPROGESTERONE ACETATE 150 MG/ML IM SUSP: 150.0000 mg | INTRAMUSCULAR | Status: DC | PRN

## 2012-12-05 MED ORDER — MAGNESIUM HYDROXIDE 400 MG/5ML PO SUSP: 30.0000 mL | ORAL | Status: DC | PRN

## 2012-12-05 MED ORDER — TERBUTALINE SULFATE 1 MG/ML IJ SOLN: 0.2500 mg | Freq: Once | INTRAMUSCULAR | Status: DC | PRN

## 2012-12-05 MED ORDER — ACETAMINOPHEN 325 MG PO TABS: 650.0000 mg | ORAL_TABLET | ORAL | Status: DC | PRN

## 2012-12-05 MED ORDER — DIPHENHYDRAMINE HCL 25 MG PO CAPS: 25.0000 mg | ORAL_CAPSULE | Freq: Four times a day (QID) | ORAL | Status: DC | PRN

## 2012-12-05 MED ORDER — IBUPROFEN 600 MG PO TABS
600.0000 mg | ORAL_TABLET | Freq: Four times a day (QID) | ORAL | Status: DC | PRN
  Administered 2012-12-05: 600 mg via ORAL
  Filled 2012-12-05: qty 1

## 2012-12-05 MED ORDER — OXYTOCIN 40 UNITS IN LACTATED RINGERS INFUSION - SIMPLE MED: 62.5000 mL/h | INTRAVENOUS | Status: DC

## 2012-12-05 MED ORDER — LACTATED RINGERS IV SOLN: 500.0000 mL | INTRAVENOUS | Status: DC | PRN

## 2012-12-05 MED ORDER — OXYTOCIN BOLUS FROM INFUSION: 500.0000 mL | INTRAVENOUS | Status: DC

## 2012-12-05 MED ORDER — FERROUS SULFATE 325 (65 FE) MG PO TABS
325.0000 mg | ORAL_TABLET | Freq: Two times a day (BID) | ORAL | Status: DC
  Administered 2012-12-06 – 2012-12-07 (×3): 325 mg via ORAL
  Filled 2012-12-05 (×3): qty 1

## 2012-12-05 MED ORDER — MEASLES, MUMPS & RUBELLA VAC ~~LOC~~ INJ
0.5000 mL | INJECTION | Freq: Once | SUBCUTANEOUS | Status: AC
  Administered 2012-12-07: 0.5 mL via SUBCUTANEOUS
  Filled 2012-12-05 (×3): qty 0.5

## 2012-12-05 MED ORDER — OXYTOCIN 40 UNITS IN LACTATED RINGERS INFUSION - SIMPLE MED
1.0000 m[IU]/min | INTRAVENOUS | Status: DC
  Administered 2012-12-05: 2 m[IU]/min via INTRAVENOUS
  Administered 2012-12-05: 6 m[IU]/min via INTRAVENOUS
  Filled 2012-12-05: qty 1000

## 2012-12-05 MED ORDER — LANOLIN HYDROUS EX OINT: TOPICAL_OINTMENT | CUTANEOUS | Status: DC | PRN

## 2012-12-05 MED ORDER — BUTORPHANOL TARTRATE 1 MG/ML IJ SOLN
1.0000 mg | INTRAMUSCULAR | Status: DC | PRN
  Administered 2012-12-05 (×3): 1 mg via INTRAVENOUS
  Filled 2012-12-05 (×3): qty 1

## 2012-12-05 MED ORDER — BENZOCAINE-MENTHOL 20-0.5 % EX AERO
1.0000 "application " | INHALATION_SPRAY | CUTANEOUS | Status: DC | PRN
  Administered 2012-12-05: 1 via TOPICAL
  Filled 2012-12-05: qty 56

## 2012-12-05 MED ORDER — CITRIC ACID-SODIUM CITRATE 334-500 MG/5ML PO SOLN: 30.0000 mL | ORAL | Status: DC | PRN

## 2012-12-05 MED ORDER — PRENATAL MULTIVITAMIN CH
1.0000 | ORAL_TABLET | Freq: Every day | ORAL | Status: DC
  Administered 2012-12-06: 1 via ORAL
  Filled 2012-12-05: qty 1

## 2012-12-05 MED ORDER — ONDANSETRON HCL 4 MG PO TABS: 4.0000 mg | ORAL_TABLET | ORAL | Status: DC | PRN

## 2012-12-05 NOTE — H&P (Signed)
[name] is a 27 y.o. female presenting for induction of labor. Maternal Medical History:  Reason for admission: She presents for induction of labor secondary to being postterm.  Fetal activity: Perceived fetal activity is normal.    Prenatal complications: no prenatal complications Prenatal Complications - Diabetes: none.    OB History   Grav Para Term Preterm Abortions TAB SAB Ect Mult Living   2 1 1       1      Past Medical History  Diagnosis Date  . GERD (gastroesophageal reflux disease)   . Hx of percutaneous transcatheter closure of congenital VSD    Past Surgical History  Procedure Laterality Date  . Cardiac surgery      as a baby  . Tonsillectomy     Family History: family history includes Asthma in her cousin and paternal aunt; Cancer in her paternal grandmother; Congestive Heart Failure in her other and paternal uncle; Stroke in her paternal grandfather. Social History:  reports that she quit smoking about 2 years ago. She has never used smokeless tobacco. She reports that she does not drink alcohol or use illicit drugs.     Review of Systems  Constitutional: Negative for fever.  Eyes: Negative for blurred vision.  Respiratory: Negative for shortness of breath.   Gastrointestinal: Negative for vomiting.  Skin: Negative for rash.  Neurological: Negative for headaches.    Dilation: 3 Effacement (%): 70 Station: -2 Exam by:: Dr [name] Blood pressure 129/83, pulse 96, temperature 98.4 F (36.9 C), temperature source Oral, resp. rate 20, height 5\' 2"  (1.575 m), weight 192 lb (87.091 kg), last menstrual period 02/23/2012. Maternal Exam:  Abdomen: not evaluated.  Introitus: Normal vulva. Pelvis: adequate for delivery.   Cervix: Cervix evaluated by digital exam.     Fetal Exam Fetal Monitor Review: Variability: moderate (6-25 bpm).   Pattern: accelerations present and no decelerations.    Fetal State Assessment: Category I - tracings are  normal.     Physical Exam  Constitutional: She appears well-developed.  HENT:  Head: Normocephalic.  Neck: Neck supple. No thyromegaly present.  Cardiovascular: Normal rate and regular rhythm.   Respiratory: Breath sounds normal.  GI: Soft. Bowel sounds are normal.  Skin: No rash noted.   AROM for clear fluid Prenatal labs: ABO, Rh: --/--/A POS (08/31 0724) Antibody: NEG (08/31 0724) Rubella: Equivocal (01/27 0000) RPR: NON REAC (05/07 1323)  HBsAg: Negative (01/27 0000)  HIV: NON REACTIVE (05/07 1323)  GBS: NEGATIVE (07/21 1426)   Assessment/Plan: Primipara @ [redacted]w[redacted]d for IOL secondary to postterm.  Category I FHT.  Recent BPP w/prominent/dilated bowel loops of questionable clinical significance.  Admit Low dose Pitocin per protocol U/S findings to be followed postnatally   [name],[name] A 12/05/2012, 10:33 AM

## 2012-12-06 NOTE — Progress Notes (Signed)
Patient ID: [name], female   DOB: 18-Jan-1986, 27 y.o.   MRN: [id number] Post Partum Day 1 S/P induced vaginal RH status/Rubella reviewed.  Feeding: bottle Subjective: No HA, SOB, CP, F/C, breast symptoms. Normal vaginal bleeding, no clots.     Objective: BP 116/65  Pulse 101  Temp(Src) 98 F (36.7 C) (Oral)  Resp 20  Ht 5\' 2"  (1.575 m)  Wt 192 lb (87.091 kg)  BMI 35.11 kg/m2  SpO2 98%  LMP 02/23/2012   Physical Exam:  General: alert Lochia: appropriate Uterine Fundus: firm DVT Evaluation: No evidence of DVT seen on physical exam. Ext: No c/c/e  Recent Labs  12/05/12 0724  HGB 12.4  HCT 35.9*      Assessment/Plan: 27 y.o.  PPD #1 .  normal postpartum exam Continue current postpartum care Ambulate   LOS: 1 day   JACKSON-MOORE,Izzy Doubek A 12/06/2012, 10:37 AM

## 2012-12-07 MED ORDER — NORETHIN-ETH ESTRAD-FE BIPHAS 1 MG-10 MCG / 10 MCG PO TABS: 1.0000 | ORAL_TABLET | Freq: Every day | ORAL | Status: DC

## 2012-12-07 NOTE — Discharge Summary (Signed)
Obstetric Discharge Summary Reason for Admission: onset of labor Prenatal Procedures: none Intrapartum Procedures: spontaneous vaginal delivery Postpartum Procedures: none Complications-Operative and Postpartum: none Hemoglobin  Date Value Range Status  12/05/2012 12.4  12.0 - 15.0 g/dL Final  4/54/0981 19.1   Final     HCT  Date Value Range Status  12/05/2012 35.9* 36.0 - 46.0 % Final  05/03/2012 38   Final    Physical Exam:  General: alert and cooperative Lochia: appropriate Uterine Fundus: firm Incision: healing well DVT Evaluation: No evidence of DVT seen on physical exam.  Discharge Diagnoses: Term Pregnancy-delivered  Discharge Information: Date: 12/07/2012 Activity: pelvic rest Diet: routine Medications: None Condition: stable Instructions: refer to practice specific booklet Discharge to: home  BCM: Lo Estrin FE   Newborn Data: Live born female  Birth Weight: 8 lb 2 oz (3685 g) APGAR: 9, 9  Home with mother.  [name] 12/07/2012, 9:47 AM

## 2013-01-27 ENCOUNTER — Ambulatory Visit (INDEPENDENT_AMBULATORY_CARE_PROVIDER_SITE_OTHER): Payer: 59 | Admitting: Obstetrics & Gynecology

## 2013-01-27 ENCOUNTER — Encounter: Payer: Self-pay | Admitting: Obstetrics & Gynecology

## 2013-01-27 DIAGNOSIS — G43109 Migraine with aura, not intractable, without status migrainosus: Secondary | ICD-10-CM | POA: Insufficient documentation

## 2013-01-27 NOTE — Progress Notes (Signed)
Subjective:     [name] is a 27 y.o. female who presents for a postpartum visit. She is 7 weeks postpartum following a spontaneous vaginal delivery. I have fully reviewed the prenatal and intrapartum course. The delivery was at 40 gestational weeks. Outcome: spontaneous vaginal delivery. Anesthesia: IV sedation. Postpartum course has been WNL. Baby's course has been WNL. Baby is feeding by bottle - Gerber Goodstart Gentle. Bleeding no bleeding. Bowel function is normal. Bladder function is normal. Patient is sexually active. Contraception method is condoms. Postpartum depression screening: negative.  The following portions of the patient's history were reviewed and updated as appropriate: allergies, current medications, past family history, past medical history, past social history, past surgical history and problem list.  Review of Systems Pertinent items are noted in HPI.   Objective:    BP 127/78  Pulse 89  Temp(Src) 97 F (36.1 C)  Ht 5\' 2"  (1.575 m)  Wt 170 lb (77.111 kg)  BMI 31.09 kg/m2  Breastfeeding? No        General:  alert     Abdomen: soft, non-tender; bowel sounds normal; no masses,  no organomegaly   Vulva:  normal  Vagina: normal vagina  Cervix:  no lesions  Corpus: normal size, contour, position, consistency, mobility, non-tender  Adnexa:  normal adnexa   Assessment:     Normal postpartum exam. Pap smear not done at today's visit.  Plan:    1. Contraception: condoms 2. Considering IUD or possible diaphragm 3. Follow up in: 1 yr or as needed.

## 2013-01-27 NOTE — Patient Instructions (Signed)
Diaphragm A diaphragmis a soft, latex, dome-shaped barrier that is placed in the vagina with spermicidal jelly before sexual intercourse. It covers the cervix, kills sperm, and blocks the passage of sperm into the cervix. This method does not protect against sexually transmitted diseases (STDs). A diaphragm must be fitted by a caregiver during a pelvic exam. A caregiver will measure your vagina prior to prescribing a diaphragm. You will also learn about use, care, and problems of a diaphragm during the exam. If you have significant weight changes or become pregnant, it is important to have the diaphragm rechecked and possibly refitted. The diaphragm should be replaced every 2 years, or sooner if damaged. ADVANTAGES  You can use it while breastfeeding.  It is not felt by your sex partner.  It does not interfere with your female hormones.  It works immediately and is not permanent. DISADVANTAGES  It is sometimes difficult to insert.  It may shift out of place during sexual intercourse.  You must have it fitted and refitted by your caregiver. HOW TO INSERT A DIAPHRAGM 1. Check the diaphragm for holes by holding it up to the light, stretching the latex, or by filling it with water. 2. Place the spermicide cream or jelly inside the dome and around the rim of the diaphragm. 3. Squeeze the rim of the diaphragm and insert the diaphragm into the vagina. 4. The opening of the dome should face the cervix while inserting the diaphragm. 5. The front part (or top) of the rim should be behind the pubic bone and pushed over the top of the cervix. 6. Be sure the cervix is completely covered by reaching into your vagina and feeling the cervix behind the latex dome of the diaphragm. If you are uncomfortable, it is not inserted properly. Try inserting it again. 7. Leave the diaphragm in for 6 to 8 hours after intercourse. If intercourse occurs again within these 6 hours, another application of spermicide is  needed. 8. The diaphragm should not be left in place for longer than 24 hours. HOME CARE INSTRUCTIONS   Wash the diaphragm with mild soap and warm water. Rinse thoroughly and dry completely after every use.  Only use water-based lubricants with the diaphragm. Oil-based lubricants can damage the diaphragm.  Do not use talc on the diaphragm.  Do not use the diaphragm if:  You had a baby in the last 2 months.  You have a vaginal infection.  You are having a menstrual period.  You had recent surgery on your cervix or vagina.  You have vaginal bleeding of unknown cause.  Your sex partner is allergic to latex or spermicides. SEEK MEDICAL CARE IF:   You have pain during sexual intercourse when using the diaphragm.  The diaphragm slips out of place during sexual intercourse.  You have a fever.  You have blood in your urine.  You have burning or pain when you urinate.  You find a hole in the diaphragm.  You develop abnormal vaginal discharge.  You have vaginal itching or irritation.  You cannot remove the diaphragm.  You think you may be pregnant.  You need to be refitted for a diaphragm. Document Released: 06/14/2002 Document Revised: 06/16/2011 Document Reviewed: 08/07/2010 [facility] Patient Information 2014 [place], [place]. Intrauterine Device Information An intrauterine device (IUD) is inserted into your uterus and prevents pregnancy. There are 2 types of IUDs available:  Copper IUD. This type of IUD is wrapped in copper wire and is placed inside the uterus. Copper makes  the uterus and fallopian tubes produce a fluid that kills sperm. The copper IUD can stay in place for 10 years.  Hormone IUD. This type of IUD contains the hormone progestin (synthetic progesterone). The hormone thickens the cervical mucus and prevents sperm from entering the uterus, and it also thins the uterine lining to prevent implantation of a fertilized egg. The hormone can weaken or kill the  sperm that get into the uterus. The hormone IUD can stay in place for 5 years. Your caregiver will make sure you are a good candidate for a contraceptive IUD. Discuss with your caregiver the possible side effects. ADVANTAGES  It is highly effective, reversible, long-acting, and low maintenance.  There are no estrogen-related side effects.  An IUD can be used when breastfeeding.  It is not associated with weight gain.  It works immediately after insertion.  The copper IUD does not interfere with your female hormones.  The progesterone IUD can make heavy menstrual periods lighter.  The progesterone IUD can be used for 5 years.  The copper IUD can be used for 10 years. DISADVANTAGES  The progesterone IUD can be associated with irregular bleeding patterns.  The copper IUD can make your menstrual flow heavier and more painful.  You may experience cramping and vaginal bleeding after insertion. Document Released: 02/26/2004 Document Revised: 06/16/2011 Document Reviewed: 07/27/2010 [facility] Patient Information 2014 [place], [place].

## 2013-02-10 ENCOUNTER — Other Ambulatory Visit: Payer: Self-pay

## 2014-02-06 ENCOUNTER — Encounter: Payer: Self-pay | Admitting: Obstetrics & Gynecology

## 2014-04-03 ENCOUNTER — Encounter: Payer: Self-pay | Admitting: *Deleted

## 2014-04-04 ENCOUNTER — Encounter: Payer: Self-pay | Admitting: Obstetrics & Gynecology

## 2014-04-10 ENCOUNTER — Emergency Department (HOSPITAL_COMMUNITY): Payer: 59

## 2014-04-10 ENCOUNTER — Emergency Department (INDEPENDENT_AMBULATORY_CARE_PROVIDER_SITE_OTHER): Payer: 59

## 2014-04-10 ENCOUNTER — Encounter (HOSPITAL_COMMUNITY): Payer: Self-pay | Admitting: *Deleted

## 2014-04-10 ENCOUNTER — Emergency Department (INDEPENDENT_AMBULATORY_CARE_PROVIDER_SITE_OTHER)
Admission: EM | Admit: 2014-04-10 | Discharge: 2014-04-10 | Disposition: A | Discharge status: Home / Self Care | Payer: 59 | Source: Home / Self Care | Attending: Emergency Medicine | Admitting: Emergency Medicine

## 2014-04-10 DIAGNOSIS — S4992XA Unspecified injury of left shoulder and upper arm, initial encounter: Secondary | ICD-10-CM

## 2014-04-10 MED ORDER — HYDROCODONE-ACETAMINOPHEN 5-325 MG PO TABS: 1.0000 | ORAL_TABLET | Freq: Four times a day (QID) | ORAL | Status: DC | PRN
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
## 2014-04-10 NOTE — Discharge Instructions (Signed)
The bone is not broken. You may develop significant bruising. Apply ice 2-3 times a day for the next 2-3 days to help minimize the bruising. Take ibuprofen  every 6-8 hours as needed for pain. Use Norco every 4-6 hours as needed for severe pain.  Follow up as needed.
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
## 2014-04-10 NOTE — ED Notes (Signed)
Physicist, medical  In    [employer]  And  [employer]

## 2015-09-05 ENCOUNTER — Emergency Department (HOSPITAL_COMMUNITY)
Admission: EM | Admit: 2015-09-05 | Discharge: 2015-09-06 | Disposition: A | Discharge status: Home / Self Care | Payer: 59 | Attending: Emergency Medicine | Admitting: Emergency Medicine

## 2015-09-05 ENCOUNTER — Encounter (HOSPITAL_COMMUNITY): Payer: Self-pay | Admitting: *Deleted

## 2015-09-05 DIAGNOSIS — Z87891 Personal history of nicotine dependence: Secondary | ICD-10-CM | POA: Insufficient documentation

## 2015-09-05 DIAGNOSIS — Z8719 Personal history of other diseases of the digestive system: Secondary | ICD-10-CM | POA: Insufficient documentation

## 2015-09-05 DIAGNOSIS — F419 Anxiety disorder, unspecified: Secondary | ICD-10-CM

## 2015-09-05 DIAGNOSIS — Z9889 Other specified postprocedural states: Secondary | ICD-10-CM | POA: Insufficient documentation

## 2015-09-05 DIAGNOSIS — Z8679 Personal history of other diseases of the circulatory system: Secondary | ICD-10-CM | POA: Insufficient documentation

## 2015-09-05 HISTORY — DX: Panic disorder (episodic paroxysmal anxiety): F41.0

## 2015-09-05 HISTORY — DX: Major depressive disorder, single episode, unspecified: F32.9

## 2015-09-05 HISTORY — DX: Depression, unspecified: F32.A

## 2015-09-05 LAB — URINALYSIS, ROUTINE W REFLEX MICROSCOPIC
BILIRUBIN URINE: NEGATIVE
Glucose, UA: NEGATIVE mg/dL
Hgb urine dipstick: NEGATIVE
KETONES UR: NEGATIVE mg/dL
LEUKOCYTES UA: NEGATIVE
NITRITE: NEGATIVE
PH: 7 (ref 5.0–8.0)
Protein, ur: NEGATIVE mg/dL
Specific Gravity, Urine: 1.025 (ref 1.005–1.030)
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
## 2015-09-06 MED ORDER — LORAZEPAM 1 MG PO TABS
1.0000 mg | ORAL_TABLET | Freq: Once | ORAL | Status: AC
  Administered 2015-09-06: 1 mg via ORAL
  Filled 2015-09-06: qty 1

## 2015-09-06 NOTE — ED Notes (Signed)
Pt calm in no apparent distress, states she just feels anxious
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
## 2015-09-06 NOTE — Discharge Instructions (Signed)
Generalized Anxiety Disorder °Generalized anxiety disorder (GAD) is a mental disorder. It interferes with life functions, including relationships, work, and school. °GAD is different from normal anxiety, which everyone experiences at some point in their lives in response to specific life events and activities. Normal anxiety actually helps us prepare for and get through these life events and activities. Normal anxiety goes away after the event or activity is over.  °GAD causes anxiety that is not necessarily related to specific events or activities. It also causes excess anxiety in proportion to specific events or activities. The anxiety associated with GAD is also difficult to control. GAD can vary from mild to severe. People with severe GAD can have intense waves of anxiety with physical symptoms (panic attacks).  °SYMPTOMS °The anxiety and worry associated with GAD are difficult to control. This anxiety and worry are related to many life events and activities and also occur more days than not for 6 months or longer. People with GAD also have three or more of the following symptoms (one or more in children): °· Restlessness.   °· Fatigue. °· Difficulty concentrating.   °· Irritability. °· Muscle tension. °· Difficulty sleeping or unsatisfying sleep. °DIAGNOSIS °GAD is diagnosed through an assessment by your health care provider. Your health care provider will ask you questions about your mood, physical symptoms, and events in your life. Your health care provider may ask you about your medical history and use of alcohol or drugs, including prescription medicines. Your health care provider may also do a physical exam and blood tests. Certain medical conditions and the use of certain substances can cause symptoms similar to those associated with GAD. Your health care provider may refer you to a mental health specialist for further evaluation. °TREATMENT °The following therapies are usually used to treat GAD:   °· Medication. Antidepressant medication usually is prescribed for long-term daily control. Antianxiety medicines may be added in severe cases, especially when panic attacks occur.   °· Talk therapy (psychotherapy). Certain types of talk therapy can be helpful in treating GAD by providing support, education, and guidance. A form of talk therapy called cognitive behavioral therapy can teach you healthy ways to think about and react to daily life events and activities. °· Stress management techniques. These include yoga, meditation, and exercise and can be very helpful when they are practiced regularly. °A mental health specialist can help determine which treatment is best for you. Some people see improvement with one therapy. However, other people require a combination of therapies. °  °This information is not intended to replace advice given to you by your health care provider. Make sure you discuss any questions you have with your health care provider. °  °Document Released: 07/19/2012 Document Revised: 04/14/2014 Document Reviewed: 07/19/2012 °Elsevier Interactive Patient Education ©2016 Elsevier Inc. ° °Panic Attacks °Panic attacks are sudden, short-lived surges of severe anxiety, fear, or discomfort. They may occur for no reason when you are relaxed, when you are anxious, or when you are sleeping. Panic attacks may occur for a number of reasons:  °· Healthy people occasionally have panic attacks in extreme, life-threatening situations, such as war or natural disasters. Normal anxiety is a protective mechanism of the body that helps us react to danger (fight or flight response). °· Panic attacks are often seen with anxiety disorders, such as panic disorder, social anxiety disorder, generalized anxiety disorder, and phobias. Anxiety disorders cause excessive or uncontrollable anxiety. They may interfere with your relationships or other life activities. °· Panic attacks are sometimes seen with other   mental  illnesses, such as depression and posttraumatic stress disorder. °· Certain medical conditions, prescription medicines, and drugs of abuse can cause panic attacks. °SYMPTOMS  °Panic attacks start suddenly, peak within 20 minutes, and are accompanied by four or more of the following symptoms: °· Pounding heart or fast heart rate (palpitations). °· Sweating. °· Trembling or shaking. °· Shortness of breath or feeling smothered. °· Feeling choked. °· Chest pain or discomfort. °· Nausea or strange feeling in your stomach. °· Dizziness, light-headedness, or feeling like you will faint. °· Chills or hot flushes. °· Numbness or tingling in your lips or hands and feet. °· Feeling that things are not real or feeling that you are not yourself. °· Fear of losing control or going crazy. °· Fear of dying. °Some of these symptoms can mimic serious medical conditions. For example, you may think you are having a heart attack. Although panic attacks can be very scary, they are not life threatening. °DIAGNOSIS  °Panic attacks are diagnosed through an assessment by your health care provider. Your health care provider will ask questions about your symptoms, such as where and when they occurred. Your health care provider will also ask about your medical history and use of alcohol and drugs, including prescription medicines. Your health care provider may order blood tests or other studies to rule out a serious medical condition. Your health care provider may refer you to a mental health professional for further evaluation. °TREATMENT  °· Most healthy people who have one or two panic attacks in an extreme, life-threatening situation will not require treatment. °· The treatment for panic attacks associated with anxiety disorders or other mental illness typically involves counseling with a mental health professional, medicine, or a combination of both. Your health care provider will help determine what treatment is best for you. °· Panic  attacks due to physical illness usually go away with treatment of the illness. If prescription medicine is causing panic attacks, talk with your health care provider about stopping the medicine, decreasing the dose, or substituting another medicine. °· Panic attacks due to alcohol or drug abuse go away with abstinence. Some adults need professional help in order to stop drinking or using drugs. °HOME CARE INSTRUCTIONS  °· Take all medicines as directed by your health care provider.   °· Schedule and attend follow-up visits as directed by your health care provider. It is important to keep all your appointments. °SEEK MEDICAL CARE IF: °· You are not able to take your medicines as prescribed. °· Your symptoms do not improve or get worse. °SEEK IMMEDIATE MEDICAL CARE IF:  °· You experience panic attack symptoms that are different than your usual symptoms. °· You have serious thoughts about hurting yourself or others. °· You are taking medicine for panic attacks and have a serious side effect. °MAKE SURE YOU: °· Understand these instructions. °· Will watch your condition. °· Will get help right away if you are not doing well or get worse. °  °This information is not intended to replace advice given to you by your health care provider. Make sure you discuss any questions you have with your health care provider. °  °Document Released: 03/24/2005 Document Revised: 03/29/2013 Document Reviewed: 11/05/2012 °Elsevier Interactive Patient Education ©2016 Elsevier Inc. ° °

## 2015-12-04 ENCOUNTER — Ambulatory Visit: Payer: Self-pay | Admitting: Certified Nurse Midwife

## 2015-12-08 ENCOUNTER — Encounter (HOSPITAL_COMMUNITY): Payer: Self-pay | Admitting: Emergency Medicine

## 2015-12-08 ENCOUNTER — Ambulatory Visit (HOSPITAL_COMMUNITY)
Admission: EM | Admit: 2015-12-08 | Discharge: 2015-12-08 | Disposition: A | Discharge status: Home / Self Care | Payer: Medicaid Other | Attending: Family Medicine | Admitting: Family Medicine

## 2015-12-08 DIAGNOSIS — R109 Unspecified abdominal pain: Secondary | ICD-10-CM

## 2015-12-08 DIAGNOSIS — Z87891 Personal history of nicotine dependence: Secondary | ICD-10-CM | POA: Diagnosis not present

## 2015-12-08 DIAGNOSIS — Z79899 Other long term (current) drug therapy: Secondary | ICD-10-CM | POA: Insufficient documentation

## 2015-12-08 DIAGNOSIS — F329 Major depressive disorder, single episode, unspecified: Secondary | ICD-10-CM | POA: Insufficient documentation

## 2015-12-08 DIAGNOSIS — M549 Dorsalgia, unspecified: Secondary | ICD-10-CM | POA: Diagnosis present

## 2015-12-08 DIAGNOSIS — K219 Gastro-esophageal reflux disease without esophagitis: Secondary | ICD-10-CM | POA: Diagnosis not present

## 2015-12-08 LAB — POCT URINALYSIS DIP (DEVICE)
BILIRUBIN URINE: NEGATIVE
Glucose, UA: NEGATIVE mg/dL
Ketones, ur: NEGATIVE mg/dL
NITRITE: NEGATIVE
PH: 7 (ref 5.0–8.0)
Protein, ur: 30 mg/dL — AB
Specific Gravity, Urine: 1.025 (ref 1.005–1.030)
UROBILINOGEN UA: 1 mg/dL (ref 0.0–1.0)

## 2015-12-08 LAB — POCT PREGNANCY, URINE: PREG TEST UR: NEGATIVE

## 2015-12-08 MED ORDER — KETOROLAC TROMETHAMINE 30 MG/ML IJ SOLN
30.0000 mg | Freq: Once | INTRAMUSCULAR | Status: AC
  Administered 2015-12-08: 30 mg via INTRAMUSCULAR

## 2015-12-08 MED ORDER — CIPROFLOXACIN HCL 500 MG PO TABS: 500.0000 mg | ORAL_TABLET | Freq: Two times a day (BID) | ORAL | 0 refills | Status: AC

## 2015-12-08 NOTE — ED Triage Notes (Signed)
Pt c/o intermittent back pain onset x4 months ... Pain increases w/activity  Denies inj/trauma, urinary sx  Reports she began taking Zoloft about 4 months ago thinks it has something to do w/her medication.   A&O x4... NAD

## 2015-12-08 NOTE — ED Provider Notes (Signed)
CSN: [id number]     Arrival date & time 12/08/15  1254 History   First MD Initiated Contact with Patient 12/08/15 1407     Chief Complaint  Patient presents with  . Back Pain   (Consider location/radiation/quality/duration/timing/severity/associated sxs/prior Treatment) 30 year old female present with right sided lower back and flank pain that has occurred almost constantly for the past 4 months. Originally was lifting and moving objects at work at [employer] when she had a right lower back muscle strain. Took medication and rested and had slight improvement but pain never fully went away. Now at a different job that does not require lifting and still having the same pain. She also works out at [employer] and experiences minimal pain or no change in pain during her exercises. She also switched from Lexapro to Zoloft 4 months ago and is concerned her medication is causing the pain. She denies any fever, urinary symptoms, vaginal discharge or change in bowel habits. No previous history of kidney stones but strong family history (father).       Past Medical History:  Diagnosis Date  . Depression   . GERD (gastroesophageal reflux disease)   . Hx of percutaneous transcatheter closure of congenital VSD   . Migraine headache with aura   . Panic attack    Past Surgical History:  Procedure Laterality Date  . CARDIAC SURGERY     as a baby  . TONSILLECTOMY     Family History  Problem Relation Age of Onset  . Congestive Heart Failure Other   . Asthma Paternal Aunt   . Congestive Heart Failure Paternal Uncle   . Cancer Paternal Grandmother     breast  . Stroke Paternal Grandfather   . Asthma Cousin    Social History  Substance Use Topics  . Smoking status: Former Smoker    Quit date: 08/16/2010  . Smokeless tobacco: Never Used  . Alcohol use No   OB History    Gravida Para Term Preterm AB Living   2 2 2     2    SAB TAB Ectopic Multiple Live Births           2     Review  of Systems  Constitutional: Negative for chills, fatigue, fever and unexpected weight change.  Respiratory: Negative for cough.   Cardiovascular: Negative for chest pain.  Gastrointestinal: Negative for abdominal pain, blood in stool, constipation, diarrhea, nausea and vomiting.  Genitourinary: Positive for flank pain. Negative for dysuria, hematuria, pelvic pain, vaginal bleeding and vaginal discharge.  Musculoskeletal: Positive for back pain. Negative for neck pain.  Skin: Negative.   Allergic/Immunologic: Negative for immunocompromised state.  Neurological: Negative for weakness and numbness.    Allergies  Review of patient's allergies indicates no known allergies.  Home Medications   Prior to Admission medications   Medication Sig Start Date End Date Taking? Authorizing Provider  ALPRAZolam (XANAX PO) Take by mouth.   Yes Historical Provider, MD  Sertraline HCl (ZOLOFT PO) Take by mouth.   Yes Historical Provider, MD  ciprofloxacin (CIPRO) 500 MG tablet Take 1 tablet (500 mg total) by mouth 2 (two) times daily. 12/08/15 12/15/15  Sudie GrumblingAnn Berry Kimbella Heisler, NP   Meds Ordered and Administered this Visit   Medications  ketorolac (TORADOL) 30 MG/ML injection 30 mg (30 mg Intramuscular Given 12/08/15 1430)    BP 120/66 (BP Location: Left Arm)   Pulse 87   Temp 98.1 F (36.7 C) (Oral)   Resp 16  SpO2 99%  No data found.   Physical Exam  Constitutional: She appears well-developed and well-nourished. No distress.  Neck: Normal range of motion. Neck supple.  Cardiovascular: Normal rate, regular rhythm and normal heart sounds.   Pulmonary/Chest: Effort normal and breath sounds normal.  Abdominal: Soft. Bowel sounds are normal. She exhibits no mass. There is no hepatosplenomegaly. There is no tenderness. There is CVA tenderness (right side only). There is no rigidity, no rebound and no guarding.  Musculoskeletal: Normal range of motion.       Lumbar back: She exhibits tenderness (on right side  only) and pain. She exhibits normal range of motion, no swelling, no edema, no deformity and no spasm.       Back:    Urgent Care Course   Clinical Course    Procedures (including critical care time)  Labs Review Labs Reviewed  POCT URINALYSIS DIP (DEVICE) - Abnormal; Notable for the following:       Result Value   Hgb urine dipstick TRACE (*)    Protein, ur 30 (*)    Leukocytes, UA LARGE (*)    All other components within normal limits  URINE CULTURE  POCT PREGNANCY, URINE    Imaging Review No results found.   Visual Acuity Review  Right Eye Distance:   Left Eye Distance:   Bilateral Distance:    Right Eye Near:   Left Eye Near:    Bilateral Near:         MDM   1. Right flank pain    Gave Toradol 30mg  IM today for pain. Reviewed urinalysis results with patient that showed possible UTI/pyelonephritis supported by clinical exam. Start Cipro 500mg  twice a day for 7 days. Urine sent for culture. Discussed that UTI probably not entire cause of symptoms. May be kidney stone or muscular-skeletal. May also need to switch anti-depressant to determine if contributing to symptoms. Encouraged to follow-up with her primary care provider for additional testing and if symptoms do not improve within 3 to 4 days.     Sudie Grumbling, NP 12/09/15 [phone]

## 2015-12-08 NOTE — Discharge Instructions (Signed)
You were given a shot of Toradol today to help with pain. Start antibiotic Cipro twice a day for 7 days. Increase fluid intake. May take Ibuprofen at home as needed for pain. Follow-up with your primary care provider if not improving within 3 to 4 days.

## 2015-12-08 NOTE — ED Notes (Signed)
Call back number verified and updated in EPIC... Adv pt to not have SI until lab results comeback neg.... Also adv pt lab results will be on MyChart; instructions given .... Pt verb understanding.   

## 2015-12-09 LAB — URINE CULTURE: Special Requests: NORMAL

## 2016-01-27 ENCOUNTER — Encounter (HOSPITAL_COMMUNITY): Payer: Self-pay | Admitting: *Deleted

## 2016-01-27 ENCOUNTER — Ambulatory Visit (INDEPENDENT_AMBULATORY_CARE_PROVIDER_SITE_OTHER): Payer: 59

## 2016-01-27 ENCOUNTER — Ambulatory Visit (HOSPITAL_COMMUNITY)
Admission: EM | Admit: 2016-01-27 | Discharge: 2016-01-27 | Disposition: A | Discharge status: Home / Self Care | Payer: 59 | Attending: Family Medicine | Admitting: Family Medicine

## 2016-01-27 DIAGNOSIS — S93401A Sprain of unspecified ligament of right ankle, initial encounter: Secondary | ICD-10-CM

## 2016-01-27 MED ORDER — IBUPROFEN 800 MG PO TABS: 800.0000 mg | ORAL_TABLET | Freq: Three times a day (TID) | ORAL | 0 refills | Status: DC

## 2016-01-27 NOTE — ED Triage Notes (Signed)
Pt  Reports   She  Was  Hiking  Yesterday       And  She  Felled  Injuring  Her    r  Ankle  And  l   Wrist    She  Has  A  Mild  Abrasion to  The  Affected  Area        She  Has  Been  Applying tylenol    And   Elevation  To  The  Affected  Area

## 2016-01-27 NOTE — Discharge Instructions (Signed)
Wear ankle support as needed for comfort,ice and  activity as tolerated. advil or pain medicine as needed, return or see orthopedist if further problems.

## 2016-01-27 NOTE — ED Provider Notes (Signed)
[facility]    CSN: [id number] Arrival date & time: 01/27/16  1449     History   Chief Complaint Chief Complaint  Patient presents with  . Ankle Pain    HPI [name] is a 30 y.o. female.   The history is provided by the patient.  Ankle Pain  Location:  Ankle Injury: yes   Mechanism of injury: fall   Fall:    Fall occurred: hiking and fell twisting right ankle.   Impact surface:  Dirt   Entrapped after fall: no   Ankle location:  R ankle Pain details:    Severity:  Mild   Onset quality:  Sudden Chronicity:  New Dislocation: no   Prior injury to area:  Yes Relieved by:  None tried Worsened by:  Nothing Ineffective treatments:  None tried Associated symptoms: no decreased ROM     Past Medical History:  Diagnosis Date  . Depression   . GERD (gastroesophageal reflux disease)   . Hx of percutaneous transcatheter closure of congenital VSD   . Migraine headache with aura   . Panic attack     Patient Active Problem List   Diagnosis Date Noted  . Migraine headache with aura   . GERD 12/01/2006    Past Surgical History:  Procedure Laterality Date  . CARDIAC SURGERY     as a baby  . TONSILLECTOMY      OB History    Gravida Para Term Preterm AB Living   2 2 2     2    SAB TAB Ectopic Multiple Live Births           2       Home Medications    Prior to Admission medications   Medication Sig Start Date End Date Taking? Authorizing Provider  ALPRAZolam (XANAX PO) Take by mouth.    Historical Provider, MD  Sertraline HCl (ZOLOFT PO) Take by mouth.    Historical Provider, MD    Family History Family History  Problem Relation Age of Onset  . Congestive Heart Failure Other   . Asthma Paternal Aunt   . Congestive Heart Failure Paternal Uncle   . Cancer Paternal Grandmother     breast  . Stroke Paternal Grandfather   . Asthma Cousin     Social History Social History  Substance Use Topics  . Smoking status: Former Smoker   Quit date: 08/16/2010  . Smokeless tobacco: Never Used  . Alcohol use No     Allergies   Review of patient's allergies indicates no known allergies.   Review of Systems Review of Systems  Constitutional: Negative.   Musculoskeletal: Positive for gait problem and joint swelling.  Skin: Negative.   All other systems reviewed and are negative.    Physical Exam Triage Vital Signs ED Triage Vitals  Enc Vitals Group     BP 01/27/16 1646 117/73     Pulse Rate 01/27/16 1646 100     Resp 01/27/16 1646 16     Temp 01/27/16 1646 98.1 F (36.7 C)     Temp Source 01/27/16 1646 Oral     SpO2 01/27/16 1646 96 %     Weight --      Height --      Head Circumference --      Peak Flow --      Pain Score 01/27/16 1658 10     Pain Loc --      Pain Edu? --  Excl. in GC? --    No data found.   Updated Vital Signs BP 117/73 (BP Location: Right Arm)   Pulse 100   Temp 98.1 F (36.7 C) (Oral)   Resp 16   LMP 12/31/2015   SpO2 96%   Visual Acuity Right Eye Distance:   Left Eye Distance:   Bilateral Distance:    Right Eye Near:   Left Eye Near:    Bilateral Near:     Physical Exam  Constitutional: She is oriented to person, place, and time. She appears well-developed and well-nourished. No distress.  Musculoskeletal: She exhibits tenderness. She exhibits no deformity.       Right ankle: She exhibits swelling. She exhibits normal range of motion, no ecchymosis, no deformity and normal pulse. Tenderness. Lateral malleolus tenderness found. No head of 5th metatarsal and no proximal fibula tenderness found. Achilles tendon normal. Achilles tendon exhibits no pain, no defect and normal Thompson's test results.  Neurological: She is alert and oriented to person, place, and time.  Skin: Skin is warm and dry.  Nursing note and vitals reviewed.    UC Treatments / Results  Labs (all labs ordered are listed, but only abnormal results are displayed) Labs Reviewed - No data to  display  EKG  EKG Interpretation None       Radiology No results found.  Procedures Procedures (including critical care time)  Medications Ordered in UC Medications - No data to display   Initial Impression / Assessment and Plan / UC Course  I have reviewed the triage vital signs and the nursing notes.  Pertinent labs & imaging results that were available during my care of the patient were reviewed by me and considered in my medical decision making (see chart for details).  Clinical Course      Final Clinical Impressions(s) / UC Diagnoses   Final diagnoses:  None    New Prescriptions New Prescriptions   No medications on file     Linna Hoff, MD 01/28/16 1500

## 2016-03-10 ENCOUNTER — Encounter (HOSPITAL_COMMUNITY): Payer: Self-pay | Admitting: Family Medicine

## 2016-03-10 ENCOUNTER — Ambulatory Visit (HOSPITAL_COMMUNITY)
Admission: EM | Admit: 2016-03-10 | Discharge: 2016-03-10 | Disposition: A | Discharge status: Home / Self Care | Payer: 59 | Attending: Family Medicine | Admitting: Family Medicine

## 2016-03-10 DIAGNOSIS — J028 Acute pharyngitis due to other specified organisms: Secondary | ICD-10-CM

## 2016-03-10 MED ORDER — AMOXICILLIN 875 MG PO TABS: 875.0000 mg | ORAL_TABLET | Freq: Two times a day (BID) | ORAL | 0 refills | Status: DC

## 2016-03-10 MED ORDER — MAGIC MOUTHWASH W/LIDOCAINE: 10.0000 mL | ORAL | 0 refills | Status: DC | PRN

## 2016-03-10 NOTE — ED Triage Notes (Signed)
Pt  Reports    Symptoms  Of  sorethroat  Pain in  Both  Ears  When  She  Swallows       Symptoms  X  1  Week       Pt is  A  Smoker    She  Is  Sitting  Upright on   The  Exam table   Speaking in  Complete  sentances  And is  In no   Acute  Distress

## 2016-03-10 NOTE — ED Provider Notes (Signed)
[facility]    CSN: [id number] Arrival date & time: 03/10/16  1300     History   Chief Complaint Chief Complaint  Patient presents with  . Otalgia    HPI [name] is a 30 y.o. female.   This 30 year old woman presents with sore throat. It began yesterday on the right side and now involves the entire throat. It's painful to talk. She has pain radiating to her right ear.  She works at a [employer] and she goes to school for cosmetology.      Past Medical History:  Diagnosis Date  . Depression   . GERD (gastroesophageal reflux disease)   . Hx of percutaneous transcatheter closure of congenital VSD   . Migraine headache with aura   . Panic attack     Patient Active Problem List   Diagnosis Date Noted  . Migraine headache with aura   . GERD 12/01/2006    Past Surgical History:  Procedure Laterality Date  . CARDIAC SURGERY     as a baby  . TONSILLECTOMY      OB History    Gravida Para Term Preterm AB Living   2 2 2     2    SAB TAB Ectopic Multiple Live Births           2       Home Medications    Prior to Admission medications   Medication Sig Start Date End Date Taking? Authorizing Provider  ALPRAZolam (XANAX PO) Take by mouth.    Historical Provider, MD  amoxicillin (AMOXIL) 875 MG tablet Take 1 tablet (875 mg total) by mouth 2 (two) times daily. 03/10/16   Elvina SidleKurt Arvie Villarruel, MD  ibuprofen (ADVIL,MOTRIN) 800 MG tablet Take 1 tablet (800 mg total) by mouth 3 (three) times daily. 01/27/16   Linna HoffJames D Kindl, MD  magic mouthwash w/lidocaine SOLN Take 10 mLs by mouth every 2 (two) hours as needed for mouth pain. 03/10/16   Elvina SidleKurt Samaa Ueda, MD  Sertraline HCl (ZOLOFT PO) Take by mouth.    Historical Provider, MD    Family History Family History  Problem Relation Age of Onset  . Congestive Heart Failure Other   . Asthma Paternal Aunt   . Congestive Heart Failure Paternal Uncle   . Cancer Paternal Grandmother     breast  . Stroke Paternal  Grandfather   . Asthma Cousin     Social History Social History  Substance Use Topics  . Smoking status: Former Smoker    Quit date: 08/16/2010  . Smokeless tobacco: Never Used  . Alcohol use No     Allergies   Patient has no known allergies.   Review of Systems Review of Systems  Constitutional: Positive for diaphoresis.  HENT: Positive for ear pain and sore throat.   Eyes: Negative.   Cardiovascular: Negative.   Gastrointestinal: Negative.   Neurological: Negative.      Physical Exam Triage Vital Signs ED Triage Vitals  Enc Vitals Group     BP      Pulse      Resp      Temp      Temp src      SpO2      Weight      Height      Head Circumference      Peak Flow      Pain Score      Pain Loc      Pain Edu?  Excl. in GC?    No data found.   Updated Vital Signs There were no vitals taken for this visit.      Physical Exam  Constitutional: She is oriented to person, place, and time. She appears well-developed and well-nourished.  HENT:  Right Ear: External ear normal.  Left Ear: External ear normal.  Mouth/Throat: Oropharyngeal exudate present.  Eyes: Conjunctivae and EOM are normal. Pupils are equal, round, and reactive to light.  Neck: Normal range of motion. Neck supple.  Musculoskeletal: Normal range of motion.  Lymphadenopathy:    She has cervical adenopathy.  Neurological: She is alert and oriented to person, place, and time.  Skin: Skin is warm and dry.  Nursing note and vitals reviewed.    UC Treatments / Results  Labs (all labs ordered are listed, but only abnormal results are displayed) Labs Reviewed - No data to display  EKG  EKG Interpretation None       Radiology No results found.  Procedures Procedures (including critical care time)  Medications Ordered in UC Medications - No data to display   Initial Impression / Assessment and Plan / UC Course  I have reviewed the triage vital signs and the nursing  notes.  Pertinent labs & imaging results that were available during my care of the patient were reviewed by me and considered in my medical decision making (see chart for details).  Clinical Course     Final Clinical Impressions(s) / UC Diagnoses   Final diagnoses:  Acute pharyngitis due to other specified organisms    New Prescriptions New Prescriptions   AMOXICILLIN (AMOXIL) 875 MG TABLET    Take 1 tablet (875 mg total) by mouth 2 (two) times daily.   MAGIC MOUTHWASH W/LIDOCAINE SOLN    Take 10 mLs by mouth every 2 (two) hours as needed for mouth pain.     Elvina SidleKurt Mehek Grega, MD 03/10/16 1331

## 2016-03-12 ENCOUNTER — Emergency Department (HOSPITAL_COMMUNITY)
Admission: EM | Admit: 2016-03-12 | Discharge: 2016-03-12 | Disposition: A | Discharge status: Home / Self Care | Payer: Medicaid Other | Attending: Emergency Medicine | Admitting: Emergency Medicine

## 2016-03-12 ENCOUNTER — Encounter (HOSPITAL_COMMUNITY): Payer: Self-pay | Admitting: Emergency Medicine

## 2016-03-12 DIAGNOSIS — F17200 Nicotine dependence, unspecified, uncomplicated: Secondary | ICD-10-CM | POA: Insufficient documentation

## 2016-03-12 DIAGNOSIS — J029 Acute pharyngitis, unspecified: Secondary | ICD-10-CM | POA: Diagnosis not present

## 2016-03-12 LAB — RAPID STREP SCREEN (MED CTR MEBANE ONLY): STREPTOCOCCUS, GROUP A SCREEN (DIRECT): NEGATIVE

## 2016-03-12 MED ORDER — DEXAMETHASONE SODIUM PHOSPHATE 10 MG/ML IJ SOLN
10.0000 mg | Freq: Once | INTRAMUSCULAR | Status: AC
  Administered 2016-03-12: 10 mg via INTRAMUSCULAR
  Filled 2016-03-12: qty 1

## 2016-03-12 MED ORDER — KETOROLAC TROMETHAMINE 15 MG/ML IJ SOLN
15.0000 mg | Freq: Once | INTRAMUSCULAR | Status: AC
Start: 2016-03-12 — End: 2016-03-12
  Administered 2016-03-12: 15 mg via INTRAMUSCULAR
  Filled 2016-03-12: qty 1

## 2016-03-12 MED ORDER — ACETAMINOPHEN 325 MG PO TABS
ORAL_TABLET | ORAL | Status: AC
  Filled 2016-03-12: qty 2

## 2016-03-12 MED ORDER — ACETAMINOPHEN 325 MG PO TABS
650.0000 mg | ORAL_TABLET | Freq: Once | ORAL | Status: AC | PRN
  Administered 2016-03-12: 650 mg via ORAL

## 2016-03-12 MED ORDER — NAPROXEN 500 MG PO TABS: 500.0000 mg | ORAL_TABLET | Freq: Two times a day (BID) | ORAL | 0 refills | Status: DC

## 2016-03-12 NOTE — ED Triage Notes (Signed)
Pt presents to ED for assessment of sore throat since Sunday.  [facility].  Pt denies any relief and continued swelling in throat.  Pt sts small chunks of tomato soup are getting caught.  Pt able to talk, voice is garbled.  Pt c/o increasing pain as well.

## 2016-03-12 NOTE — ED Provider Notes (Signed)
[facility]   CSN: [id number] Arrival date & time: 03/12/16  0011  By signing my name below, I, Arianna Nassar, attest that this documentation has been prepared under the direction and in the presence of Shon Batonourtney F Ambry Dix, MD.  Electronically Signed: Octavia HeirArianna Nassar, ED Scribe. 03/12/16. 3:45 AM.    History   Chief Complaint Chief Complaint  Patient presents with  . Sore Throat    The history is provided by the patient. No language interpreter was used.   HPI Comments: [name] is a 30 y.o. female who presents to the Emergency Department complaining of gradual worsening, moderate, 7/10 sore throat x 3 days. She reports associate fever (tmax 100.8), dizziness, rhinorrhea, difficulty swallowing, and mild cough. Pt has a surgical hx of tonsillectomy. [facility] same symptoms and received Amoxicillin. She has been taking naproxen without relief. Pt says she is able to tolerate fluids appropriately but it is very painful. Denies neck pain. She denies nausea or vomiting.  Past Medical History:  Diagnosis Date  . Depression   . GERD (gastroesophageal reflux disease)   . Hx of percutaneous transcatheter closure of congenital VSD   . Migraine headache with aura   . Panic attack     Patient Active Problem List   Diagnosis Date Noted  . Migraine headache with aura   . GERD 12/01/2006    Past Surgical History:  Procedure Laterality Date  . CARDIAC SURGERY     as a baby  . TONSILLECTOMY      OB History    Gravida Para Term Preterm AB Living   2 2 2     2    SAB TAB Ectopic Multiple Live Births           2       Home Medications    Prior to Admission medications   Medication Sig Start Date End Date Taking? Authorizing Provider  ALPRAZolam (XANAX PO) Take by mouth.    Historical Provider, MD  amoxicillin (AMOXIL) 875 MG tablet Take 1 tablet (875 mg total) by mouth 2 (two) times daily. 03/10/16   Elvina SidleKurt Lauenstein, MD  ibuprofen  (ADVIL,MOTRIN) 800 MG tablet Take 1 tablet (800 mg total) by mouth 3 (three) times daily. 01/27/16   Linna HoffJames D Kindl, MD  magic mouthwash w/lidocaine SOLN Take 10 mLs by mouth every 2 (two) hours as needed for mouth pain. 03/10/16   Elvina SidleKurt Lauenstein, MD  naproxen (NAPROSYN) 500 MG tablet Take 1 tablet (500 mg total) by mouth 2 (two) times daily. 03/12/16   Shon Batonourtney F Winner Valeriano, MD  Sertraline HCl (ZOLOFT PO) Take by mouth.    Historical Provider, MD    Family History Family History  Problem Relation Age of Onset  . Congestive Heart Failure Other   . Asthma Paternal Aunt   . Congestive Heart Failure Paternal Uncle   . Cancer Paternal Grandmother     breast  . Stroke Paternal Grandfather   . Asthma Cousin     Social History Social History  Substance Use Topics  . Smoking status: Current Some Day Smoker    Last attempt to quit: 08/16/2010  . Smokeless tobacco: Never Used  . Alcohol use No     Allergies   Patient has no known allergies.   Review of Systems Review of Systems  Constitutional: Positive for chills and fever.  HENT: Positive for rhinorrhea and sore throat.   Respiratory: Positive for cough.   Gastrointestinal:  Negative for nausea and vomiting.     Physical Exam Updated Vital Signs BP 118/60   Pulse 98   Temp 98.2 F (36.8 C) (Oral)   Resp 20   Ht 5\' 2"  (1.575 m)   Wt 185 lb (83.9 kg)   LMP 02/12/2016   SpO2 99%   BMI 33.84 kg/m   Physical Exam  Constitutional: She is oriented to person, place, and time. She appears well-developed and well-nourished. No distress.  HENT:  Head: Normocephalic and atraumatic.  Mild posterior oropharyngeal erythema, no palatal petechiae, no exudate noted, status post tonsillectomy, uvula midline, no trismus  Eyes: Pupils are equal, round, and reactive to light.  Neck: Normal range of motion. Neck supple.  Cardiovascular: Normal rate, regular rhythm and normal heart sounds.   No murmur heard. Pulmonary/Chest: Effort normal and  breath sounds normal. No respiratory distress. She has no wheezes.  Abdominal: Soft. There is no tenderness.  Lymphadenopathy:    She has cervical adenopathy.  Neurological: She is alert and oriented to person, place, and time.  Skin: Skin is warm and dry.  Psychiatric: She has a normal mood and affect.  Nursing note and vitals reviewed.    ED Treatments / Results  DIAGNOSTIC STUDIES: Oxygen Saturation is 99% on RA, normal by my interpretation.  COORDINATION OF CARE:  3:45 AM Discussed treatment plan with pt at bedside and pt agreed to plan.  Labs (all labs ordered are listed, but only abnormal results are displayed) Labs Reviewed  RAPID STREP SCREEN (NOT AT [facility])  CULTURE, GROUP A STREP [facility])    EKG  EKG Interpretation None       Radiology No results found.  Procedures Procedures (including critical care time)  Medications Ordered in ED Medications  ketorolac (TORADOL) 15 MG/ML injection 15 mg (not administered)  dexamethasone (DECADRON) injection 10 mg (not administered)  acetaminophen (TYLENOL) tablet 650 mg (650 mg Oral Given 03/12/16 0028)     Initial Impression / Assessment and Plan / ED Course  I have reviewed the triage vital signs and the nursing notes.  Pertinent labs & imaging results that were available during my care of the patient were reviewed by me and considered in my medical decision making (see chart for details).  Clinical Course     Patient presents with sore throat. Currently on treatment for strep throat. I have reviewed chart and patient per chart review had tonsillar exudate on exam on December 4. No exudate now. She also has additional symptoms suggestive of viral etiology. She has no signs or symptoms of deep space infection. Continue supportive measures at home. At this time, continue antibiotics given clinical exam at urgent care.  After history, exam, and medical workup I feel the patient has been appropriately medically screened  and is safe for discharge home. Pertinent diagnoses were discussed with the patient. Patient was given return precautions.  I personally performed the services described in this documentation, which was scribed in my presence. The recorded information has been reviewed and is accurate.  Final Clinical Impressions(s) / ED Diagnoses   Final diagnoses:  Sore throat    New Prescriptions New Prescriptions   NAPROXEN (NAPROSYN) 500 MG TABLET    Take 1 tablet (500 mg total) by mouth 2 (two) times daily.     Shon Batonourtney F Jarika Robben, MD 03/12/16 0400

## 2016-03-12 NOTE — Discharge Instructions (Signed)
You were seen today for sore throat and fever. UR artery on treatment for strep throat. Continue amoxicillin at home. Your exam is largely reassuring. Your given medications for pain. Continue naproxen at home. Salt water gargle. If you develop worsening pain, worsening fevers, or neck stiffness you need to be reevaluated immediately.

## 2016-03-14 LAB — CULTURE, GROUP A STREP (THRC)

## 2016-05-21 ENCOUNTER — Ambulatory Visit (HOSPITAL_COMMUNITY)
Admission: EM | Admit: 2016-05-21 | Discharge: 2016-05-21 | Disposition: A | Discharge status: Home / Self Care | Payer: Medicaid Other | Attending: Family Medicine | Admitting: Family Medicine

## 2016-05-21 ENCOUNTER — Encounter (HOSPITAL_COMMUNITY): Payer: Self-pay | Admitting: Emergency Medicine

## 2016-05-21 DIAGNOSIS — J069 Acute upper respiratory infection, unspecified: Secondary | ICD-10-CM

## 2016-05-21 NOTE — ED Provider Notes (Signed)
CSN: [id number]     Arrival date & time 05/21/16  1148 History   First MD Initiated Contact with Patient 05/21/16 1307     Chief Complaint  Patient presents with  . Cough   (Consider location/radiation/quality/duration/timing/severity/associated sxs/prior Treatment) 31 year old female complaining of cough and runny nose, scratchy throat, PND, chest pain when coughing. Denies fevers or shortness of breath.      Past Medical History:  Diagnosis Date  . Depression   . GERD (gastroesophageal reflux disease)   . Hx of percutaneous transcatheter closure of congenital VSD   . Migraine headache with aura   . Panic attack    Past Surgical History:  Procedure Laterality Date  . CARDIAC SURGERY     as a baby  . TONSILLECTOMY     Family History  Problem Relation Age of Onset  . Congestive Heart Failure Other   . Asthma Paternal Aunt   . Congestive Heart Failure Paternal Uncle   . Cancer Paternal Grandmother     breast  . Stroke Paternal Grandfather   . Asthma Cousin    Social History  Substance Use Topics  . Smoking status: Current Some Day Smoker    Last attempt to quit: 08/16/2010  . Smokeless tobacco: Never Used  . Alcohol use No   OB History    Gravida Para Term Preterm AB Living   2 2 2     2    SAB TAB Ectopic Multiple Live Births           2     Review of Systems  Constitutional: Negative for activity change, appetite change, chills, fatigue and fever.  HENT: Positive for congestion, postnasal drip and rhinorrhea. Negative for facial swelling.   Eyes: Negative.   Respiratory: Positive for cough. Negative for shortness of breath.   Cardiovascular: Positive for chest pain.  Gastrointestinal: Negative.   Musculoskeletal: Negative for neck pain and neck stiffness.  Skin: Negative for pallor and rash.  Neurological: Negative.   All other systems reviewed and are negative.   Allergies  Patient has no known allergies.  Home Medications   Prior to Admission  medications   Not on File   Meds Ordered and Administered this Visit  Medications - No data to display  BP 119/73 (BP Location: Right Arm)   Pulse 90   Temp 97.9 F (36.6 C) (Oral)   Resp 16   SpO2 98%  No data found.   Physical Exam  Constitutional: She is oriented to person, place, and time. She appears well-developed and well-nourished. No distress.  HENT:  Right Ear: External ear normal.  Left Ear: External ear normal.  Mouth/Throat: No oropharyngeal exudate.  Oropharynx with cobblestoning and minor erythema. Clear PND. No exudate.  Eyes: EOM are normal. Pupils are equal, round, and reactive to light.  Neck: Normal range of motion. Neck supple.  Cardiovascular: Normal rate, regular rhythm and normal heart sounds.   Pulmonary/Chest: Effort normal and breath sounds normal. No respiratory distress. She has no wheezes. She has no rales.  Musculoskeletal: Normal range of motion. She exhibits no edema.  Lymphadenopathy:    She has no cervical adenopathy.  Neurological: She is alert and oriented to person, place, and time.  Skin: Skin is warm and dry.  Psychiatric: She has a normal mood and affect.  Nursing note and vitals reviewed.   Urgent Care Course     Procedures (including critical care time)  Labs Review Labs Reviewed - No data to display  Imaging Review No results found.   Visual Acuity Review  Right Eye Distance:   Left Eye Distance:   Bilateral Distance:    Right Eye Near:   Left Eye Near:    Bilateral Near:         MDM   1. Acute upper respiratory infection    Sudafed PE 10 mg every 4 to 6 hours as needed for congestion Allegra or Zyrtec daily as needed for drainage and runny nose. For stronger antihistamine may take Chlor-Trimeton 2 to 4 mg every 4 to 6 hours, may cause drowsiness. Saline nasal spray used frequently. Ibuprofen 600 mg every 6 hours as needed for pain, discomfort or fever. Drink plenty of fluids and stay  well-hydrated.     Hayden Rasmussenavid Curtis Cain, NP 05/21/16 [phone]

## 2016-05-21 NOTE — Discharge Instructions (Signed)
Sudafed PE 10 mg every 4 to 6 hours as needed for congestion °Allegra or Zyrtec daily as needed for drainage and runny nose. °For stronger antihistamine may take Chlor-Trimeton 2 to 4 mg every 4 to 6 hours, may cause drowsiness. °Saline nasal spray used frequently. °Ibuprofen 600 mg every 6 hours as needed for pain, discomfort or fever. °Drink plenty of fluids and stay well-hydrated. °

## 2016-05-21 NOTE — ED Triage Notes (Signed)
[facility] with a complaint of a cough, nasal drainage and chest soreness x 3 days.

## 2016-07-31 ENCOUNTER — Encounter (HOSPITAL_COMMUNITY): Payer: Self-pay | Admitting: Emergency Medicine

## 2016-07-31 ENCOUNTER — Ambulatory Visit (HOSPITAL_COMMUNITY)
Admission: EM | Admit: 2016-07-31 | Discharge: 2016-07-31 | Disposition: A | Discharge status: Home / Self Care | Payer: Medicaid Other | Attending: Family Medicine | Admitting: Family Medicine

## 2016-07-31 DIAGNOSIS — B349 Viral infection, unspecified: Secondary | ICD-10-CM | POA: Diagnosis not present

## 2016-07-31 DIAGNOSIS — F17200 Nicotine dependence, unspecified, uncomplicated: Secondary | ICD-10-CM | POA: Insufficient documentation

## 2016-07-31 DIAGNOSIS — R0789 Other chest pain: Secondary | ICD-10-CM | POA: Insufficient documentation

## 2016-07-31 DIAGNOSIS — R05 Cough: Secondary | ICD-10-CM | POA: Insufficient documentation

## 2016-07-31 LAB — POCT RAPID STREP A: STREPTOCOCCUS, GROUP A SCREEN (DIRECT): NEGATIVE

## 2016-07-31 MED ORDER — IPRATROPIUM BROMIDE 0.03 % NA SOLN: 2.0000 | Freq: Two times a day (BID) | NASAL | 0 refills | Status: DC

## 2016-07-31 NOTE — Discharge Instructions (Signed)
If you develop worsening symptoms, he will see her primary care doctor or return here.  Your strep test is negative.

## 2016-07-31 NOTE — ED Provider Notes (Signed)
[facility]    CSN: [id number] Arrival date & time: 07/31/16  1119     History   Chief Complaint Chief Complaint  Patient presents with  . URI    HPI [name] is a 31 y.o. female.   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      Past Medical History:  Diagnosis Date  . Depression   . GERD (gastroesophageal reflux disease)   . Hx of percutaneous transcatheter closure of congenital VSD   . Migraine headache with aura   . Panic attack     Patient Active Problem List   Diagnosis Date Noted  . Migraine headache with aura   . GERD 12/01/2006    Past Surgical History:  Procedure Laterality Date  . CARDIAC SURGERY     as a baby  . TONSILLECTOMY      OB History    Gravida Para Term Preterm AB Living   SAB TAB Ectopic Multiple Live Births           2       Home Medications    Prior to Admission medications   Medication Sig Start Date End Date Taking? Authorizing Provider  ipratropium (ATROVENT) 0.03 % nasal spray Place 2 sprays into both nostrils 2 (two) times daily. 07/31/16   Elvina Sidle, MD    Family History Family History  Problem Relation Age of Onset  . Congestive Heart Failure Other   . Asthma Paternal Aunt   . Congestive Heart Failure Paternal Uncle   . Cancer Paternal Grandmother     breast  . Stroke Paternal Grandfather   . Asthma Cousin     Social History Social History  Substance Use Topics  . Smoking status: Current Some Day Smoker    Last attempt to quit: 08/16/2010  . Smokeless tobacco: Never Used  . Alcohol use No     Allergies   Patient has no known allergies.   Review of Systems Review of Systems  HENT: Positive for postnasal drip.   All other systems  reviewed and are negative.    Physical Exam Triage Vital Signs ED Triage Vitals  Enc Vitals Group     BP 07/31/16 1142 117/76     Pulse Rate 07/31/16 1142 (!) 119     Resp 07/31/16 1142 20     Temp 07/31/16 1142 98.6 F (37 C)     Temp Source 07/31/16 1142 Oral     SpO2 07/31/16 1142 97 %     Weight --      Height --      Head Circumference --      Peak Flow --      Pain Score 07/31/16 1139 4     Pain Loc --      Pain Edu? --      Excl. in GC? --    No data found.   Updated Vital Signs BP 117/76 (BP Location: Left Arm)   Pulse (!) 119   Temp 98.6 F (37 C) (Oral)   Resp 20   LMP 07/21/2016   SpO2 97%    Physical Exam  Constitutional: She is oriented to  person, place, and time. She appears well-developed and well-nourished.  HENT:  Right Ear: External ear normal.  Left Ear: External ear normal.  Mouth/Throat: Oropharynx is clear and moist.  Eyes: Conjunctivae and EOM are normal. Pupils are equal, round, and reactive to light.  Neck: Normal range of motion. Neck supple.  Cardiovascular: Normal rate, regular rhythm and normal heart sounds.   Pulmonary/Chest: Effort normal and breath sounds normal.  Musculoskeletal: Normal range of motion.  Neurological: She is alert and oriented to person, place, and time.  Skin: Skin is warm and dry.  Nursing note and vitals reviewed.    UC Treatments / Results  Labs (all labs ordered are listed, but only abnormal results are displayed) Labs Reviewed  POCT RAPID STREP A    EKG  EKG Interpretation None       Radiology No results found.  Procedures Procedures (including critical care time)  Medications Ordered in UC Medications - No data to display   Initial Impression / Assessment and Plan / UC Course  I have reviewed the triage vital signs and the nursing notes.  Pertinent labs & imaging results that were available during my care of the patient were reviewed by me and considered in my medical decision  making (see chart for details).     Final Clinical Impressions(s) / UC Diagnoses   Final diagnoses:  Viral syndrome    New Prescriptions New Prescriptions   IPRATROPIUM (ATROVENT) 0.03 % NASAL SPRAY    Place 2 sprays into both nostrils 2 (two) times daily.     Elvina Sidle, MD 07/31/16 1217

## 2016-07-31 NOTE — ED Triage Notes (Signed)
Here for cold sx onset 2 days associated w/dry cough, ST, PND, chest tightness that radiates to the back  Hx of panic attack  Denies fevers, chills  A&O x4... NAD

## 2016-08-01 LAB — CULTURE, GROUP A STREP (THRC)

## 2016-10-06 ENCOUNTER — Encounter (HOSPITAL_COMMUNITY): Payer: Self-pay | Admitting: Emergency Medicine

## 2016-10-06 ENCOUNTER — Emergency Department (HOSPITAL_COMMUNITY)
Admission: EM | Admit: 2016-10-06 | Discharge: 2016-10-06 | Disposition: A | Discharge status: Home / Self Care | Payer: Medicaid Other | Attending: Emergency Medicine | Admitting: Emergency Medicine

## 2016-10-06 ENCOUNTER — Emergency Department (HOSPITAL_COMMUNITY): Payer: Medicaid Other

## 2016-10-06 DIAGNOSIS — F17200 Nicotine dependence, unspecified, uncomplicated: Secondary | ICD-10-CM | POA: Insufficient documentation

## 2016-10-06 DIAGNOSIS — M25511 Pain in right shoulder: Secondary | ICD-10-CM | POA: Insufficient documentation

## 2016-10-06 MED ORDER — PREDNISONE 10 MG PO TABS: ORAL_TABLET | ORAL | 0 refills | Status: DC

## 2016-10-06 NOTE — ED Triage Notes (Signed)
Patient complaining of right shoulder pain "for months." Denies injury.  

## 2016-10-06 NOTE — ED Provider Notes (Signed)
[facility] Note   CSN: [id number] Arrival date & time: 10/06/16  1653     History   Chief Complaint Chief Complaint  Patient presents with  . Shoulder Pain    HPI [name] is a 31 y.o. female.  HPI  [name] is a 31 y.o. female who presents to the Emergency Department complaining of Persistent right shoulder pain for 2 months. She reports a history of "rotator cuff issues" of the left shoulder in the past and states that she is recently feeling similar symptoms in her right shoulder. 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 She describes stiffness to her right shoulder in the morning that improves throughout the day. Pain is also improved from taking ibuprofen.  Mild pain to the right side of her neck and is associated with arm movement. She denies recent injury, swelling, numbness or weakness of her right upper extremity and rash   Past Medical History:  Diagnosis Date  . Depression   . GERD (gastroesophageal reflux disease)   . Hx of percutaneous transcatheter closure of congenital VSD   . Migraine headache with aura   . Panic attack     Patient Active Problem List   Diagnosis Date Noted  . Migraine headache with aura   . GERD 12/01/2006    Past Surgical History:  Procedure Laterality Date  . CARDIAC SURGERY     as a baby  . TONSILLECTOMY      OB History    Gravida Para Term Preterm AB Living   2 2 2     2    SAB TAB Ectopic Multiple Live Births           2       Home Medications    Prior to Admission medications   Medication Sig Start Date End Date Taking? Authorizing Provider  ipratropium (ATROVENT) 0.03 % nasal spray Place 2 sprays into both nostrils 2 (two) times daily. 07/31/16   Elvina Sidle, MD    Family History Family History  Problem Relation Age of Onset  . Congestive Heart Failure Other   . Asthma Paternal Aunt   . Congestive Heart Failure Paternal Uncle   .  Cancer Paternal Grandmother        breast  . Stroke Paternal Grandfather   . Asthma Cousin     Social History Social History  Substance Use Topics  . Smoking status: Current Some Day Smoker    Last attempt to quit: 08/16/2010  . Smokeless tobacco: Never Used  . Alcohol use Yes     Comment: socially     Allergies   Patient has no known allergies.   Review of Systems Review of Systems  Constitutional: Negative for chills and fever.  Respiratory: Negative for chest tightness and shortness of breath.   Cardiovascular: Negative for chest pain.  Gastrointestinal: Negative for abdominal pain.  Genitourinary: Negative for difficulty urinating and dysuria.  Musculoskeletal: Positive for arthralgias (Right shoulder pain) and neck pain. Negative for joint swelling.  Skin: Negative for color change, rash and wound.  Neurological: Negative for dizziness, weakness, numbness and headaches.  All other systems reviewed and are negative.    Physical Exam Updated Vital Signs BP 118/69 (BP Location: Right Arm)   Pulse 88   Temp 97.9 F (36.6 C) (Oral)   Resp 15   Ht 5\' 2"  (1.575 m)   Wt 83.9 kg (185 lb)   LMP 09/07/2016  SpO2 97%   BMI 33.84 kg/m   Physical Exam  Constitutional: She is oriented to person, place, and time. She appears well-developed and well-nourished. No distress.  HENT:  Head: Atraumatic.  Neck: Normal range of motion and full passive range of motion without pain. Neck supple. No thyromegaly present.  Cardiovascular: Normal rate, regular rhythm and intact distal pulses.   No murmur heard. Pulmonary/Chest: Effort normal and breath sounds normal. No respiratory distress. She exhibits no tenderness.  Abdominal: Soft. She exhibits no distension. There is no tenderness.  Musculoskeletal: Normal range of motion. She exhibits tenderness. She exhibits no edema.  Tenderness to palpation of the right trapezius muscle and anterior right shoulder joint. Patient has full  range of motion. Pain reproduced on abduction. No motor weakness.  Lymphadenopathy:    She has no cervical adenopathy.  Neurological: She is alert and oriented to person, place, and time. She has normal strength. No sensory deficit. She exhibits normal muscle tone. Coordination normal.  Reflex Scores:      Tricep reflexes are 1+ on the right side and 2+ on the left side.      Bicep reflexes are 1+ on the right side and 2+ on the left side. Skin: Skin is warm and dry. Capillary refill takes less than 2 seconds.  Psychiatric: She has a normal mood and affect.  Nursing note and vitals reviewed.    ED Treatments / Results  Labs (all labs ordered are listed, but only abnormal results are displayed) Labs Reviewed - No data to display  EKG  EKG Interpretation None       Radiology Dg Shoulder Right  Result Date: 10/06/2016 CLINICAL DATA:  Right shoulder pain for 2 months, no known injury EXAM: RIGHT SHOULDER - 2+ VIEW COMPARISON:  None. FINDINGS: Glenohumeral joint is intact. No evidence of scapular fracture or humeral fracture. The acromioclavicular joint is intact. IMPRESSION: No acute cardiopulmonary process. Electronically Signed   By: Genevive BiStewart  Edmunds M.D.   On: 10/06/2016 17:45    Procedures Procedures (including critical care time)  Medications Ordered in ED Medications - No data to display   Initial Impression / Assessment and Plan / ED Course  I have reviewed the triage vital signs and the nursing notes.  Pertinent labs & imaging results that were available during my care of the patient were reviewed by me and considered in my medical decision making (see chart for details).     Patient with likely inflammatory process of the right shoulder. X-ray reassuring. No concerning symptoms for septic joint. No neurovascular or motor deficits. Patient agrees to treatment with ice, rest, and prednisone taper pack. Referral information given for orthopedics.  Final Clinical  Impressions(s) / ED Diagnoses   Final diagnoses:  Acute pain of right shoulder    New Prescriptions New Prescriptions   No medications on file     Rosey Bathriplett, Toben Acuna, PA-C 10/06/16 1817    Mesner, Barbara CowerJason, MD 10/09/16 [phone]

## 2016-10-06 NOTE — Discharge Instructions (Signed)
Apply ice packs on/off to your shoulder.  Call Dr. Mort SawyersHarrison's office to arrange a follow-up appt. In one week if not improving

## 2016-11-17 ENCOUNTER — Encounter (HOSPITAL_COMMUNITY): Payer: Self-pay | Admitting: Emergency Medicine

## 2016-11-17 ENCOUNTER — Ambulatory Visit (HOSPITAL_COMMUNITY)
Admission: EM | Admit: 2016-11-17 | Discharge: 2016-11-17 | Disposition: A | Discharge status: Home / Self Care | Payer: Medicaid Other | Attending: Family Medicine | Admitting: Family Medicine

## 2016-11-17 DIAGNOSIS — J069 Acute upper respiratory infection, unspecified: Secondary | ICD-10-CM

## 2016-11-17 MED ORDER — IPRATROPIUM BROMIDE 0.03 % NA SOLN: 2.0000 | Freq: Two times a day (BID) | NASAL | 0 refills | Status: DC

## 2016-11-17 NOTE — ED Triage Notes (Signed)
Onset Friday of headache, sinus pressure and congestion, stuffy nose, runny nose.  Patient feels very tired and the need to sleep

## 2016-11-17 NOTE — Discharge Instructions (Signed)
Start nasal spray for congestion. Continue over-the-counter decongestants, allergy medicine. Nasal saline rinses such as neti pot. Tylenol/Motrin for fever and pain. Monitor for worsening of symptoms, shortness of breath, wheezing, continued sinus pain, follow-up for reevaluation is needed.

## 2016-11-17 NOTE — ED Provider Notes (Signed)
[facility]    CSN: [id number] Arrival date & time: 11/17/16  1238     History   Chief Complaint Chief Complaint  Patient presents with  . URI    HPI [name] is a 31 y.o. female.   31 year old female with history of depression, GERD, migraine comes in for 3 day history of nasal congestion, sinus pressure, headache. States has had intermittent cough an hour ago, otherwise no cough. Denies chest pain, shortness of breath, wheezing. Denies fever, chills, night sweats. Denies ear pain, eye pain, abdominal pain, nausea, vomiting, diarrhea. She has taken over-the-counter DayQuil with some relief. Denies any sick contact.      Past Medical History:  Diagnosis Date  . Depression   . GERD (gastroesophageal reflux disease)   . Hx of percutaneous transcatheter closure of congenital VSD   . Migraine headache with aura   . Panic attack     Patient Active Problem List   Diagnosis Date Noted  . Migraine headache with aura   . GERD 12/01/2006    Past Surgical History:  Procedure Laterality Date  . CARDIAC SURGERY     as a baby  . TONSILLECTOMY      OB History    Gravida Para Term Preterm AB Living   2 2 2     2    SAB TAB Ectopic Multiple Live Births           2       Home Medications    Prior to Admission medications   Medication Sig Start Date End Date Taking? Authorizing Provider  ipratropium (ATROVENT) 0.03 % nasal spray Place 2 sprays into both nostrils 2 (two) times daily. 11/17/16   Belinda FisherYu, Amy V, PA-C    Family History Family History  Problem Relation Age of Onset  . Congestive Heart Failure Other   . Asthma Paternal Aunt   . Congestive Heart Failure Paternal Uncle   . Cancer Paternal Grandmother        breast  . Stroke Paternal Grandfather   . Asthma Cousin     Social History Social History  Substance Use Topics  . Smoking status: Current Some Day Smoker    Last attempt to quit: 08/16/2010  . Smokeless tobacco: Never Used  .  Alcohol use Yes     Comment: socially     Allergies   Patient has no known allergies.   Review of Systems Review of Systems  Reason unable to perform ROS: HPI as above.     Physical Exam Triage Vital Signs ED Triage Vitals  Enc Vitals Group     BP 11/17/16 1342 106/71     Pulse Rate 11/17/16 1342 83     Resp 11/17/16 1342 20     Temp 11/17/16 1342 98 F (36.7 C)     Temp Source 11/17/16 1342 Oral     SpO2 11/17/16 1342 96 %     Weight --      Height --      Head Circumference --      Peak Flow --      Pain Score 11/17/16 1340 4     Pain Loc --      Pain Edu? --      Excl. in GC? --    No data found.   Updated Vital Signs BP 106/71 (BP Location: Left Arm)   Pulse 83   Temp 98 F (36.7 C) (Oral)   Resp 20   LMP  11/05/2016   SpO2 96%   Visual Acuity Right Eye Distance:   Left Eye Distance:   Bilateral Distance:    Right Eye Near:   Left Eye Near:    Bilateral Near:     Physical Exam  Constitutional: She is oriented to person, place, and time. She appears well-developed and well-nourished. No distress.  HENT:  Head: Normocephalic and atraumatic.  Right Ear: External ear and ear canal normal. Tympanic membrane is not erythematous and not bulging. A middle ear effusion is present.  Left Ear: External ear and ear canal normal. Tympanic membrane is not erythematous and not bulging. A middle ear effusion is present.  Nose: Right sinus exhibits maxillary sinus tenderness and frontal sinus tenderness. Left sinus exhibits maxillary sinus tenderness and frontal sinus tenderness.  Mouth/Throat: Uvula is midline, oropharynx is clear and moist and mucous membranes are normal.  Eyes: Pupils are equal, round, and reactive to light. Conjunctivae are normal.  Neck: Normal range of motion. Neck supple.  Cardiovascular: Normal rate, regular rhythm and normal heart sounds.  Exam reveals no gallop and no friction rub.   No murmur heard. Pulmonary/Chest: Effort normal and  breath sounds normal. She has no decreased breath sounds. She has no wheezes. She has no rhonchi. She has no rales.  Lymphadenopathy:    She has no cervical adenopathy.  Neurological: She is alert and oriented to person, place, and time.  Skin: Skin is warm and dry.     UC Treatments / Results  Labs (all labs ordered are listed, but only abnormal results are displayed) Labs Reviewed - No data to display  EKG  EKG Interpretation None       Radiology No results found.  Procedures Procedures (including critical care time)  Medications Ordered in UC Medications - No data to display   Initial Impression / Assessment and Plan / UC Course  I have reviewed the triage vital signs and the nursing notes.  Pertinent labs & imaging results that were available during my care of the patient were reviewed by me and considered in my medical decision making (see chart for details).     Start nasal spray for nasal congestion. Continue over-the-counter antihistamines and decongestants. Can start nasal saline rinses. Discussed with patient given 3 day history of sinus pressure, no indication of antibiotic use as of right now. Take Tylenol/Motrin for pain and fever. Monitor for worsening of symptoms, shortness of breath, wheezing, trouble breathing, symptoms not resolving follow-up for reevaluation.  Final Clinical Impressions(s) / UC Diagnoses   Final diagnoses:  Acute upper respiratory infection    New Prescriptions Current Discharge Medication List       Lurline Idol 11/17/16 1419

## 2016-12-30 ENCOUNTER — Ambulatory Visit (HOSPITAL_COMMUNITY)
Admission: EM | Admit: 2016-12-30 | Discharge: 2016-12-30 | Disposition: A | Discharge status: Home / Self Care | Payer: Medicaid Other

## 2016-12-30 ENCOUNTER — Encounter (HOSPITAL_COMMUNITY): Payer: Self-pay | Admitting: Emergency Medicine

## 2016-12-30 DIAGNOSIS — H9192 Unspecified hearing loss, left ear: Secondary | ICD-10-CM

## 2016-12-30 DIAGNOSIS — H6993 Unspecified Eustachian tube disorder, bilateral: Secondary | ICD-10-CM

## 2016-12-30 DIAGNOSIS — H6983 Other specified disorders of Eustachian tube, bilateral: Secondary | ICD-10-CM

## 2016-12-30 DIAGNOSIS — H73893 Other specified disorders of tympanic membrane, bilateral: Secondary | ICD-10-CM

## 2016-12-30 DIAGNOSIS — T700XXA Otitic barotrauma, initial encounter: Secondary | ICD-10-CM

## 2016-12-30 NOTE — ED Provider Notes (Signed)
[facility]    CSN: [id number] Arrival date & time: 12/30/16  1248     History   Chief Complaint Chief Complaint  Patient presents with  . Ear Fullness    HPI [name] is a 31 y.o. female.   31 year old female states that her left ear is been feeling stopped up over the past 24 hours and getting worse. She states the noise around her began to feel muffled and the vibrations in the ear will vary and comfortable when she had to leave the environment. She actually denies actual ear pain. She has a history of seasonal allergies and some PND. Denies recent ear trauma. No fever or chills.      Past Medical History:  Diagnosis Date  . Depression   . GERD (gastroesophageal reflux disease)   . Hx of percutaneous transcatheter closure of congenital VSD   . Migraine headache with aura   . Panic attack     Patient Active Problem List   Diagnosis Date Noted  . Migraine headache with aura   . GERD 12/01/2006    Past Surgical History:  Procedure Laterality Date  . CARDIAC SURGERY     as a baby  . TONSILLECTOMY      OB History    Gravida Para Term Preterm AB Living   SAB TAB Ectopic Multiple Live Births           2       Home Medications    Prior to Admission medications   Medication Sig Start Date End Date Taking? Authorizing Provider  ipratropium (ATROVENT) 0.03 % nasal spray Place 2 sprays into both nostrils 2 (two) times daily. 11/17/16   Belinda Fisher, PA-C    Family History Family History  Problem Relation Age of Onset  . Congestive Heart Failure Other   . Asthma Paternal Aunt   . Congestive Heart Failure Paternal Uncle   . Cancer Paternal Grandmother        breast  . Stroke Paternal Grandfather   . Asthma Cousin     Social History Social History  Substance Use Topics  . Smoking status: Current Some Day Smoker    Last attempt to quit: 08/16/2010  . Smokeless tobacco: Never Used  . Alcohol use Yes     Comment: socially       Allergies   Patient has no known allergies.   Review of Systems Review of Systems  Constitutional: Negative.  Negative for fever.  HENT: Positive for hearing loss, postnasal drip and rhinorrhea. Negative for congestion and tinnitus.   Respiratory: Negative.   Gastrointestinal: Negative.   Neurological: Negative.   All other systems reviewed and are negative.    Physical Exam Triage Vital Signs ED Triage Vitals [12/30/16 1345]  Enc Vitals Group     BP 120/77     Pulse Rate 83     Resp 16     Temp 98.5 F (36.9 C)     Temp src      SpO2 99 %     Weight 185 lb (83.9 kg)     Height  (1.575 m)     Head Circumference      Peak Flow      Pain Score 0     Pain Loc      Pain Edu?      Excl. in GC?    No data found.  Updated Vital Signs BP 120/77   Pulse 83   Temp 98.5 F (36.9 C)   Resp 16   Ht  (1.575 m)   Wt 185 lb (83.9 kg)   LMP 12/03/2016   SpO2 99%   BMI 33.84 kg/m   Visual Acuity Right Eye Distance:   Left Eye Distance:   Bilateral Distance:    Right Eye Near:   Left Eye Near:    Bilateral Near:     Physical Exam  Constitutional: She is oriented to person, place, and time. She appears well-developed and well-nourished. No distress.  HENT:  Right Ear: External ear normal.  Left Ear: External ear normal.  Mouth/Throat: No oropharyngeal exudate.  Right TM transparent and mildly retracted. No erythema or effusion. EAC is clear.  Left TM severely retracted, trans parent, distorted light reflex, pearly gray, no apparent effusion or bulging or erythema. EAC is clear.  Oropharynx with streaky erythema and scant clear PND. No swelling.  Eyes: EOM are normal.  Neck: Normal range of motion. Neck supple.  Cardiovascular: Normal rate.   Pulmonary/Chest: Effort normal and breath sounds normal. No respiratory distress.  Musculoskeletal: She exhibits no edema.  Lymphadenopathy:    She has no cervical adenopathy.  Neurological: She is  alert and oriented to person, place, and time. She exhibits normal muscle tone.  Skin: Skin is warm and dry.  Psychiatric: She has a normal mood and affect.  Nursing note and vitals reviewed.    UC Treatments / Results  Labs (all labs ordered are listed, but only abnormal results are displayed) Labs Reviewed - No data to display  EKG  EKG Interpretation None       Radiology No results found.  Procedures Procedures (including critical care time)  Medications Ordered in UC Medications - No data to display   Initial Impression / Assessment and Plan / UC Course  I have reviewed the triage vital signs and the nursing notes.  Pertinent labs & imaging results that were available during my care of the patient were reviewed by me and considered in my medical decision making (see chart for details).    Take the Sudafed PE 10 mg every 4 hours as a decongestant. In addition, Allegra 180 mg a day. May also want to try Flonase or Rhinocort for a couple weeks if needed that long. The ears are clear, no wax, and no evidence of infection or fluid in the ear. The eardrums are retracted likely due to inflammation or obstruction of the eustachian tubes.     Final Clinical Impressions(s) / UC Diagnoses   Final diagnoses:  Decreased hearing of left ear  Tympanic membrane retraction, bilateral  Eustachian tube dysfunction, bilateral  Barotitis media, initial encounter    New Prescriptions New Prescriptions   No medications on file     Controlled Substance Prescriptions [place] Controlled Substance Registry consulted? Not Applicable   Hayden Rasmussen, NP 12/30/16 1517

## 2016-12-30 NOTE — Discharge Instructions (Signed)
Take the Sudafed PE 10 mg every 4 hours as a decongestant. In addition, Allegra 180 mg a day. May also want to try Flonase or Rhinocort for a couple weeks if needed that long. The ears are clear, no wax, and no evidence of infection or fluid in the ear. The eardrums are retracted likely due to inflammation or obstruction of the eustachian tubes.

## 2016-12-30 NOTE — ED Triage Notes (Signed)
PT reports muffled hearing and fullness in left ear.

## 2017-05-18 ENCOUNTER — Encounter (HOSPITAL_COMMUNITY): Payer: Self-pay | Admitting: Emergency Medicine

## 2017-05-18 ENCOUNTER — Ambulatory Visit (HOSPITAL_COMMUNITY)
Admission: EM | Admit: 2017-05-18 | Discharge: 2017-05-18 | Disposition: A | Discharge status: Home / Self Care | Payer: Medicaid Other | Attending: Urgent Care | Admitting: Urgent Care

## 2017-05-18 ENCOUNTER — Other Ambulatory Visit: Payer: Self-pay

## 2017-05-18 DIAGNOSIS — B9789 Other viral agents as the cause of diseases classified elsewhere: Secondary | ICD-10-CM | POA: Diagnosis not present

## 2017-05-18 DIAGNOSIS — J069 Acute upper respiratory infection, unspecified: Secondary | ICD-10-CM | POA: Diagnosis not present

## 2017-05-18 DIAGNOSIS — R52 Pain, unspecified: Secondary | ICD-10-CM

## 2017-05-18 MED ORDER — BENZONATATE 100 MG PO CAPS: 100.0000 mg | ORAL_CAPSULE | Freq: Three times a day (TID) | ORAL | 0 refills | Status: DC | PRN

## 2017-05-18 NOTE — ED Provider Notes (Signed)
  MRN: [id number] DOB: 1986/03/21  Subjective:   [name] is a 32 y.o. female presenting for 1 day history of dry cough, body aches, chest congestion. Has tried Corcidin. Use Vape, does not smoke cigarettes. Denies history of allergies, asthma.   Review of Systems  Constitutional: Negative for chills and fever.  HENT: Negative for ear discharge, ear pain and sinus pain.   Respiratory: Negative for hemoptysis, sputum production, shortness of breath and wheezing.   Cardiovascular: Negative for chest pain.  Gastrointestinal: Negative for abdominal pain, nausea and vomiting.  Skin: Negative for rash.    [name] has No Known Allergies.  [name]  has a past medical history of Depression, GERD (gastroesophageal reflux disease), percutaneous transcatheter closure of congenital VSD, Migraine headache with aura, and Panic attack. Also  has a past surgical history that includes Tonsillectomy and Cardiac surgery.  Objective:   Vitals: BP 121/70   Pulse 98   Temp 98.6 F (37 C)   Resp 18   SpO2 100%   Physical Exam  Constitutional: She is oriented to person, place, and time. She appears well-developed and well-nourished.  HENT:  TM's intact bilaterally, no effusions or erythema. Nasal turbinates dry erythematous, nasal passages patent. No sinus tenderness. Oropharynx with mild post-nasal drainage but no exudates, mucous membranes moist.  Eyes: Right eye exhibits no discharge. Left eye exhibits no discharge.  Neck: Normal range of motion. Neck supple.  Cardiovascular: Normal rate, regular rhythm and intact distal pulses. Exam reveals no gallop and no friction rub.  No murmur heard. Pulmonary/Chest: No respiratory distress. She has no wheezes. She has no rales.  Lymphadenopathy:    She has no cervical adenopathy.  Neurological: She is alert and oriented to person, place, and time.  Skin: Skin is warm and dry.  Psychiatric: She has a normal mood and affect.   Assessment and Plan :    Viral URI with cough  Body aches  Start supportive care for viral illness. Return-to-clinic precautions discussed, patient verbalized understanding.    Wallis BambergMani, Ansar Skoda, PA-C 05/18/17 1348

## 2017-05-18 NOTE — Discharge Instructions (Signed)
You may take 500mg Tylenol with ibuprofen 400-600mg every 6 hours for pain and inflammation. Hydrate well with at least 2 liters (1 gallon) of water daily. For sore throat try using a honey-based tea. Use 3 teaspoons of honey with juice squeezed from half lemon. Place shaved pieces of ginger into 1/2-1 cup of water and warm over stove top. Then mix the ingredients and repeat every 4 hours as needed.  °

## 2017-05-18 NOTE — ED Notes (Signed)
Called x1 in waiting room no answer 

## 2017-05-18 NOTE — ED Triage Notes (Signed)
Pt was asleep in the waiting area after several times she responded to calls. Pt c/o cold symptoms, body aches, cough, chest congestion.

## 2017-05-20 ENCOUNTER — Ambulatory Visit (HOSPITAL_COMMUNITY)
Admission: EM | Admit: 2017-05-20 | Discharge: 2017-05-20 | Disposition: A | Discharge status: Home / Self Care | Payer: Medicaid Other | Attending: Family Medicine | Admitting: Family Medicine

## 2017-05-20 ENCOUNTER — Encounter (HOSPITAL_COMMUNITY): Payer: Self-pay | Admitting: Emergency Medicine

## 2017-05-20 ENCOUNTER — Other Ambulatory Visit: Payer: Self-pay

## 2017-05-20 DIAGNOSIS — J069 Acute upper respiratory infection, unspecified: Secondary | ICD-10-CM

## 2017-05-20 DIAGNOSIS — B9789 Other viral agents as the cause of diseases classified elsewhere: Secondary | ICD-10-CM | POA: Diagnosis not present

## 2017-05-20 MED ORDER — HYDROCODONE-HOMATROPINE 5-1.5 MG/5ML PO SYRP: 5.0000 mL | ORAL_SOLUTION | Freq: Four times a day (QID) | ORAL | 0 refills | Status: DC | PRN

## 2017-05-20 NOTE — ED Provider Notes (Signed)
[facility]   [id number] 05/20/17 Arrival Time: 1206  ASSESSMENT & PLAN:  1. Viral URI with cough     Meds ordered this encounter  Medications  . HYDROcodone-homatropine (HYCODAN) 5-1.5 MG/5ML syrup    Sig: Take 5 mLs by mouth every 6 (six) hours as needed for cough.    Dispense:  90 mL    Refill:  0   Cough medication sedation precautions. Discussed typical duration of symptoms. OTC symptom care as needed. Ensure adequate fluid intake and rest. May f/u with PCP or here as needed.  Reviewed expectations re: course of current medical issues. Questions answered. Outlined signs and symptoms indicating need for more acute intervention. Patient verbalized understanding. After Visit Summary given.   SUBJECTIVE: History from: patient.  [name] is a 32 y.o. female who presents with complaint of nasal congestion, post-nasal drainage, and a persistent dry cough. Onset abrupt, approximately several days ago. Overall fatigued with body aches. SOB: none. Wheezing: none. Fever: yes, subjective. Overall normal PO intake without n/v. Sick contacts: no. OTC treatment: cough medication without relief.   Social History   Tobacco Use  Smoking Status Current Some Day Smoker  . Last attempt to quit: 08/16/2010  . Years since quitting: 6.7  Smokeless Tobacco Never Used    ROS: As per HPI.   OBJECTIVE:  Vitals:   05/20/17 1325  BP: 130/85  Pulse: (!) 113  Resp: 18  Temp: 99.9 F (37.7 C)  SpO2: 100%     General appearance: alert; appears fatigued HEENT: nasal congestion; clear runny nose; throat irritation secondary to post-nasal drainage Neck: supple without LAD Lungs: unlabored respirations, symmetrical air entry; cough: moderate; no respiratory distress Skin: warm and dry Psychological: alert and cooperative; normal mood and affect   No Known Allergies  Past Medical History:  Diagnosis Date  . Depression   . GERD (gastroesophageal reflux disease)    . Hx of percutaneous transcatheter closure of congenital VSD   . Migraine headache with aura   . Panic attack    Family History  Problem Relation Age of Onset  . Congestive Heart Failure Other   . Asthma Paternal Aunt   . Congestive Heart Failure Paternal Uncle   . Cancer Paternal Grandmother        breast  . Stroke Paternal Grandfather   . Asthma Cousin    Social History   Socioeconomic History  . Marital status: Legally Separated    Spouse name: Not on file  . Number of children: Not on file  . Years of education: Not on file  . Highest education level: Not on file  Social Needs  . Financial resource strain: Not on file  . Food insecurity - worry: Not on file  . Food insecurity - inability: Not on file  . Transportation needs - medical: Not on file  . Transportation needs - non-medical: Not on file  Occupational History  . Not on file  Tobacco Use  . Smoking status: Current Some Day Smoker    Last attempt to quit: 08/16/2010    Years since quitting: 6.7  . Smokeless tobacco: Never Used  Substance and Sexual Activity  . Alcohol use: Yes    Comment: socially  . Drug use: No  . Sexual activity: Yes    Birth control/protection: None  Other Topics Concern  . Not on file  Social History Narrative  . Not on file           Mardella LaymanHagler, Abdoulaye Drum, MD  05/23/17 1939  

## 2017-05-20 NOTE — Discharge Instructions (Signed)

## 2017-05-20 NOTE — ED Triage Notes (Signed)
Pt c/o cough, was seen here two days ago and given tessolon. Pt states it helps a little bit but shes still coughing a lot.

## 2017-11-15 ENCOUNTER — Other Ambulatory Visit: Payer: Self-pay

## 2017-11-15 ENCOUNTER — Emergency Department (HOSPITAL_COMMUNITY)
Admission: EM | Admit: 2017-11-15 | Discharge: 2017-11-15 | Disposition: A | Discharge status: Home / Self Care | Payer: Medicaid Other | Attending: Emergency Medicine | Admitting: Emergency Medicine

## 2017-11-15 ENCOUNTER — Encounter (HOSPITAL_COMMUNITY): Payer: Self-pay | Admitting: Emergency Medicine

## 2017-11-15 DIAGNOSIS — F17210 Nicotine dependence, cigarettes, uncomplicated: Secondary | ICD-10-CM | POA: Insufficient documentation

## 2017-11-15 DIAGNOSIS — M545 Low back pain, unspecified: Secondary | ICD-10-CM

## 2017-11-15 LAB — PREGNANCY, URINE: PREG TEST UR: NEGATIVE

## 2017-11-15 LAB — URINALYSIS, ROUTINE W REFLEX MICROSCOPIC
BILIRUBIN URINE: NEGATIVE
Glucose, UA: NEGATIVE mg/dL
Hgb urine dipstick: NEGATIVE
Ketones, ur: NEGATIVE mg/dL
Nitrite: NEGATIVE
Protein, ur: NEGATIVE mg/dL
SPECIFIC GRAVITY, URINE: 1.011 (ref 1.005–1.030)
pH: 8 (ref 5.0–8.0)

## 2017-11-15 MED ORDER — IBUPROFEN 400 MG PO TABS
600.0000 mg | ORAL_TABLET | Freq: Once | ORAL | Status: AC
  Administered 2017-11-15: 600 mg via ORAL
  Filled 2017-11-15: qty 1

## 2017-11-15 NOTE — Discharge Instructions (Signed)
Recommend Tylenol/ibuprofen as needed for pain following dosing instructions on packaging.  May apply ice pack or heat pack for approximately 15 or 20 minutes a couple times a day.  Recommend gentle stretching exercises.  Follow-up with her primary care doctor for reevaluation 1 week if symptoms not improve.  Return if symptoms worsen.

## 2017-11-15 NOTE — ED Provider Notes (Signed)
[facility] EMERGENCY DEPARTMENT Provider Note   CSN: [id number] Arrival date & time: 11/15/17  1910     History   Chief Complaint Chief Complaint  Patient presents with  . Flank Pain    HPI [name] is a 32 y.o. female.  HPI 84 old female with history of obesity, depression, and GERD presents to the emergency department today for evaluation of right flank/low back pain.  States that she has had aching pain and sometimes radiates up her back.  States she is concerned that she may have a urine infection.  Denies any dysuria or hematuria.  Has had nausea earlier this week that has since resolved.  No vomiting.  Continues to have normal bowel movements.  No hematochezia or melena.  No fevers or chills.  Continues to tolerate p.o.  Did have decreased appetite but eating well.  Recently started going to the gym and doing cardio workouts approximately 1 month ago.  No midline back pain.  No numbness or weakness.  Denies any abdominal pain. Pain worse w movmenent, better w rest. Works as Associate Professor.   Past Medical History:  Diagnosis Date  . Depression   . GERD (gastroesophageal reflux disease)   . Hx of percutaneous transcatheter closure of congenital VSD   . Migraine headache with aura   . Panic attack     Patient Active Problem List   Diagnosis Date Noted  . Migraine headache with aura   . GERD 12/01/2006    Past Surgical History:  Procedure Laterality Date  . CARDIAC SURGERY     as a baby  . TONSILLECTOMY       OB History    Gravida  2   Para  2   Term  2   Preterm      AB      Living  2     SAB      TAB      Ectopic      Multiple      Live Births  2           Home Medications    Prior to Admission medications   Medication Sig Start Date End Date Taking? Authorizing Provider  benzonatate (TESSALON) 100 MG capsule Take 1-2 capsules (100-200 mg total) by mouth 3 (three) times daily as needed for cough. 05/18/17   Wallis Bamberg, PA-C  HYDROcodone-homatropine [facility]) 5-1.5 MG/5ML syrup Take 5 mLs by mouth every 6 (six) hours as needed for cough. 05/20/17   Mardella Layman, MD  ipratropium (ATROVENT) 0.03 % nasal spray Place 2 sprays into both nostrils 2 (two) times daily. 11/17/16   Belinda Fisher, PA-C    Family History Family History  Problem Relation Age of Onset  . Congestive Heart Failure Other   . Asthma Paternal Aunt   . Congestive Heart Failure Paternal Uncle   . Cancer Paternal Grandmother        breast  . Stroke Paternal Grandfather   . Asthma Cousin     Social History Social History   Tobacco Use  . Smoking status: Current Some Day Smoker    Last attempt to quit: 08/16/2010    Years since quitting: 7.2  . Smokeless tobacco: Never Used  Substance Use Topics  . Alcohol use: Yes    Comment: socially  . Drug use: No     Allergies   Patient has no known allergies.   Review of Systems Review of Systems  Constitutional:  Negative for chills and fever.  HENT: Negative for congestion and sore throat.   Eyes: Negative for visual disturbance.  Respiratory: Negative for cough and shortness of breath.   Cardiovascular: Negative for chest pain and leg swelling.  Gastrointestinal: Negative for abdominal pain, diarrhea, nausea (resolved earlier this week) and vomiting.  Genitourinary: Positive for flank pain. Negative for dysuria and hematuria.  Musculoskeletal: Positive for back pain (right lower). Negative for neck pain.  Skin: Negative for color change and rash.  Neurological: Negative for weakness and headaches.  All other systems reviewed and are negative.    Physical Exam Updated Vital Signs BP 116/71   Pulse 89   Temp 98.3 F (36.8 C) (Oral)   Resp 18   Ht 5\' 2"  (1.575 m)   Wt 83.9 kg   LMP 11/08/2017   SpO2 98%   BMI 33.84 kg/m   Physical Exam  Constitutional: She appears well-developed and well-nourished. No distress.  HENT:  Head: Normocephalic and atraumatic.  Eyes:  Conjunctivae are normal.  Neck: Neck supple.  Cardiovascular: Regular rhythm and intact distal pulses.  Pulmonary/Chest: Effort normal and breath sounds normal. No respiratory distress.  Abdominal: Soft. She exhibits no distension. There is no tenderness.  Negative Rovsing/obturator/psoas/Murphy sign.  Musculoskeletal: She exhibits no edema.  Reproducible right lumbar pain. No midline bony pain. No rash.   Neurological: She is alert.  No saddle anesthesia. Ambulates w/o difficulty or ataxia.   Skin: Skin is warm and dry.  Psychiatric: She has a normal mood and affect.  Nursing note and vitals reviewed.    ED Treatments / Results  Labs (all labs ordered are listed, but only abnormal results are displayed) Labs Reviewed  URINALYSIS, ROUTINE W REFLEX MICROSCOPIC - Abnormal; Notable for the following components:      Result Value   APPearance HAZY (*)    Leukocytes, UA SMALL (*)    Bacteria, UA RARE (*)    All other components within normal limits  PREGNANCY, URINE    EKG None  Radiology No results found.  Procedures Procedures (including critical care time)  Medications Ordered in ED Medications  ibuprofen (ADVIL,MOTRIN) tablet 600 mg (has no administration in time range)     Initial Impression / Assessment and Plan / ED Course  I have reviewed the triage vital signs and the nursing notes.  Pertinent labs & imaging results that were available during my care of the patient were reviewed by me and considered in my medical decision making (see chart for details).    [age] old female with history of obesity, depression, and GERD presents to the emergency department today for evaluation of right flank/low back pain.   Patient afebrile, hemodynamically stable, well-appearing.  History and exam as above.  Does not appear dehydrated.  No vomiting or diarrhea.  Reassuring abdominal exam.  No findings on exam to concern for appendicitis or cholecystitis.  No signs of obstruction.   No peritonitis.  Pain is more lumbar musculature and reproducible.  No significant CVA tenderness. No midline back pain, saddle anesthesia, or concerns for cauda equina.  Urine pregnancy test negative.  Urinalysis with no evidence of infection.  Not consistent with pyelonephritis or cystitis given no urinary symptoms.  No vaginal discharge or bleeding.  Not concerned for PID.  Do not feel the patient needs further labs at this time nor imaging given mild pain.  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  Discussed  symptom Materials engineer.  Strict return precautions provided.  Stable discharge. Tylenol/motrin.   Case and plan of care discussed with Dr. Particia Nearing.   Final Clinical Impressions(s) / ED Diagnoses   Final diagnoses:  Acute right-sided low back pain without sciatica    ED Discharge Orders    None       Rigoberto Noel, MD 11/15/17 2112    Jacalyn Lefevre, MD 11/15/17 2224

## 2017-11-15 NOTE — ED Triage Notes (Addendum)
Pt reports R flank pain x 3 days. Pt reports it has worsened and feels aching and sharp. Pt reports some nausea, denies vomiting. Pt also reports headache. Pt denies any urinary symptoms

## 2017-12-15 ENCOUNTER — Ambulatory Visit (HOSPITAL_COMMUNITY)
Admission: EM | Admit: 2017-12-15 | Discharge: 2017-12-15 | Disposition: A | Discharge status: Home / Self Care | Payer: Medicaid Other | Attending: Family Medicine | Admitting: Family Medicine

## 2017-12-15 ENCOUNTER — Encounter (HOSPITAL_COMMUNITY): Payer: Self-pay | Admitting: Emergency Medicine

## 2017-12-15 DIAGNOSIS — N83209 Unspecified ovarian cyst, unspecified side: Secondary | ICD-10-CM | POA: Diagnosis not present

## 2017-12-15 DIAGNOSIS — F410 Panic disorder [episodic paroxysmal anxiety] without agoraphobia: Secondary | ICD-10-CM | POA: Insufficient documentation

## 2017-12-15 DIAGNOSIS — N926 Irregular menstruation, unspecified: Secondary | ICD-10-CM | POA: Diagnosis not present

## 2017-12-15 DIAGNOSIS — Z87891 Personal history of nicotine dependence: Secondary | ICD-10-CM | POA: Insufficient documentation

## 2017-12-15 DIAGNOSIS — F329 Major depressive disorder, single episode, unspecified: Secondary | ICD-10-CM | POA: Diagnosis not present

## 2017-12-15 DIAGNOSIS — G43109 Migraine with aura, not intractable, without status migrainosus: Secondary | ICD-10-CM | POA: Insufficient documentation

## 2017-12-15 DIAGNOSIS — K219 Gastro-esophageal reflux disease without esophagitis: Secondary | ICD-10-CM | POA: Insufficient documentation

## 2017-12-15 LAB — HCG, QUANTITATIVE, PREGNANCY: hCG, Beta Chain, Quant, S: <1 m[IU]/mL (ref <5)

## 2017-12-15 NOTE — Discharge Instructions (Addendum)
It was nice meeting you!!  I believe that your missed period is from your new exercise regimen, diet and possibly hormonal changes. I did not see anything on your exam to worry me.  We are running the pregnancy blood test to be sure your not pregnant. I will call you if positive. Make sure that you follow up with your OB/GYN for further management and possible birth control.  Follow up as needed for continued or worsening symptoms

## 2017-12-15 NOTE — ED Triage Notes (Signed)
Pt states her LMP was beginning of August and It lasted one day, has taken several preg tests which come out negative. Pt denies symptoms.

## 2017-12-16 NOTE — ED Provider Notes (Signed)
[facility]    CSN: [id number] Arrival date & time: 12/15/17  1117     History   Chief Complaint Chief Complaint  Patient presents with  . Appointment    1130  . Late Period    HPI [name] is a 32 y.o. female.   She is a 32 year old female that presents for irregular bleeding.  Reports that she had her menstrual cycle on August 1, it lasted 1 day and then stopped.  A few days later she started spotting again.  This lasted 2 days and then stopped.  Reports it has been 40 days since her last normal menstrual cycle.  She was concerned for pregnancy so she took 2 pregnancy tests at home that were negative.  She also started a new diet and exercise routine 2 months ago before all this started.  The routine is pretty rigorous with a lot of cardio.  She denies any abdominal pain, pelvic pain, back pain, vaginal discharge, vaginal bleeding.  Denies any urinary symptoms.  Denies any concern for STDs.  She is sexually active with one partner and uses a condom for protection.  Reports she is unable to take oral contraceptive due to migraine history with aura.  She has had an IUD in the past.  Does have a history of ovarian cyst.  ROS per HPI      Past Medical History:  Diagnosis Date  . Depression   . GERD (gastroesophageal reflux disease)   . Hx of percutaneous transcatheter closure of congenital VSD   . Migraine headache with aura   . Panic attack     Patient Active Problem List   Diagnosis Date Noted  . Migraine headache with aura   . GERD 12/01/2006    Past Surgical History:  Procedure Laterality Date  . CARDIAC SURGERY     as a baby  . TONSILLECTOMY      OB History    Gravida  2   Para  2   Term  2   Preterm      AB      Living  2     SAB      TAB      Ectopic      Multiple      Live Births  2            Home Medications    Prior to Admission medications   Medication Sig Start Date End Date Taking? Authorizing Provider    benzonatate (TESSALON) 100 MG capsule Take 1-2 capsules (100-200 mg total) by mouth 3 (three) times daily as needed for cough. Patient not taking: Reported on 12/15/2017 05/18/17   Wallis Bamberg, PA-C  HYDROcodone-homatropine [facility]) 5-1.5 MG/5ML syrup Take 5 mLs by mouth every 6 (six) hours as needed for cough. Patient not taking: Reported on 12/15/2017 05/20/17   Mardella Layman, MD  ipratropium (ATROVENT) 0.03 % nasal spray Place 2 sprays into both nostrils 2 (two) times daily. Patient not taking: Reported on 12/15/2017 11/17/16   Lurline Idol    Family History Family History  Problem Relation Age of Onset  . Congestive Heart Failure Other   . Asthma Paternal Aunt   . Congestive Heart Failure Paternal Uncle   . Cancer Paternal Grandmother        breast  . Stroke Paternal Grandfather   . Asthma Cousin     Social History Social History   Tobacco Use  . Smoking status: Current Some  Day Smoker    Last attempt to quit: 08/16/2010    Years since quitting: 7.3  . Smokeless tobacco: Never Used  Substance Use Topics  . Alcohol use: Yes    Comment: socially  . Drug use: No     Allergies   Patient has no known allergies.   Review of Systems Review of Systems   Physical Exam Triage Vital Signs ED Triage Vitals  Enc Vitals Group     BP 12/15/17 1133 110/86     Pulse Rate 12/15/17 1133 100     Resp 12/15/17 1133 18     Temp 12/15/17 1133 97.7 F (36.5 C)     Temp src --      SpO2 12/15/17 1133 97 %     Weight --      Height --      Head Circumference --      Peak Flow --      Pain Score 12/15/17 1134 0     Pain Loc --      Pain Edu? --      Excl. in GC? --    No data found.  Updated Vital Signs BP 110/86   Pulse 100   Temp 97.7 F (36.5 C)   Resp 18   LMP 11/05/2017   SpO2 97%   Visual Acuity Right Eye Distance:   Left Eye Distance:   Bilateral Distance:    Right Eye Near:   Left Eye Near:    Bilateral Near:     Physical Exam  Constitutional: She  is oriented to person, place, and time. She appears well-developed and well-nourished.  Very pleasant. Non toxic or ill appearing.     HENT:  Head: Normocephalic and atraumatic.  Nose: Nose normal.  Eyes: Conjunctivae are normal.  Neck: Normal range of motion.  Abdominal: Soft. Bowel sounds are normal. She exhibits no distension and no mass. There is tenderness. There is no rebound and no guarding. No hernia.  No CVA tenderness.  Mild tenderness to right pelvic area.  Negative for masses or rebound.  Musculoskeletal: Normal range of motion.  Neurological: She is alert and oriented to person, place, and time.  Skin: Skin is warm and dry.  Psychiatric: She has a normal mood and affect.  Nursing note and vitals reviewed.    UC Treatments / Results  Labs (all labs ordered are listed, but only abnormal results are displayed) Labs Reviewed  HCG, QUANTITATIVE, PREGNANCY    EKG None  Radiology No results found.  Procedures Procedures (including critical care time)  Medications Ordered in UC Medications - No data to display  Initial Impression / Assessment and Plan / UC Course  I have reviewed the triage vital signs and the nursing notes.  Pertinent labs & imaging results that were available during my care of the patient were reviewed by me and considered in my medical decision making (see chart for details).     Irregular menstrual/missed period- most likely due to the new exercise routine, diet and hormones. Al of these factors can cause menstrual changes.  Hcg negative She needs to follow up with OB/GYN for further management.  Final Clinical Impressions(s) / UC Diagnoses   Final diagnoses:  Missed period     Discharge Instructions     It was nice meeting you!!  I believe that your missed period is from your new exercise regimen, diet and possibly hormonal changes. I did not see anything on your exam to worry me.  We are running the pregnancy blood test to be  sure your not pregnant. I will call you if positive. Make sure that you follow up with your OB/GYN for further management and possible birth control.  Follow up as needed for continued or worsening symptoms     ED Prescriptions    None     Controlled Substance Prescriptions [place] Controlled Substance Registry consulted? Not Applicable   Janace Aris, NP 12/16/17 1546

## 2018-04-30 ENCOUNTER — Ambulatory Visit (HOSPITAL_COMMUNITY)
Admission: EM | Admit: 2018-04-30 | Discharge: 2018-04-30 | Disposition: A | Discharge status: Home / Self Care | Payer: Medicaid Other | Attending: Family Medicine | Admitting: Family Medicine

## 2018-04-30 ENCOUNTER — Encounter (HOSPITAL_COMMUNITY): Payer: Self-pay

## 2018-04-30 DIAGNOSIS — J069 Acute upper respiratory infection, unspecified: Secondary | ICD-10-CM | POA: Diagnosis not present

## 2018-04-30 MED ORDER — TRIAMCINOLONE ACETONIDE 55 MCG/ACT NA AERO: 2.0000 | INHALATION_SPRAY | Freq: Every day | NASAL | 0 refills | Status: DC

## 2018-04-30 MED ORDER — AMOXICILLIN 875 MG PO TABS: 875.0000 mg | ORAL_TABLET | Freq: Two times a day (BID) | ORAL | 0 refills | Status: DC

## 2018-04-30 NOTE — ED Provider Notes (Signed)
[facility]    CSN: [id number] Arrival date & time: 04/30/18  1403     History   Chief Complaint Chief Complaint  Patient presents with  . Sinus Isues    HPI [name] is a 33 y.o. female.   HPI  Patient has had a week of sinus symptoms.  She states that they are getting worse.  Started off with some minor clear runny nose and sinus drainage.  Some postnasal drip.  No coughing or chest congestion.  Low-grade fever.  Over time she states she is feeling more more sick.  Very tired.  Yellow-green mucus.  Some streaks of blood.  She states the mucus is thick and she is having more pressure and pain in her sinuses.  Now she has some sore throat.  She states that the drainage smells bad and taste bad.  She feels like she has an infection that needs an antibiotic.  She has used over-the-counter products with no improvement  Past Medical History:  Diagnosis Date  . Depression   . GERD (gastroesophageal reflux disease)   . Hx of percutaneous transcatheter closure of congenital VSD   . Migraine headache with aura   . Panic attack     Patient Active Problem List   Diagnosis Date Noted  . Migraine headache with aura   . GERD 12/01/2006    Past Surgical History:  Procedure Laterality Date  . CARDIAC SURGERY     as a baby  . TONSILLECTOMY      OB History    Gravida  2   Para  2   Term  2   Preterm      AB      Living  2     SAB      TAB      Ectopic      Multiple      Live Births  2            Home Medications    Prior to Admission medications   Medication Sig Start Date End Date Taking? Authorizing Provider  amoxicillin (AMOXIL) 875 MG tablet Take 1 tablet (875 mg total) by mouth 2 (two) times daily. 04/30/18   Eustace Moore, MD  triamcinolone (NASACORT) 55 MCG/ACT AERO nasal inhaler Place 2 sprays into the nose daily. 04/30/18   Eustace Moore, MD    Family History Family History  Problem Relation Age of Onset  .  Congestive Heart Failure Other   . Asthma Paternal Aunt   . Congestive Heart Failure Paternal Uncle   . Cancer Paternal Grandmother        breast  . Stroke Paternal Grandfather   . Asthma Cousin     Social History Social History   Tobacco Use  . Smoking status: Current Some Day Smoker    Last attempt to quit: 08/16/2010    Years since quitting: 7.7  . Smokeless tobacco: Never Used  Substance Use Topics  . Alcohol use: Yes    Comment: socially  . Drug use: No     Allergies   Patient has no known allergies.   Review of Systems Review of Systems  Constitutional: Positive for fatigue. Negative for chills and fever.  HENT: Positive for congestion, postnasal drip, rhinorrhea, sinus pressure, sinus pain and sore throat. Negative for ear pain.   Eyes: Negative for pain and visual disturbance.  Respiratory: Negative for cough and shortness of breath.   Cardiovascular: Negative for chest pain  and palpitations.  Gastrointestinal: Negative for abdominal pain and vomiting.  Genitourinary: Negative for dysuria and hematuria.  Musculoskeletal: Negative for arthralgias and back pain.  Skin: Negative for color change and rash.  Neurological: Negative for seizures and syncope.  All other systems reviewed and are negative.    Physical Exam Triage Vital Signs ED Triage Vitals  Enc Vitals Group     BP 04/30/18 1511 131/85     Pulse Rate 04/30/18 1511 86     Resp 04/30/18 1511 18     Temp 04/30/18 1511 98.5 F (36.9 C)     Temp Source 04/30/18 1511 Oral     SpO2 04/30/18 1511 96 %     Weight --      Height --      Head Circumference --      Peak Flow --      Pain Score 04/30/18 1513 4     Pain Loc --      Pain Edu? --      Excl. in GC? --    No data found.  Updated Vital Signs BP 131/85 (BP Location: Right Arm)   Pulse 86   Temp 98.5 F (36.9 C) (Oral)   Resp 18   LMP 03/26/2018   SpO2 96%    Physical Exam Constitutional:      General: She is not in acute  distress.    Appearance: She is well-developed. She is obese. She is ill-appearing.  HENT:     Head: Normocephalic and atraumatic.     Right Ear: Tympanic membrane, ear canal and external ear normal.     Left Ear: Tympanic membrane, ear canal and external ear normal.     Nose: Congestion present.     Comments: Nasal congestion.  Maxillary sinuses are tender.  AC nodes are tender.  Posterior pharynx mildly red    Mouth/Throat:     Mouth: Mucous membranes are moist.     Pharynx: Posterior oropharyngeal erythema present.  Eyes:     Conjunctiva/sclera: Conjunctivae normal.     Pupils: Pupils are equal, round, and reactive to light.  Neck:     Musculoskeletal: Normal range of motion.  Cardiovascular:     Rate and Rhythm: Normal rate and regular rhythm.     Heart sounds: Normal heart sounds.  Pulmonary:     Effort: Pulmonary effort is normal. No respiratory distress.     Breath sounds: Normal breath sounds.  Abdominal:     General: There is no distension.     Palpations: Abdomen is soft.  Musculoskeletal: Normal range of motion.  Lymphadenopathy:     Cervical: Cervical adenopathy present.  Skin:    General: Skin is warm and dry.  Neurological:     Mental Status: She is alert.      UC Treatments / Results  Labs (all labs ordered are listed, but only abnormal results are displayed) Labs Reviewed - No data to display  EKG None  Radiology No results found.  Procedures Procedures (including critical care time)  Medications Ordered in UC Medications - No data to display  Initial Impression / Assessment and Plan / UC Course  I have reviewed the triage vital signs and the nursing notes.  Pertinent labs & imaging results that were available during my care of the patient were reviewed by me and considered in my medical decision making (see chart for details).     We reviewed the most sinus infections [place] viruses.  Most viruses do  not need antibiotics to get better.   Since she has worsening symptoms over a week and increasing purulence I will give her a course of antibiotics.  I have asked her to fill and use antibiotics only if she fails to improve with symptomatic care for few more days. Final Clinical Impressions(s) / UC Diagnoses   Final diagnoses:  Acute upper respiratory infection     Discharge Instructions     Make sure you are drinking plenty of fluids Use the Nasacort as directed Use Mucinex to help loosen the mucus You may use Tylenol or ibuprofen for pain and fever Fill and use the antibiotic only if you have symptoms lasting longer than a week, or have significant worsening symptoms with high fever   ED Prescriptions    Medication Sig Dispense Auth. Provider   triamcinolone (NASACORT) 55 MCG/ACT AERO nasal inhaler Place 2 sprays into the nose daily. 1 Inhaler Eustace Moore, MD   amoxicillin (AMOXIL) 875 MG tablet Take 1 tablet (875 mg total) by mouth 2 (two) times daily. 14 tablet Eustace Moore, MD     Controlled Substance Prescriptions [place] Controlled Substance Registry consulted? Not Applicable   Eustace Moore, MD 04/30/18 2035

## 2018-04-30 NOTE — ED Triage Notes (Signed)
Pt presents with sinus issues; facial/sinus pressure, nasal congestion, bloody & green mucus, and headache.

## 2018-04-30 NOTE — Discharge Instructions (Signed)
Make sure you are drinking plenty of fluids Use the Nasacort as directed Use Mucinex to help loosen the mucus You may use Tylenol or ibuprofen for pain and fever Fill and use the antibiotic only if you have symptoms lasting longer than a week, or have significant worsening symptoms with high fever

## 2018-06-10 ENCOUNTER — Encounter: Payer: Self-pay | Admitting: Obstetrics & Gynecology

## 2018-06-10 ENCOUNTER — Ambulatory Visit (INDEPENDENT_AMBULATORY_CARE_PROVIDER_SITE_OTHER): Payer: Medicaid Other | Admitting: Obstetrics & Gynecology

## 2018-06-10 ENCOUNTER — Other Ambulatory Visit (HOSPITAL_COMMUNITY)
Admission: RE | Admit: 2018-06-10 | Discharge: 2018-06-10 | Disposition: A | Discharge status: Home / Self Care | Payer: Medicaid Other | Source: Ambulatory Visit | Attending: Obstetrics & Gynecology | Admitting: Obstetrics & Gynecology

## 2018-06-10 VITALS — BP 139/85 | HR 103 | Ht 62.0 in | Wt 209.6 lb

## 2018-06-10 DIAGNOSIS — Z3202 Encounter for pregnancy test, result negative: Secondary | ICD-10-CM | POA: Diagnosis not present

## 2018-06-10 DIAGNOSIS — Z0000 Encounter for general adult medical examination without abnormal findings: Secondary | ICD-10-CM

## 2018-06-10 DIAGNOSIS — Z01419 Encounter for gynecological examination (general) (routine) without abnormal findings: Secondary | ICD-10-CM

## 2018-06-10 DIAGNOSIS — N911 Secondary amenorrhea: Secondary | ICD-10-CM

## 2018-06-10 LAB — POCT URINE PREGNANCY: Preg Test, Ur: NEGATIVE

## 2018-06-10 MED ORDER — MEDROXYPROGESTERONE ACETATE 10 MG PO TABS: 10.0000 mg | ORAL_TABLET | Freq: Every day | ORAL | 2 refills | Status: DC

## 2018-06-10 NOTE — Patient Instructions (Signed)
MyChart allows you to send messages to your doctor, view your lab results (as released by your physician), manage appointments, and more. If you have questions, you can call [phone] ([phone]) to talk to our [place] staff. Remember, MyChart is NOT to be used for urgent needs. For medical emergencies, dial 911.    Secondary Amenorrhea  Secondary amenorrhea occurs when a female who was previously having menstrual periods has not had them for 3-6 months. A menstrual period is the monthly shedding of the lining of the uterus. Menstruation involves the passing of blood, tissue, fluid, and mucus through the vagina. The flow of blood usually occurs during 3-7 consecutive days each month. This condition has many causes. In many cases, treating the underlying cause will return menstrual periods back to a normal cycle. What are the causes? The most common cause of this condition is pregnancy. Other causes include:  Malnutrition.  Cirrhosis of the liver.  Conditions of the blood.  Diabetes.  Epilepsy.  Chronic kidney disease.  Polycystic ovary disease.  Stress or anxiety.  A hormonal imbalance.  Ovarian failure.  Medicines.  Extreme obesity.  Cystic fibrosis.  Low body weight or drastic weight loss.  Early menopause.  Removal of the ovaries or uterus.  Contraceptive pills, patches, or vaginal rings.  Cushing syndrome.  Thyroid problems. What increases the risk? You are more likely to develop this condition if:  You have a family history of this condition.  You have an eating disorder.  You do extreme athletic training.  You have a chronic disease.  You abuse substances such as alcohol or cigarettes. What are the signs or symptoms? The main symptom of this condition is a lack of menstrual periods for 3-6 months. How is this diagnosed? This condition may be diagnosed based on:  Your medical history.  A physical exam.  A pelvic exam to check for problems  with your reproductive organs.  A procedure to examine the uterus.  A measurement of your body mass index (BMI).  Tests, such as: ? Blood tests that measure certain hormones in your body and rule out pregnancy. ? Urine tests. ? Imaging tests, such as an ultrasound, CT scan, or MRI. How is this treated? Treatment for this condition depends on the cause of the amenorrhea. It may involve:  Correcting dietary problems.  Treating underlying conditions.  Medicines.  Lifestyle changes.  Surgery. If the condition cannot be corrected, it is sometimes possible to trigger menstrual periods with medicines. Follow these instructions at home: Lifestyle  Maintain a healthy diet. Ask to meet with a registered dietitian for nutrition counseling and meal planning.  Maintain a healthy weight. Talk to your health care provider before trying any new diet or exercise plan.  Exercise at least 30 minutes 5 or more days each week. Exercising includes brisk walking, yard work, biking, running, swimming, and team sports like basketball and soccer. Ask your health care provider which exercises are safe for you.  Get enough sleep. Plan your sleep time to allow for 7-9 hours of sleep each night.  Learn to manage stress. Explore relaxation techniques such as meditation, journaling, yoga, or tai chi. General instructions  Be aware of changes in your menstrual cycle. Keep a record of when you have your menstrual period. Note the date your period starts, how long it lasts, and any problems you experience.  Take over-the-counter and prescription medicines only as told by your health care provider.  Keep all follow-up visits as told by your  health care provider. This is important. Contact a health care provider if:  Your periods do not return to normal after treatment. Summary  Secondary amenorrhea is when a female who was previously having menstrual periods has not gotten her period for 3-6  months.  This condition has many causes. In many cases, treating the underlying cause will return menstrual periods back to a normal cycle.  Talk to your health care provider if your periods do not return to normal after treatment. This information is not intended to replace advice given to you by your health care provider. Make sure you discuss any questions you have with your health care provider. Document Released: 05/05/2006 Document Revised: 06/12/2016 Document Reviewed: 06/12/2016 Elsevier Interactive Patient Education  2019 [place] 18-39 Years, Female Preventive care refers to lifestyle choices and visits with your health care provider that can promote health and wellness. What does preventive care include?   A yearly physical exam. This is also called an annual well check.  Dental exams once or twice a year.  Routine eye exams. Ask your health care provider how often you should have your eyes checked.  Personal lifestyle choices, including: ? Daily care of your teeth and gums. ? Regular physical activity. ? Eating a healthy diet. ? Avoiding tobacco and drug use. ? Limiting alcohol use. ? Practicing safe sex. ? Taking vitamin and mineral supplements as recommended by your health care provider. What happens during an annual well check? The services and screenings done by your health care provider during your annual well check will depend on your age, overall health, lifestyle risk factors, and family history of disease. Counseling Your health care provider may ask you questions about your:  Alcohol use.  Tobacco use.  Drug use.  Emotional well-being.  Home and relationship well-being.  Sexual activity.  Eating habits.  Work and work Statistician.  Method of birth control.  Menstrual cycle.  Pregnancy history. Screening You may have the following tests or measurements:  Height, weight, and BMI.  Diabetes screening. This is done by  checking your blood sugar (glucose) after you have not eaten for a while (fasting).  Blood pressure.  Lipid and cholesterol levels. These may be checked every 5 years starting at age 69.  Skin check.  Hepatitis C blood test.  Hepatitis B blood test.  Sexually transmitted disease (STD) testing.  BRCA-related cancer screening. This may be done if you have a family history of breast, ovarian, tubal, or peritoneal cancers.  Pelvic exam and Pap test. This may be done every 3 years starting at age 55. Starting at age 78, this may be done every 5 years if you have a Pap test in combination with an HPV test. Discuss your test results, treatment options, and if necessary, the need for more tests with your health care provider. Vaccines Your health care provider may recommend certain vaccines, such as:  Influenza vaccine. This is recommended every year.  Tetanus, diphtheria, and acellular pertussis (Tdap, Td) vaccine. You may need a Td booster every 10 years.  Varicella vaccine. You may need this if you have not been vaccinated.  HPV vaccine. If you are 66 or younger, you may need three doses over 6 months.  Measles, mumps, and rubella (MMR) vaccine. You may need at least one dose of MMR. You may also need a second dose.  Pneumococcal 13-valent conjugate (PCV13) vaccine. You may need this if you have certain conditions and were not previously vaccinated.  Pneumococcal polysaccharide (PPSV23) vaccine. You may need one or two doses if you smoke cigarettes or if you have certain conditions.  Meningococcal vaccine. One dose is recommended if you are age 33-21 years and a first-year college student living in a residence hall, or if you have one of several medical conditions. You may also need additional booster doses.  Hepatitis A vaccine. You may need this if you have certain conditions or if you travel or work in places where you may be exposed to hepatitis A.  Hepatitis B vaccine. You may  need this if you have certain conditions or if you travel or work in places where you may be exposed to hepatitis B.  Haemophilus influenzae type b (Hib) vaccine. You may need this if you have certain risk factors. Talk to your health care provider about which screenings and vaccines you need and how often you need them. This information is not intended to replace advice given to you by your health care provider. Make sure you discuss any questions you have with your health care provider. Document Released: 05/20/2001 Document Revised: 11/04/2016 Document Reviewed: 01/23/2015 Elsevier Interactive Patient Education  2019 [employer].

## 2018-06-10 NOTE — Progress Notes (Signed)
GYNECOLOGY ANNUAL PREVENTATIVE CARE ENCOUNTER NOTE  History:     [name] is a 33 y.o. G64P2002 female here for a routine annual gynecologic exam.  Current complaints:amenorrhea x 2 months.  Reports recent weight gain, stress over rent loss of grandfather and is going through a divorce. Other social stressors reported.   Denies abnormal vaginal bleeding, discharge, pelvic pain, problems with intercourse or other gynecologic concerns.    Gynecologic History Patient's last menstrual period was 03/22/2018 (within days). Contraception: none Last Pap: 2014. Results were: normal  Obstetric History OB History  Gravida Para Term Preterm AB Living  2 2 2     2   SAB TAB Ectopic Multiple Live Births          2    # Outcome Date GA Lbr Len/2nd Weight Sex Delivery Anes PTL Lv  2 Term 12/05/12 [redacted]w[redacted]d 04:35 / 00:06 8 lb 2 oz (3.685 kg) M Vag-Spont None  LIV     Birth Comments: none  1 Term 06/05/06 [redacted]w[redacted]d  7 lb 1 oz (3.204 kg) M Vag-Spont None  LIV    Past Medical History:  Diagnosis Date  . Depression   . GERD (gastroesophageal reflux disease)   . Hx of percutaneous transcatheter closure of congenital VSD   . Migraine headache with aura   . Panic attack     Past Surgical History:  Procedure Laterality Date  . CARDIAC SURGERY     as a baby  . TONSILLECTOMY      Current Outpatient Medications on File Prior to Visit  Medication Sig Dispense Refill  . amoxicillin (AMOXIL) 875 MG tablet Take 1 tablet (875 mg total) by mouth 2 (two) times daily. (Patient not taking: Reported on 06/10/2018) 14 tablet 0  . triamcinolone (NASACORT) 55 MCG/ACT AERO nasal inhaler Place 2 sprays into the nose daily. (Patient not taking: Reported on 06/10/2018) 1 Inhaler 0   No current facility-administered medications on file prior to visit.     No Known Allergies  Social History:  reports that she quit smoking about 7 years ago. She has never used smokeless tobacco. She reports current alcohol use. She  reports that she does not use drugs.  Family History  Problem Relation Age of Onset  . Congestive Heart Failure Other   . Asthma Paternal Aunt   . Congestive Heart Failure Paternal Uncle   . Cancer Paternal Grandmother        breast  . Stroke Paternal Grandfather   . Asthma Cousin   . Hypertension Father   . Diabetes Father     The following portions of the patient's history were reviewed and updated as appropriate: allergies, current medications, past family history, past medical history, past social history, past surgical history and problem list.  Review of Systems Pertinent items noted in HPI and remainder of comprehensive ROS otherwise negative.  Physical Exam:  BP 139/85   Pulse (!) 103   Ht 5\' 2"  (1.575 m)   Wt 209 lb 9.6 oz (95.1 kg)   LMP 03/22/2018 (Within Days)   BMI 38.34 kg/m  CONSTITUTIONAL: Well-developed, well-nourished female in no acute distress.  HENT:  Normocephalic, atraumatic, External right and left ear normal. Oropharynx is clear and moist EYES: Conjunctivae and EOM are normal. Pupils are equal, round, and reactive to light. No scleral icterus.  NECK: Normal range of motion, supple, no masses.  Normal thyroid.  SKIN: Skin is warm and dry. No rash noted. Not diaphoretic. No erythema. No  pallor. MUSCULOSKELETAL: Normal range of motion. No tenderness.  No cyanosis, clubbing, or edema.  2+ distal pulses. NEUROLOGIC: Alert and oriented to person, place, and time. Normal reflexes, muscle tone coordination. No cranial nerve deficit noted. PSYCHIATRIC: Normal mood and affect. Normal behavior. Normal judgment and thought content. CARDIOVASCULAR: Normal heart rate noted, regular rhythm RESPIRATORY: Clear to auscultation bilaterally. Effort and breath sounds normal, no problems with respiration noted. BREASTS: Symmetric in size. No masses, skin changes, nipple drainage, or lymphadenopathy. ABDOMEN: Soft, normal bowel sounds, no distention noted.  No tenderness,  rebound or guarding.  PELVIC: Normal appearing external genitalia; normal appearing vaginal mucosa and cervix.  No abnormal discharge noted.  Pap smear obtained.  Normal uterine size, no other palpable masses, no uterine or adnexal tenderness.  Results for orders placed or performed in visit on 06/10/18 (from the past 24 hour(s))  POCT urine pregnancy     Status: None   Collection Time: 06/10/18 11:20 AM  Result Value Ref Range   Preg Test, Ur Negative Negative      Assessment and Plan:    1. Secondary amenorrhea Likely secondary to multiple stressors/weight changes. Will evaluate labs to rule out other etiologies. Provera challenge to be done. Encouraged weight loss, stress reduction. Patient said she will attempt conception later in the year, told to take MVI with folic acid, avoid toxic substances.  Also informed her of ovulation predictor kits that may help.   - POCT urine pregnancy - TSH+Prl+TestT+TestF+17OHP - Beta hCG quant (ref lab) - medroxyPROGESTERone (PROVERA) 10 MG tablet; Take 1 tablet (10 mg total) by mouth daily. Use for ten days  Dispense: 10 tablet; Refill: 2 Estradiol 2 mg po daily for 21 days; then Provera 10 mg daily for 7 days*  *Provera 10 mg daily x 10 days and see if she gets her period usually within 7 days.  If not, she will have to do an estrogen-progestin challenge which is Estradiol 2 mg daily x 21 days followed by Provera 10 mg daily x 7 days to see if this results in a period.   2. Well woman exam with routine gynecological exam - Cytology - PAP Will follow up results of pap smear and manage accordingly. Routine preventative health maintenance measures emphasized. Please refer to After Visit Summary for other counseling recommendations.      Jaynie Collins, MD, FACOG Obstetrician & Gynecologist, [facility] for [employer], [facility]

## 2018-06-11 LAB — CYTOLOGY - PAP
Diagnosis: NEGATIVE
HPV: NOT DETECTED

## 2018-06-14 LAB — TSH+PRL+TESTT+TESTF+17OHP
17-Hydroxyprogesterone: 81 ng/dL
Prolactin: 11.1 ng/mL (ref 4.8–23.3)
TSH: 2.26 u[IU]/mL (ref 0.450–4.500)
Testosterone, Free: 6.3 pg/mL — ABNORMAL HIGH (ref 0.0–4.2)
Testosterone, Total, LC/MS: 68.3 ng/dL — ABNORMAL HIGH (ref 10.0–55.0)

## 2018-06-14 LAB — BETA HCG QUANT (REF LAB): hCG Quant: <1 m[IU]/mL

## 2018-06-16 ENCOUNTER — Ambulatory Visit: Payer: Medicaid Other | Admitting: Obstetrics & Gynecology

## 2018-06-23 ENCOUNTER — Encounter: Payer: Self-pay | Admitting: Obstetrics and Gynecology

## 2018-06-23 ENCOUNTER — Other Ambulatory Visit: Payer: Self-pay

## 2018-06-23 ENCOUNTER — Ambulatory Visit: Payer: Medicaid Other | Admitting: Obstetrics and Gynecology

## 2018-06-23 VITALS — BP 129/81 | HR 96 | Wt 207.5 lb

## 2018-06-23 DIAGNOSIS — E282 Polycystic ovarian syndrome: Secondary | ICD-10-CM

## 2018-06-23 NOTE — Progress Notes (Signed)
33 yo P2 here to discuss test results. Patient was seen on 3/5 for the evaluation of amenorrhea. Patient reports onset of vaginal bleeding following progesterone challenge. She is doing well and is without complaints. She is interested in conceiving in the next few months  Past Medical History:  Diagnosis Date  . Depression   . GERD (gastroesophageal reflux disease)   . Hx of percutaneous transcatheter closure of congenital VSD   . Migraine headache with aura   . Panic attack    Past Surgical History:  Procedure Laterality Date  . CARDIAC SURGERY     as a baby  . TONSILLECTOMY     Family History  Problem Relation Age of Onset  . Congestive Heart Failure Other   . Asthma Paternal Aunt   . Congestive Heart Failure Paternal Uncle   . Cancer Paternal Grandmother        breast  . Stroke Paternal Grandfather   . Asthma Cousin   . Hypertension Father   . Diabetes Father    Social History   Tobacco Use  . Smoking status: Former Smoker    Last attempt to quit: 08/16/2010    Years since quitting: 7.8  . Smokeless tobacco: Never Used  Substance Use Topics  . Alcohol use: Yes    Comment: socially  . Drug use: No   ROS See pertinent in HPI  Blood pressure 129/81, pulse 96, weight 207 lb 8 oz (94.1 kg), last menstrual period 06/21/2018. GENERAL: Well-developed, well-nourished female in no acute distress.  NEURO: alert and oriented x3  A/P 33 yo with PCOS - lab results reviewed with the patient suggestive of PCOS - Information on diagnosis provided and PCOS diet provided - Discussed timing of intercourse with the use of ovulation predictor kits - Patient conceived her last 2 sons without difficulty - Discussed ovulation stimulation with clomid or Femara. - Advised patient to take prenatal vitamins - patient may call or return when ready for Femara/clomid. She plans to discuss this with her husband. They plan on starting to conceive in the next few months - RTC prn

## 2018-06-23 NOTE — Progress Notes (Signed)
Pt is here to discuss blood work results and new diagnosis of PCOS.

## 2018-06-23 NOTE — Patient Instructions (Signed)
Polycystic Ovarian Syndrome    Polycystic ovarian syndrome (PCOS) is a common hormonal disorder among women of reproductive age. In most women with PCOS, many small fluid-filled sacs (cysts) grow on the ovaries, and the cysts are not part of a normal menstrual cycle. PCOS can cause problems with your menstrual periods and make it difficult to get pregnant. It can also cause an increased risk of miscarriage with pregnancy. If it is not treated, PCOS can lead to serious health problems, such as diabetes and heart disease.  What are the causes?  The cause of PCOS is not known, but it may be the result of a combination of certain factors, such as:  · Irregular menstrual cycle.  · High levels of certain hormones (androgens).  · Problems with the hormone that helps to control blood sugar (insulin resistance).  · Certain genes.  What increases the risk?  This condition is more likely to develop in women who have a family history of PCOS.  What are the signs or symptoms?  Symptoms of PCOS may include:  · Multiple ovarian cysts.  · Infrequent periods or no periods.  · Periods that are too frequent or too heavy.  · Unpredictable periods.  · Inability to get pregnant (infertility) because of not ovulating.  · Increased growth of hair on the face, chest, stomach, back, thumbs, thighs, or toes.  · Acne or oily skin. Acne may develop during adulthood, and it may not respond to treatment.  · Pelvic pain.  · Weight gain or obesity.  · Patches of thickened and dark brown or black skin on the neck, arms, breasts, or thighs (acanthosis nigricans).  · Excess hair growth on the face, chest, abdomen, or upper thighs (hirsutism).  How is this diagnosed?  This condition is diagnosed based on:  · Your medical history.  · A physical exam, including a pelvic exam. Your health care provider may look for areas of increased hair growth on your skin.  · Tests, such as:  ? Ultrasound. This may be used to examine the ovaries and the lining of the  uterus (endometrium) for cysts.  ? Blood tests. These may be used to check levels of sugar (glucose), female hormone (testosterone), and female hormones (estrogen and progesterone) in your blood.  How is this treated?  There is no cure for PCOS, but treatment can help to manage symptoms and prevent more health problems from developing. Treatment varies depending on:  · Your symptoms.  · Whether you want to have a baby or whether you need birth control (contraception).  Treatment may include nutrition and lifestyle changes along with:  · Progesterone hormone to start a menstrual period.  · Birth control pills to help you have regular menstrual periods.  · Medicines to make you ovulate, if you want to get pregnant.  · Medicine to reduce excessive hair growth.  · Surgery, in severe cases. This may involve making small holes in one or both of your ovaries. This decreases the amount of testosterone that your body produces.  Follow these instructions at home:  · Take over-the-counter and prescription medicines only as told by your health care provider.  · Follow a healthy meal plan. This can help you reduce the effects of PCOS.  ? Eat a healthy diet that includes lean proteins, complex carbohydrates, fresh fruits and vegetables, low-fat dairy products, and healthy fats. Make sure to eat enough fiber.  · If you are overweight, lose weight as told by your health care   provider.  ? Losing 10% of your body weight may improve symptoms.  ? Your health care provider can determine how much weight loss is best for you and can help you lose weight safely.  · Keep all follow-up visits as told by your health care provider. This is important.  Contact a health care provider if:  · Your symptoms do not get better with medicine.  · You develop new symptoms.  This information is not intended to replace advice given to you by your health care provider. Make sure you discuss any questions you have with your health care provider.  Document  Released: 07/18/2004 Document Revised: 11/20/2015 Document Reviewed: 09/09/2015  Elsevier Interactive Patient Education © 2019 [employer]          Diet for Polycystic Ovary Syndrome  Polycystic ovary syndrome (PCOS) is a disorder of the chemicals (hormones) that regulate a woman's reproductive system, including monthly periods (menstruation). The condition causes important hormones to be out of balance. PCOS can:  · Stop your periods or make them irregular.  · Cause cysts to develop on your ovaries.  · Make it difficult to get pregnant.  · Stop your body from responding to the effects of insulin (insulin resistance). Insulin resistance can lead to obesity and diabetes.  Changing what you eat can help you manage PCOS and improve your health. Following a balanced diet can help you lose weight and improve the way that your body uses insulin.  What are tips for following this plan?  · Follow a balanced diet for meals and snacks. Eat breakfast, lunch, dinner, and one or two snacks every day.  · Include protein in each meal and snack.  · Choose whole grains instead of products that are made with refined flour.  · Eat a variety of foods.  · Exercise regularly as told by your health care provider. Aim to do 30 or more minutes of exercise on most days of the week.  · If you are overweight or obese:  ? Pay attention to how many calories you eat. Cutting down on calories can help you lose weight.  ? Work with your health care provider or a diet and nutrition specialist (dietitian) to figure out how many calories you need each day.  What foods can I eat?    Fruits  Include a variety of colors and types. All fruits are helpful for PCOS.  Vegetables  Include a variety of colors and types. All vegetables are helpful for PCOS.  Grains  Whole grains, such as whole wheat. Whole-grain breads, crackers, cereals, and pasta. Unsweetened oatmeal, bulgur, barley, quinoa, and brown rice. Tortillas made from corn or whole-wheat  flour.  Meats and other proteins  Low-fat (lean) proteins, such as fish, chicken, beans, eggs, and tofu.  Dairy  Low-fat dairy products, such as skim milk, cheese sticks, and yogurt.  Beverages  Low-fat or fat-free drinks, such as water, low-fat milk, sugar-free drinks, and small amounts of 100% fruit juice.  Seasonings and condiments  Ketchup. Mustard. Barbecue sauce. Relish. Low-fat or fat-free mayonnaise.  Fats and oils  Olive oil or canola oil. Walnuts and almonds.  The items listed above may not be a complete list of recommended foods and beverages. Contact a dietitian for more options.  What foods are not recommended?  Foods that are high in calories or fat. Fried foods. Sweets. Products that are made from refined white flour, including white bread, pastries, white rice, and pasta.  The items listed above   may not be a complete list of foods and beverages to avoid. Contact a dietitian for more information.  Summary  · PCOS is a hormonal imbalance that affects a woman's reproductive system.  · You can help to manage your PCOS by exercising regularly and eating a healthy, varied diet of vegetables, fruit, whole grains, low-fat (lean) protein, and low-fat dairy products.  · Changing what you eat can improve the way that your body uses insulin, help your hormones reach normal levels, and help you lose weight.  This information is not intended to replace advice given to you by your health care provider. Make sure you discuss any questions you have with your health care provider.  Document Released: 07/16/2015 Document Revised: 01/26/2017 Document Reviewed: 01/26/2017  Elsevier Interactive Patient Education © 2019 [employer] Inc.

## 2018-09-07 ENCOUNTER — Other Ambulatory Visit: Payer: Self-pay

## 2018-09-07 ENCOUNTER — Encounter (HOSPITAL_COMMUNITY): Payer: Self-pay

## 2018-09-07 ENCOUNTER — Ambulatory Visit (HOSPITAL_COMMUNITY)
Admission: EM | Admit: 2018-09-07 | Discharge: 2018-09-07 | Disposition: A | Discharge status: Home / Self Care | Payer: Medicaid Other | Attending: Family Medicine | Admitting: Family Medicine

## 2018-09-07 DIAGNOSIS — M25562 Pain in left knee: Secondary | ICD-10-CM | POA: Diagnosis not present

## 2018-09-07 MED ORDER — DICLOFENAC SODIUM 75 MG PO TBEC: 75.0000 mg | DELAYED_RELEASE_TABLET | Freq: Two times a day (BID) | ORAL | 0 refills | Status: DC

## 2018-09-07 NOTE — ED Triage Notes (Signed)
Patient presents to Urgent Care with complaints of left knee pain, worse posteriorly since about a month ago. Patient reports her pain is worse at night, got significantly worse over the weekend.

## 2018-09-08 NOTE — ED Provider Notes (Signed)
[facility]   [id number] 09/07/18 Arrival Time: 1656  ASSESSMENT & PLAN:  1. Acute pain of left knee    No indication for plain imaging of knee today. Discussed.  Meds ordered this encounter  Medications  . diclofenac (VOLTAREN) 75 MG EC tablet    Sig: Take 1 tablet (75 mg total) by mouth 2 (two) times daily.    Dispense:  14 tablet    Refill:  0   Will try: Orders Placed This Encounter  Procedures  . Apply knee sleeve   Recommend: Follow-up Information    [facility].   Why:  If not improving over the next week. Contact information: [address] [place] [address] [phone]          Reviewed expectations re: course of current medical issues. Questions answered. Outlined signs and symptoms indicating need for more acute intervention. Patient verbalized understanding. After Visit Summary given.  SUBJECTIVE: History from: patient. [name] is a 33 y.o. female who reports intermittent moderate pain of her left knee, more posteriorly; described as aching without radiation. Onset: gradual, over the past month. Injury/trama: no. Symptoms have waxed and waned but are worse overall since beginning. Aggravating factors: certain movements. Alleviating factors: rest. Associated symptoms: none reported. Extremity sensation changes or weakness: none. Self treatment: tried OTCs with some relief of pain. History of similar: no.  Past Surgical History:  Procedure Laterality Date  . CARDIAC SURGERY     as a baby  . TONSILLECTOMY       ROS: As per HPI. All other systems negative.   OBJECTIVE:  Vitals:   09/07/18 1716  BP: 123/72  Pulse: 88  Resp: 18  Temp: 98.3 F (36.8 C)  TempSrc: Oral  SpO2: 100%    General appearance: alert; no distress HEENT: [place]; AT Neck: supple with FROM Resp: unlabored respirations Extremities: . LLE: warm and well perfused; poorly localized mild to moderate  tenderness over left knee; more lateral/posterior; without gross deformities; with no swelling; with no bruising; ROM: normal without reported discomfort CV: brisk extremity capillary refill of LLE Skin: warm and dry; no visible rashes Neurologic: gait normal; normal reflexes of RLE and LLE; normal sensation of RLE and LLE; normal strength of RLE and LLE Psychological: alert and cooperative; normal mood and affect  No Known Allergies  Past Medical History:  Diagnosis Date  . Depression   . GERD (gastroesophageal reflux disease)   . Hx of percutaneous transcatheter closure of congenital VSD   . Migraine headache with aura   . Panic attack    Social History   Socioeconomic History  . Marital status: Legally Separated    Spouse name: Not on file  . Number of children: Not on file  . Years of education: Not on file  . Highest education level: Not on file  Occupational History  . Not on file  Social Needs  . Financial resource strain: Not on file  . Food insecurity:    Worry: Not on file    Inability: Not on file  . Transportation needs:    Medical: Not on file    Non-medical: Not on file  Tobacco Use  . Smoking status: Former Smoker    Last attempt to quit: 08/16/2010    Years since quitting: 8.0  . Smokeless tobacco: Never Used  Substance and Sexual Activity  . Alcohol use: Yes    Comment: socially  . Drug use: No  .  Sexual activity: Yes    Birth control/protection: None  Lifestyle  . Physical activity:    Days per week: Not on file    Minutes per session: Not on file  . Stress: Not on file  Relationships  . Social connections:    Talks on phone: Not on file    Gets together: Not on file    Attends religious service: Not on file    Active member of club or organization: Not on file    Attends meetings of clubs or organizations: Not on file    Relationship status: Not on file  Other Topics Concern  . Not on file  Social History Narrative  . Not on file    Family History  Problem Relation Age of Onset  . Congestive Heart Failure Other   . Asthma Paternal Aunt   . Congestive Heart Failure Paternal Uncle   . Cancer Paternal Grandmother        breast  . Stroke Paternal Grandfather   . Asthma Cousin   . Healthy Mother   . Hypertension Father   . Diabetes Father    Past Surgical History:  Procedure Laterality Date  . CARDIAC SURGERY     as a baby  . Greig RightNSILLECTOMY        Christyna Letendre, MD 09/08/18 [id number]

## 2018-10-05 ENCOUNTER — Encounter (HOSPITAL_COMMUNITY): Payer: Self-pay

## 2018-10-05 ENCOUNTER — Ambulatory Visit (HOSPITAL_COMMUNITY)
Admission: EM | Admit: 2018-10-05 | Discharge: 2018-10-05 | Disposition: A | Discharge status: Home / Self Care | Payer: Medicaid Other | Attending: Emergency Medicine | Admitting: Emergency Medicine

## 2018-10-05 ENCOUNTER — Other Ambulatory Visit: Payer: Self-pay

## 2018-10-05 DIAGNOSIS — R110 Nausea: Secondary | ICD-10-CM | POA: Diagnosis not present

## 2018-10-05 DIAGNOSIS — Z3202 Encounter for pregnancy test, result negative: Secondary | ICD-10-CM | POA: Diagnosis not present

## 2018-10-05 DIAGNOSIS — S96911A Strain of unspecified muscle and tendon at ankle and foot level, right foot, initial encounter: Secondary | ICD-10-CM

## 2018-10-05 LAB — POCT PREGNANCY, URINE: Preg Test, Ur: NEGATIVE

## 2018-10-05 MED ORDER — KETOROLAC TROMETHAMINE 30 MG/ML IJ SOLN
INTRAMUSCULAR | Status: AC
  Filled 2018-10-05: qty 1

## 2018-10-05 MED ORDER — KETOROLAC TROMETHAMINE 30 MG/ML IJ SOLN
30.0000 mg | Freq: Once | INTRAMUSCULAR | Status: AC
  Administered 2018-10-05: 30 mg via INTRAMUSCULAR

## 2018-10-05 NOTE — ED Provider Notes (Signed)
[place]    CSN: [id number] Arrival date & time: 10/05/18  3716     History   Chief Complaint Chief Complaint  Patient presents with  . Appointment    9:10  . Knee Pain    HPI [name] is a 33 y.o. female presenting for acute concern of right leg pain.  Patient states that she was away at the beach this past week, and developed a throbbing, aching sensation distal to her knee Sunday night.  Patient states that this is nearly constant, has had intermittent numbness in some of her toes.  Patient states that her symptoms are alleviated with elevation and rest, worse with prolonged standing and use.  Patient states that she has a prolonged history of repeated right ankle injuries: Sprain/strains throughout childhood and young adulthood.  Patient denies previous fracture of her foot or ankle.  Patient denies knee pain or ipsilateral leg pain.  Patient denies history of malignancy, coagulopathy, blood clot.  Denies chest pain/discomfort, cough, shortness of breath, hemoptysis.  Has tried ibuprofen with mild relief of symptoms.  Patient is able to ambulate, though reports tightness in her ankle. Of note, patient states that she might be pregnant.  Has had couple episodes of nausea over the last week.  Currently sexually active not using condoms.  LMP over a month ago.  Denies abdominal, pelvic, vaginal pain or discharge.   Past Medical History:  Diagnosis Date  . Depression   . GERD (gastroesophageal reflux disease)   . Hx of percutaneous transcatheter closure of congenital VSD   . Migraine headache with aura   . Panic attack     Patient Active Problem List   Diagnosis Date Noted  . Migraine headache with aura   . GERD 12/01/2006    Past Surgical History:  Procedure Laterality Date  . CARDIAC SURGERY     as a baby  . TONSILLECTOMY      OB History    Gravida  2   Para  2   Term  2   Preterm      AB      Living  2     SAB      TAB      Ectopic      Multiple      Live Births  2            Home Medications    Prior to Admission medications   Medication Sig Start Date End Date Taking? Authorizing Provider  diclofenac (VOLTAREN) 75 MG EC tablet Take 1 tablet (75 mg total) by mouth 2 (two) times daily. 09/07/18   Vanessa Kick, MD    Family History Family History  Problem Relation Age of Onset  . Congestive Heart Failure Other   . Asthma Paternal Aunt   . Congestive Heart Failure Paternal Uncle   . Cancer Paternal Grandmother        breast  . Stroke Paternal Grandfather   . Asthma Cousin   . Healthy Mother   . Hypertension Father   . Diabetes Father     Social History Social History   Tobacco Use  . Smoking status: Former Smoker    Quit date: 08/16/2010    Years since quitting: 8.1  . Smokeless tobacco: Never Used  Substance Use Topics  . Alcohol use: Yes    Comment: socially  . Drug use: No     Allergies   Patient has no known allergies.   Review  of Systems As per HPI   Physical Exam Triage Vital Signs ED Triage Vitals  Enc Vitals Group     BP      Pulse      Resp      Temp      Temp src      SpO2      Weight      Height      Head Circumference      Peak Flow      Pain Score      Pain Loc      Pain Edu?      Excl. in GC?    No data found.  Updated Vital Signs BP 105/75 (BP Location: Right Arm)   Pulse 96   Temp 98.1 F (36.7 C) (Oral)   Resp 16   SpO2 96%   Visual Acuity Right Eye Distance:   Left Eye Distance:   Bilateral Distance:    Right Eye Near:   Left Eye Near:    Bilateral Near:     Physical Exam Constitutional:      General: She is not in acute distress. HENT:     Head: Normocephalic and atraumatic.  Eyes:     General: No scleral icterus.    Pupils: Pupils are equal, round, and reactive to light.  Cardiovascular:     Rate and Rhythm: Normal rate.  Pulmonary:     Effort: Pulmonary effort is normal.  Musculoskeletal:     Comments: Right leg  mildly more edematous as compared to left with right ankle swelling.  No rash, ecchymosis, open wound.  Full active range of motion of knee and ankle and right leg as compared to left with 5/5 strength that is symmetric bilaterally.  NVI.  Negative Homans sign no cords palpated though patient is diffusely tender which is worse over sunburn areas  Skin:    Coloration: Skin is not jaundiced or pale.     Comments: Patient has diffuse sunburn without blistering on bilateral legs.  States this is tender to palpation.  Neurological:     Mental Status: She is alert and oriented to person, place, and time.      UC Treatments / Results  Labs (all labs ordered are listed, but only abnormal results are displayed) Labs Reviewed  POC URINE PREG, ED  POCT PREGNANCY, URINE    EKG None  Radiology No results found.  Procedures Procedures (including critical care time)  Medications Ordered in UC Medications  ketorolac (TORADOL) 30 MG/ML injection 30 mg (has no administration in time range)    Initial Impression / Assessment and Plan / UC Course  I have reviewed the triage vital signs and the nursing notes.  Pertinent labs & imaging results that were available during my care of the patient were reviewed by me and considered in my medical decision making (see chart for details).     33 year old female with history of right leg pain and ankle swelling.  History and physical highly suggestive of overuse injury second.  Patient also reporting possible pregnancy.  In office urine pregnancy test performed: negative.  Patient given IM Toradol in office which he tolerated well.  Will treat conservatively with rest, ice, compression, elevation.  Patient given right ASO brace to help provide stabilization and support.  Discussed the patient can also use this prior to prolonged activity given her history of repeated ankle sprains/strains.  Return precautions discussed, patient verbalized understanding and  is agreeable to plan. Final  Clinical Impressions(s) / UC Diagnoses   Final diagnoses:  Strain of right ankle, initial encounter     Discharge Instructions     Rest, ice, compress, elevate. May take aleve 2 times daily for additional relief. Wear ASO at work and when needing to stand/walk for prolonged periods.    ED Prescriptions    None     Controlled Substance Prescriptions [place] Controlled Substance Registry consulted? Not Applicable   Shea EvansHall-Potvin, Brittany, [place]-C 10/05/18 1034

## 2018-10-05 NOTE — ED Triage Notes (Signed)
Patient presents to Urgent Care with complaints of right knee pain since several weeks ago. Patient reports it got worse at the beach this past weekend, throbbing from knee down to foot. Pt endorses some swelling, is also sunburned from the beach.

## 2018-10-05 NOTE — Discharge Instructions (Addendum)
Rest, ice, compress, elevate. May take aleve 2 times daily for additional relief. Wear ASO at work and when needing to stand/walk for prolonged periods.

## 2018-10-07 ENCOUNTER — Encounter (HOSPITAL_COMMUNITY): Payer: Self-pay | Admitting: *Deleted

## 2018-10-07 ENCOUNTER — Ambulatory Visit (HOSPITAL_COMMUNITY)
Admission: EM | Admit: 2018-10-07 | Discharge: 2018-10-07 | Disposition: A | Discharge status: Home / Self Care | Payer: Medicaid Other | Attending: Internal Medicine | Admitting: Internal Medicine

## 2018-10-07 ENCOUNTER — Other Ambulatory Visit: Payer: Self-pay

## 2018-10-07 DIAGNOSIS — L551 Sunburn of second degree: Secondary | ICD-10-CM

## 2018-10-07 MED ORDER — FLUOCINOLONE ACETONIDE 0.01 % EX CREA: TOPICAL_CREAM | Freq: Two times a day (BID) | CUTANEOUS | 0 refills | Status: DC

## 2018-10-07 NOTE — ED Provider Notes (Signed)
[place]    CSN: [id number] Arrival date & time: 10/07/18  1911     History   Chief Complaint Chief Complaint  Patient presents with  . Sunburn  . Blister    HPI [name] is a 33 y.o. female with a history of gastroesophageal reflux disease comes to urgent care with complaints of blistering over the right leg and forearm.  Patient was at the beach by week ago.  At that time she used SPF 35 sunscreen.  She was out on the beach hoping to get some tan.  After getting home she has been using body lotion with some success.  She is experiencing some desquamation of the skin of the right upper arm.  A day or so ago she noticed blistering over the left forearm as well as the right lower leg.  The right leg had some erythema.  The lotions she has been using has not been helping.  She comes to urgent care for evaluation.  No fever or chills.  No nausea or vomiting.   HPI  Past Medical History:  Diagnosis Date  . Depression   . GERD (gastroesophageal reflux disease)   . Hx of percutaneous transcatheter closure of congenital VSD   . Migraine headache with aura   . Panic attack     Patient Active Problem List   Diagnosis Date Noted  . Migraine headache with aura   . GERD 12/01/2006    Past Surgical History:  Procedure Laterality Date  . CARDIAC SURGERY     as a baby  . TONSILLECTOMY      OB History    Gravida  2   Para  2   Term  2   Preterm      AB      Living  2     SAB      TAB      Ectopic      Multiple      Live Births  2            Home Medications    Prior to Admission medications   Medication Sig Start Date End Date Taking? Authorizing Provider  fluocinolone (VANOS) 0.01 % cream Apply topically 2 (two) times daily. 10/07/18   LampteyMyrene Galas, MD    Family History Family History  Problem Relation Age of Onset  . Congestive Heart Failure Other   . Asthma Paternal Aunt   . Congestive Heart Failure Paternal Uncle   .  Cancer Paternal Grandmother        breast  . Stroke Paternal Grandfather   . Asthma Cousin   . Healthy Mother   . Hypertension Father   . Diabetes Father     Social History Social History   Tobacco Use  . Smoking status: Former Smoker    Quit date: 08/16/2010    Years since quitting: 8.1  . Smokeless tobacco: Never Used  Substance Use Topics  . Alcohol use: Yes    Comment: rare  . Drug use: No     Allergies   Patient has no known allergies.   Review of Systems Review of Systems  Constitutional: Negative.   HENT: Negative.   Respiratory: Negative.   Gastrointestinal: Negative for abdominal distention, abdominal pain, diarrhea, nausea and vomiting.  Genitourinary: Negative for dysuria, frequency and urgency.  Musculoskeletal: Negative.   Skin: Positive for rash. Negative for pallor and wound.  Neurological: Negative.      Physical Exam  Triage Vital Signs ED Triage Vitals [10/07/18 2008]  Enc Vitals Group     BP 117/85     Pulse Rate 96     Resp 16     Temp 98.1 F (36.7 C)     Temp Source Oral     SpO2 96 %     Weight      Height      Head Circumference      Peak Flow      Pain Score 7     Pain Loc      Pain Edu?      Excl. in GC?    No data found.  Updated Vital Signs BP 117/85   Pulse 96   Temp 98.1 F (36.7 C) (Oral)   Resp 16   LMP 09/14/2018   SpO2 96%   Visual Acuity Right Eye Distance:   Left Eye Distance:   Bilateral Distance:    Right Eye Near:   Left Eye Near:    Bilateral Near:     Physical Exam Constitutional:      General: She is not in acute distress.    Appearance: She is not ill-appearing or toxic-appearing.  Cardiovascular:     Rate and Rhythm: Normal rate and regular rhythm.     Pulses: Normal pulses.     Heart sounds: Normal heart sounds.  Pulmonary:     Effort: Pulmonary effort is normal. No respiratory distress.     Breath sounds: Normal breath sounds. No stridor. No wheezing or rhonchi.  Abdominal:      General: Bowel sounds are normal. There is no distension.     Palpations: Abdomen is soft.     Tenderness: There is no abdominal tenderness.  Musculoskeletal: Normal range of motion.        General: No swelling or deformity.  Skin:    Capillary Refill: Capillary refill takes less than 2 seconds.     Coloration: Skin is not pale.     Findings: Erythema, lesion and rash present. No bruising.  Neurological:     General: No focal deficit present.     Mental Status: She is alert and oriented to person, place, and time.      UC Treatments / Results  Labs (all labs ordered are listed, but only abnormal results are displayed) Labs Reviewed - No data to display  EKG   Radiology No results found.  Procedures Procedures (including critical care time)  Medications Ordered in UC Medications - No data to display  Initial Impression / Assessment and Plan / UC Course  I have reviewed the triage vital signs and the nursing notes.  Pertinent labs & imaging results that were available during my care of the patient were reviewed by me and considered in my medical decision making (see chart for details).     1.  Sunburn with blistering: Fluocinolone 0.01% cream to be applied twice daily Aloe vera lotion No signs of infection at this time Patient is advised to return to urgent care if she experiences fever, chills, increasing redness or foul-smelling discharge from the blisters. Final Clinical Impressions(s) / UC Diagnoses   Final diagnoses:  Sunburn, blistering   Discharge Instructions   None    ED Prescriptions    Medication Sig Dispense Auth. Provider   fluocinolone (VANOS) 0.01 % cream Apply topically 2 (two) times daily. 30 g Merrilee JanskyLamptey, Signa Cheek O, MD     Controlled Substance Prescriptions [place] Controlled Substance Registry consulted? No  Merrilee JanskyLamptey, Benjermin Korber O, MD 10/11/18 1410

## 2018-10-07 NOTE — ED Triage Notes (Signed)
Reports being at beach approx 1 wk ago, sustaining sunburn.  States most areas of sunburn are healing/peeling, but right lower leg continues to be swollen, red.  Today started with blisters to right lower leg.  Denies fevers.

## 2018-10-14 ENCOUNTER — Telehealth: Payer: Medicaid Other | Admitting: Family

## 2018-10-14 DIAGNOSIS — R21 Rash and other nonspecific skin eruption: Secondary | ICD-10-CM | POA: Diagnosis not present

## 2018-10-14 MED ORDER — TRIAMCINOLONE ACETONIDE 0.1 % EX CREA: 1.0000 "application " | TOPICAL_CREAM | Freq: Two times a day (BID) | CUTANEOUS | 0 refills | Status: DC

## 2018-10-14 NOTE — Progress Notes (Signed)
Greater than 5 minutes, yet less than 10 minutes of time have been spent researching, coordinating, and implementing care for this patient today.  Thank you for the details you included in the comment boxes. Those details are very helpful in determining the best course of treatment for you and help [place] to provide the best care.  E Visit for Rash  We are sorry that you are not feeling well. Here is how we plan to help!   I am sending kenalog cream which should help with the rash; apply it twice daily.     HOME CARE:   Take cool showers and avoid direct sunlight.  Apply cool compress or wet dressings.  Take a bath in an oatmeal bath.  Sprinkle content of one Aveeno packet under running faucet with comfortably warm water.  Bathe for 15-20 minutes, 1-2 times daily.  Pat dry with a towel. Do not rub the rash.  Use hydrocortisone cream.  Take an antihistamine like Benadryl for widespread rashes that itch.  The adult dose of Benadryl is 25-50 mg by mouth 4 times daily.  Caution:  This type of medication may cause sleepiness.  Do not drink alcohol, drive, or operate dangerous machinery while taking antihistamines.  Do not take these medications if you have prostate enlargement.  Read package instructions thoroughly on all medications that you take.  GET HELP RIGHT AWAY IF:   Symptoms don't go away after treatment.  Severe itching that persists.  If you rash spreads or swells.  If you rash begins to smell.  If it blisters and opens or develops a yellow-brown crust.  You develop a fever.  You have a sore throat.  You become short of breath.  MAKE SURE YOU:  Understand these instructions. Will watch your condition. Will get help right away if you are not doing well or get worse.  Thank you for choosing an e-visit. Your e-visit answers were reviewed by a board certified advanced clinical practitioner to complete your personal care plan. Depending upon the condition, your plan  could have included both over the counter or prescription medications. Please review your pharmacy choice. Be sure that the pharmacy you have chosen is open so that you can pick up your prescription now.  If there is a problem you may message your provider in [place] to have the prescription routed to another pharmacy. Your safety is important to [place]. If you have drug allergies check your prescription carefully.  For the next 24 hours, you can use MyChart to ask questions about today's visit, request a non-urgent call back, or ask for a work or school excuse from your e-visit provider. You will get an email in the next two days asking about your experience. I hope that your e-visit has been valuable and will speed your recovery.

## 2018-10-29 DIAGNOSIS — H5213 Myopia, bilateral: Secondary | ICD-10-CM | POA: Diagnosis not present

## 2018-11-05 ENCOUNTER — Telehealth: Payer: Medicaid Other | Admitting: Family

## 2018-11-05 ENCOUNTER — Encounter (INDEPENDENT_AMBULATORY_CARE_PROVIDER_SITE_OTHER): Payer: Self-pay

## 2018-11-05 DIAGNOSIS — J029 Acute pharyngitis, unspecified: Secondary | ICD-10-CM | POA: Diagnosis not present

## 2018-11-05 DIAGNOSIS — H9209 Otalgia, unspecified ear: Secondary | ICD-10-CM | POA: Diagnosis not present

## 2018-11-05 MED ORDER — FLUTICASONE PROPIONATE 50 MCG/ACT NA SUSP: 2.0000 | Freq: Every day | NASAL | 6 refills | Status: DC

## 2018-11-05 NOTE — Progress Notes (Signed)
E-Visit for Corona Virus Screening   Your current symptoms could be consistent with the coronavirus.  Many health care providers can now test patients at their office but not all are.  [facility] has multiple testing sites. For information on our COVID testing locations and hours go to [email or web address]  Please quarantine yourself while awaiting your test results.  We are enrolling you in our [place] for [place] . Daily you will receive a questionnaire within the [place] website. Our COVID 19 response team willl be monitoriing your responses daily.  You can go to one of the  testing sites listed below, while they are opened (see hours). You do need to self isolate until your results return and if positive 14 days from when your symptoms started and until you are 3 days symptom free.   Testing Locations (Monday - Friday, 8 a.m. - 3:30 p.m.) . [place]: [facility], [address], [place], [place]: [place], [place], [place], [place] (entrance off [address])  . [place], [place], [place] (across from [facility] Department)  I have sent in a prescription of flonase that you will use twice a day.   Approximately 5 minutes was spent documenting and reviewing patient's chart.    COVID-19 is a respiratory illness with symptoms that are similar to the flu. Symptoms are typically mild to moderate, but there have been cases of severe illness and death due to the virus. The following symptoms may appear 2-14 days after exposure: . Fever . Cough . Shortness of breath or difficulty breathing . Chills . Repeated shaking with chills . Muscle pain . Headache . Sore throat . New loss of taste or smell . Fatigue . Congestion or runny nose . Nausea or vomiting . Diarrhea  It is vitally important that if you feel that you have an  infection such as this virus or any other virus that you stay home and away from places where you may spread it to others.  You should self-quarantine for 14 days if you have symptoms that could potentially be coronavirus or have been in close contact a with a person diagnosed with COVID-19 within the last 2 weeks. You should avoid contact with people age 3 and older.   You should wear a mask or cloth face covering over your nose and mouth if you must be around other people or animals, including pets (even at home). Try to stay at least 6 feet away from other people. This will protect the people around you.   You may also take acetaminophen (Tylenol) as needed for fever.   Reduce your risk of any infection by using the same precautions used for avoiding the common cold or flu:  Marland Kitchen Wash your hands often with soap and warm water for at least 20 seconds.  If soap and water are not readily available, use an alcohol-based hand sanitizer with at least 60% alcohol.  . If coughing or sneezing, cover your mouth and nose by coughing or sneezing into the elbow areas of your shirt or coat, into a tissue or into your sleeve (not your hands). . Avoid shaking hands with others and consider head nods or verbal greetings only. . Avoid touching your eyes, nose, or mouth with unwashed hands.  . Avoid close contact with people who are sick. . Avoid places or events with large numbers of people in one location,  like concerts or sporting events. . Carefully consider travel plans you have or are making. . If you are planning any travel outside or inside the [place], visit the CDC's Travelers' Health webpage for the latest health notices. . If you have some symptoms but not all symptoms, continue to monitor at home and seek medical attention if your symptoms worsen. . If you are having a medical emergency, call 911.  HOME CARE . Only take medications as instructed by your medical team. . Drink plenty of fluids and get  plenty of rest. . A steam or ultrasonic humidifier can help if you have congestion.   GET HELP RIGHT AWAY IF YOU HAVE EMERGENCY WARNING SIGNS** FOR COVID-19. If you or someone is showing any of these signs seek emergency medical care immediately. Call 911 or proceed to your closest emergency facility if: . You develop worsening high fever. . Trouble breathing . Bluish lips or face . Persistent pain or pressure in the chest . New confusion . Inability to wake or stay awake . You cough up blood. . Your symptoms become more severe  **This list is not all possible symptoms. Contact your medical provider for any symptoms that are sever or concerning to you.   MAKE SURE YOU   Understand these instructions.  Will watch your condition.  Will get help right away if you are not doing well or get worse.  Your e-visit answers were reviewed by a board certified advanced clinical practitioner to complete your personal care plan.  Depending on the condition, your plan could have included both over the counter or prescription medications.  If there is a problem please reply once you have received a response from your provider.  Your safety is important to us.  If you have drug allergies check your prescription carefully.    You can use MyChart to ask questions about today's visit, request a non-urgent call back, or ask for a work or school excuse for 24 hours related to this e-Visit. If it has been greater than 24 hours you will need to follow up with your provider, or enter a new e-Visit to address those concerns. You will get an e-mail in the next two days asking about your experience.  I hope that your e-visit has been valuable and will speed your recovery. Thank you for using e-visits.

## 2018-11-06 ENCOUNTER — Encounter (INDEPENDENT_AMBULATORY_CARE_PROVIDER_SITE_OTHER): Payer: Self-pay

## 2018-11-07 ENCOUNTER — Encounter (INDEPENDENT_AMBULATORY_CARE_PROVIDER_SITE_OTHER): Payer: Self-pay

## 2018-11-08 ENCOUNTER — Encounter (INDEPENDENT_AMBULATORY_CARE_PROVIDER_SITE_OTHER): Payer: Self-pay

## 2018-11-09 ENCOUNTER — Encounter (INDEPENDENT_AMBULATORY_CARE_PROVIDER_SITE_OTHER): Payer: Self-pay

## 2018-11-10 ENCOUNTER — Ambulatory Visit: Payer: Medicaid Other | Admitting: Orthopaedic Surgery

## 2018-11-10 ENCOUNTER — Encounter: Payer: Self-pay | Admitting: Orthopaedic Surgery

## 2018-11-10 ENCOUNTER — Other Ambulatory Visit: Payer: Self-pay

## 2018-11-10 ENCOUNTER — Ambulatory Visit (INDEPENDENT_AMBULATORY_CARE_PROVIDER_SITE_OTHER): Payer: Medicaid Other

## 2018-11-10 ENCOUNTER — Encounter (INDEPENDENT_AMBULATORY_CARE_PROVIDER_SITE_OTHER): Payer: Self-pay

## 2018-11-10 VITALS — BP 119/86 | HR 92 | Ht 62.0 in | Wt 196.0 lb

## 2018-11-10 DIAGNOSIS — G8929 Other chronic pain: Secondary | ICD-10-CM

## 2018-11-10 DIAGNOSIS — M25562 Pain in left knee: Secondary | ICD-10-CM

## 2018-11-10 MED ORDER — BUPIVACAINE HCL 0.5 % IJ SOLN
2.0000 mL | INTRAMUSCULAR | Status: AC | PRN
  Administered 2018-11-10: 11:00:00 2 mL via INTRA_ARTICULAR

## 2018-11-10 MED ORDER — METHYLPREDNISOLONE ACETATE 40 MG/ML IJ SUSP
80.0000 mg | INTRAMUSCULAR | Status: AC | PRN
  Administered 2018-11-10: 80 mg via INTRA_ARTICULAR

## 2018-11-10 MED ORDER — LIDOCAINE HCL 1 % IJ SOLN
2.0000 mL | INTRAMUSCULAR | Status: AC | PRN
  Administered 2018-11-10: 11:00:00 2 mL

## 2018-11-10 NOTE — Progress Notes (Signed)
Office Visit Note   Patient: [name]           Date of Birth: 1985-12-25           MRN: [id number] Visit Date: 11/10/2018              Requested by: Antony Blackbird, MD [place],  [place] [id number] PCP: Antony Blackbird, MD   Assessment & Plan: Visit Diagnoses:  1. Chronic pain of left knee     Plan: Left knee pain could be a combination of factors.  Mild chondromalacia patella.  And some medial joint tenderness.  Could have a small tear of the medial meniscus.  Will inject with cortisone and monitor response.  Consider MRI scan if no improvement  Follow-Up Instructions: Return if symptoms worsen or fail to improve.   Orders:  Orders Placed This Encounter  Procedures  . Large Joint Inj: L knee  . XR KNEE 3 VIEW LEFT   No orders of the defined types were placed in this encounter.     Procedures: Large Joint Inj: L knee on 11/10/2018 11:18 AM Indications: pain and diagnostic evaluation Details: 25 G 1.5 in needle, anteromedial approach  Arthrogram: No  Medications: 2 mL lidocaine 1 %; 2 mL bupivacaine 0.5 %; 80 mg methylPREDNISolone acetate 40 MG/ML Procedure, treatment alternatives, risks and benefits explained, specific risks discussed. Consent was given by the patient. Patient was prepped and draped in the usual sterile fashion.       Clinical Data: No additional findings.   Subjective: Chief Complaint  Patient presents with  . Left Knee - Pain  Patient presents today for left knee pain X months. No known injury. Patient states that the pain is on and off. She has pain across the anterior and then wraps around to the backside. No swelling. She does not take anything for pain. She has been to [facility] urgent care before and has been prescribed antiinflammatories and knee brace. She said that it helps for awhile, but then returns again.  No recurrent effusions.  No instability.  No history of locking.  No proximal pain  HPI  Review of Systems   Objective: Vital Signs: BP 119/86   Pulse 92   Ht 5\' 2"  (1.575 m)   Wt 196 lb (88.9 kg)   LMP 10/29/2018   BMI 35.85 kg/m   Physical Exam Constitutional:      Appearance: She is well-developed.  Eyes:     Pupils: Pupils are equal, round, and reactive to light.  Pulmonary:     Effort: Pulmonary effort is normal.  Skin:    General: Skin is warm and dry.  Neurological:     Mental Status: She is alert and oriented to person, place, and time.  Psychiatric:        Behavior: Behavior normal.     Ortho Exam awake alert and oriented x3.  Comfortable sitting.  Straight leg raise negative.  Painless range of motion both hips.  Left knee with diffuse mild medial joint pain.  No popping or clicking about the patella.  Minimal pain with patellar compression.  No plical pain.  No instability.  No popliteal fullness.  No calf pain or distal edema  Specialty Comments:  No specialty comments available.  Imaging: Xr Knee 3 View Left  Result Date: 11/10/2018 Films of the left knee obtained in 3 projections standing.  There may be slight subchondral sclerosis in the medial tibial joint surface but no ectopic  calcification or joint space narrowing.  Alignment is normal.  Patella view is inadequate but it appears to track in the midline no acute changes.    PMFS History: Patient Active Problem List   Diagnosis Date Noted  . Migraine headache with aura   . GERD 12/01/2006   Past Medical History:  Diagnosis Date  . Depression   . GERD (gastroesophageal reflux disease)   . Hx of percutaneous transcatheter closure of congenital VSD   . Migraine headache with aura   . Panic attack     Family History  Problem Relation Age of Onset  . Congestive Heart Failure Other   . Asthma Paternal Aunt   . Congestive Heart Failure Paternal Uncle   . Cancer Paternal Grandmother        breast  . Stroke Paternal Grandfather   . Asthma Cousin   . Healthy Mother   . Hypertension Father   . Diabetes  Father     Past Surgical History:  Procedure Laterality Date  . CARDIAC SURGERY     as a baby  . TONSILLECTOMY     Social History   Occupational History  . Not on file  Tobacco Use  . Smoking status: Former Smoker    Quit date: 08/16/2010    Years since quitting: 8.2  . Smokeless tobacco: Never Used  Substance and Sexual Activity  . Alcohol use: Yes    Comment: rare  . Drug use: No  . Sexual activity: Yes    Birth control/protection: None    Comment: trying to conceive

## 2018-11-11 ENCOUNTER — Encounter (INDEPENDENT_AMBULATORY_CARE_PROVIDER_SITE_OTHER): Payer: Self-pay

## 2018-11-14 ENCOUNTER — Encounter (INDEPENDENT_AMBULATORY_CARE_PROVIDER_SITE_OTHER): Payer: Self-pay

## 2018-11-24 DIAGNOSIS — F330 Major depressive disorder, recurrent, mild: Secondary | ICD-10-CM | POA: Diagnosis not present

## 2018-11-30 DIAGNOSIS — F330 Major depressive disorder, recurrent, mild: Secondary | ICD-10-CM | POA: Diagnosis not present

## 2018-12-02 DIAGNOSIS — F330 Major depressive disorder, recurrent, mild: Secondary | ICD-10-CM | POA: Diagnosis not present

## 2018-12-09 DIAGNOSIS — F330 Major depressive disorder, recurrent, mild: Secondary | ICD-10-CM | POA: Diagnosis not present

## 2018-12-23 DIAGNOSIS — F330 Major depressive disorder, recurrent, mild: Secondary | ICD-10-CM | POA: Diagnosis not present

## 2018-12-27 DIAGNOSIS — F330 Major depressive disorder, recurrent, mild: Secondary | ICD-10-CM | POA: Diagnosis not present

## 2018-12-28 DIAGNOSIS — F330 Major depressive disorder, recurrent, mild: Secondary | ICD-10-CM | POA: Diagnosis not present

## 2019-01-03 DIAGNOSIS — F330 Major depressive disorder, recurrent, mild: Secondary | ICD-10-CM | POA: Diagnosis not present

## 2019-01-10 DIAGNOSIS — F330 Major depressive disorder, recurrent, mild: Secondary | ICD-10-CM | POA: Diagnosis not present

## 2019-01-27 DIAGNOSIS — F330 Major depressive disorder, recurrent, mild: Secondary | ICD-10-CM | POA: Diagnosis not present

## 2019-02-10 DIAGNOSIS — F330 Major depressive disorder, recurrent, mild: Secondary | ICD-10-CM | POA: Diagnosis not present

## 2019-02-11 DIAGNOSIS — F330 Major depressive disorder, recurrent, mild: Secondary | ICD-10-CM | POA: Diagnosis not present

## 2019-02-24 DIAGNOSIS — F330 Major depressive disorder, recurrent, mild: Secondary | ICD-10-CM | POA: Diagnosis not present

## 2019-03-10 DIAGNOSIS — F330 Major depressive disorder, recurrent, mild: Secondary | ICD-10-CM | POA: Diagnosis not present

## 2019-03-24 DIAGNOSIS — F330 Major depressive disorder, recurrent, mild: Secondary | ICD-10-CM | POA: Diagnosis not present

## 2019-04-07 DIAGNOSIS — F330 Major depressive disorder, recurrent, mild: Secondary | ICD-10-CM | POA: Diagnosis not present

## 2019-04-21 DIAGNOSIS — F330 Major depressive disorder, recurrent, mild: Secondary | ICD-10-CM | POA: Diagnosis not present

## 2019-05-05 DIAGNOSIS — F330 Major depressive disorder, recurrent, mild: Secondary | ICD-10-CM | POA: Diagnosis not present

## 2019-05-19 DIAGNOSIS — F330 Major depressive disorder, recurrent, mild: Secondary | ICD-10-CM | POA: Diagnosis not present

## 2019-06-02 DIAGNOSIS — F330 Major depressive disorder, recurrent, mild: Secondary | ICD-10-CM | POA: Diagnosis not present

## 2019-06-16 DIAGNOSIS — F330 Major depressive disorder, recurrent, mild: Secondary | ICD-10-CM | POA: Diagnosis not present

## 2019-06-30 DIAGNOSIS — F330 Major depressive disorder, recurrent, mild: Secondary | ICD-10-CM | POA: Diagnosis not present

## 2019-07-14 DIAGNOSIS — F330 Major depressive disorder, recurrent, mild: Secondary | ICD-10-CM | POA: Diagnosis not present

## 2019-07-28 DIAGNOSIS — F330 Major depressive disorder, recurrent, mild: Secondary | ICD-10-CM | POA: Diagnosis not present

## 2019-08-25 ENCOUNTER — Ambulatory Visit: Payer: Medicaid Other | Attending: Family Medicine | Admitting: Family Medicine

## 2019-08-25 ENCOUNTER — Encounter: Payer: Self-pay | Admitting: Family Medicine

## 2019-08-25 ENCOUNTER — Other Ambulatory Visit: Payer: Self-pay

## 2019-08-25 VITALS — BP 112/80 | HR 84 | Temp 97.7°F | Ht 62.0 in | Wt 213.2 lb

## 2019-08-25 DIAGNOSIS — N926 Irregular menstruation, unspecified: Secondary | ICD-10-CM | POA: Diagnosis not present

## 2019-08-25 DIAGNOSIS — Z8742 Personal history of other diseases of the female genital tract: Secondary | ICD-10-CM | POA: Diagnosis not present

## 2019-08-25 DIAGNOSIS — E669 Obesity, unspecified: Secondary | ICD-10-CM

## 2019-08-25 DIAGNOSIS — R5383 Other fatigue: Secondary | ICD-10-CM

## 2019-08-25 NOTE — Progress Notes (Signed)
Need referral to GYN or endocrinologist for her PCOS

## 2019-08-25 NOTE — Progress Notes (Signed)
Established Patient Office Visit  Subjective:  Patient ID: [name], female    DOB: [birth year]  Age: 34 y.o. MRN: [id number]  CC: History of PCOS and would like referral to GYN or endocrinology in follow-up-Sharah Finnell MD  HPI [name], 34 year old female, who presents secondary to the complaint of issues with difficulty losing weight and keeping it off, obesity, irregular menses, history of PCOS and she has not had a period since March of this year.  She did take a urine home pregnancy test recently which was negative.  She does currently feel as if she is fatigued but she denies any nausea, breast tenderness or peripheral edema.  She did see gynecology in the past regarding PCOS and she wonders if she needs referral to gynecology or endocrinology for further follow-up and treatment.  She denies any prior history of prediabetes or diabetes.  She has had no increased thirst or urinary frequency.  She believes that Metformin may have been discussed in the past but then she had normal periods for a while and did not continue to follow-up with gynecology regarding PCOS.  Past Medical History:  Diagnosis Date  . Depression   . GERD (gastroesophageal reflux disease)   . Hx of percutaneous transcatheter closure of congenital VSD   . Migraine headache with aura   . Panic attack     Past Surgical History:  Procedure Laterality Date  . CARDIAC SURGERY     as a baby  . TONSILLECTOMY      Family History  Problem Relation Age of Onset  . Congestive Heart Failure Other   . Asthma Paternal Aunt   . Congestive Heart Failure Paternal Uncle   . Cancer Paternal Grandmother        breast  . Stroke Paternal Grandfather   . Asthma Cousin   . Healthy Mother   . Hypertension Father   . Diabetes Father     Social History   Socioeconomic History  . Marital status: Divorced    Spouse name: Not on file  . Number of children: Not on file  . Years of education: Not on file  .  Highest education level: Not on file  Occupational History  . Not on file  Tobacco Use  . Smoking status: Former Smoker    Quit date: 08/16/2010    Years since quitting: 9.0  . Smokeless tobacco: Never Used  Substance and Sexual Activity  . Alcohol use: Yes    Comment: rare  . Drug use: No  . Sexual activity: Yes    Birth control/protection: None    Comment: trying to conceive  Other Topics Concern  . Not on file  Social History Narrative  . Not on file   Social Determinants of Health   Financial Resource Strain:   . Difficulty of Paying Living Expenses:   Food Insecurity:   . Worried About Programme researcher, broadcasting/film/video in the Last Year:   . Barista in the Last Year:   Transportation Needs:   . Freight forwarder (Medical):   Marland Kitchen Lack of Transportation (Non-Medical):   Physical Activity:   . Days of Exercise per Week:   . Minutes of Exercise per Session:   Stress:   . Feeling of Stress :   Social Connections:   . Frequency of Communication with Friends and Family:   . Frequency of Social Gatherings with Friends and Family:   . Attends Religious Services:   . Active  Member of Clubs or Organizations:   . Attends Banker Meetings:   Marland Kitchen Marital Status:   Intimate Partner Violence:   . Fear of Current or Ex-Partner:   . Emotionally Abused:   Marland Kitchen Physically Abused:   . Sexually Abused:     Outpatient Medications Prior to Visit  Medication Sig Dispense Refill  . FLUoxetine (PROZAC) 20 MG tablet Take 20 mg by mouth daily.    . fluticasone (FLONASE) 50 MCG/ACT nasal spray Place 2 sprays into both nostrils daily. (Patient taking differently: Place 2 sprays into both nostrils as needed. ) 16 g 6  . hydrOXYzine (ATARAX/VISTARIL) 25 MG tablet Take 25 mg by mouth as needed.    . fluocinolone (VANOS) 0.01 % cream Apply topically 2 (two) times daily. 30 g 0  . triamcinolone cream (KENALOG) 0.1 % Apply 1 application topically 2 (two) times daily. To rash on leg 30 g 0     No facility-administered medications prior to visit.    No Known Allergies  ROS Review of Systems  Constitutional: Positive for fatigue. Negative for chills and fever.  HENT: Negative for sore throat and trouble swallowing.   Eyes: Positive for visual disturbance (Wears glasses). Negative for photophobia.  Respiratory: Negative for cough and shortness of breath.   Gastrointestinal: Negative for abdominal pain, blood in stool, constipation, diarrhea and nausea.  Endocrine: Negative for cold intolerance, heat intolerance, polydipsia, polyphagia and polyuria.  Genitourinary: Positive for menstrual problem. Negative for dysuria and frequency.  Musculoskeletal: Negative for arthralgias and back pain.  Neurological: Negative for dizziness and headaches.  Hematological: Negative for adenopathy. Does not bruise/bleed easily.  Psychiatric/Behavioral: Negative for self-injury and suicidal ideas.      Objective:    Physical Exam  Constitutional: She is oriented to person, place, and time.  Well-nourished well-developed overweight for height female in no acute distress.  She is wearing a mask as per office COVID-19 protocol.  Neck: No JVD present. No thyromegaly present.  Cardiovascular: Normal rate and regular rhythm.  Pulmonary/Chest: Effort normal and breath sounds normal.  Abdominal: Soft. There is no abdominal tenderness. There is no rebound and no guarding.  Musculoskeletal:        General: No tenderness or edema.     Cervical back: Normal range of motion and neck supple.  Lymphadenopathy:    She has no cervical adenopathy.  Neurological: She is alert and oriented to person, place, and time.  Skin: Skin is warm and dry.  Psychiatric: She has a normal mood and affect. Her behavior is normal.   BP 112/80   Pulse 84   Temp 97.7 F (36.5 C) (Temporal)   Ht 5\' 2"  (1.575 m)   Wt 213 lb 3.2 oz (96.7 kg)   LMP 06/13/2019 (Approximate)   SpO2 97%   BMI 38.99 kg/m   Wt Readings  from Last 3 Encounters:  08/25/19 213 lb 3.2 oz (96.7 kg)  11/10/18 196 lb (88.9 kg)  06/23/18 207 lb 8 oz (94.1 kg)     Health Maintenance Due  Topic Date Due  . COVID-19 Vaccine (1) Never done     Lab Results  Component Value Date   TSH 2.300 08/25/2019   Lab Results  Component Value Date   WBC 8.9 12/05/2012   HGB 12.4 12/05/2012   HCT 35.9 (L) 12/05/2012   MCV 81.6 12/05/2012   PLT 202 12/05/2012   Lab Results  Component Value Date   NA 135 12/05/2012   K 3.9  12/05/2012   CO2 17 (L) 12/05/2012   GLUCOSE 85 12/05/2012   BUN 11 12/05/2012   CREATININE 0.47 (L) 12/05/2012   BILITOT 0.2 (L) 12/05/2012   ALKPHOS 116 12/05/2012   AST 13 12/05/2012   ALT 6 12/05/2012   PROT 5.6 (L) 12/05/2012   ALBUMIN 2.6 (L) 12/05/2012   CALCIUM 9.2 12/05/2012   No results found for: CHOL No results found for: HDL No results found for: LDLCALC No results found for: TRIG No results found for: CHOLHDL No results found for: HGBA1C    Assessment & Plan:  1. Irregular menses; 3. History of PCOS Patient with complaint of irregular menses and has prior history of PCOS for which she was followed by gynecology and she would like referral for further evaluation and treatment.  Referral placed to gynecology due to irregular menses and history of PCOS.  We will also check blood work for other factors which can affect her menses including T4 and TSH to look for thyroid disorder and basic metabolic panel and hemoglobin A1c to look for elevated blood sugar/prediabetes or diabetes as a contributing factor. - Ambulatory referral to Gynecology - T4 AND TSH - Hemoglobin Y6T - Basic metabolic panel  2. Late menses Patient with late menses and history of PCOS.  She will be referred to gynecology for further evaluation.  She has had recent negative urine pregnancy test at home therefore will perform serum beta hCG quantitative to make sure that patient is not pregnant at this time. - Ambulatory  referral to Gynecology - Beta hCG quant (ref lab)  3. History of PCOS 4. Obesity (BMI 30-39.9) Due to patient's obesity, she is at increased risk of prediabetes or diabetes and will check basic metabolic panel for elevated glucose as well as hemoglobin A1c to look for prediabetes or diabetes.  She will also have T4 and TSH to see if thyroid disorder may be contributing to her obesity. - T4 AND TSH - Basic Metabolic Panel - Hemoglobin A1c  5. Fatigue, unspecified type Patient with complaint of fatigue and in addition patient with history of PCOS, obesity and late menses.  Will check T4 and TSH to look for thyroid disorder, basic metabolic panel to look for electrolyte abnormality or elevated blood sugar, CBC to look for anemia or blood disorder, beta hCG to make sure that patient is not currently pregnant and hemoglobin A1c to look for prediabetes or diabetes which may be contributing to her fatigue.  Regular exercise and healthy diet also encouraged as well as vitamin D supplement. - T4 AND TSH - Basic Metabolic Panel - CBC - Beta hCG quant (ref lab) - Hemoglobin A1c    An After Visit Summary was printed and given to the patient.   Follow-up: Return for fatigue- as needed and will notify you of labs.   Approximately 20 minutes of face-to-face time was spent with the patient obtaining her medical history/current medical issues, review of systems, performance of exam and formulation of assessment and discussion of treatment plan with the patient and ordering appropriate labs.  An additional 11 minutes was required for review of chart and completion of today's visit note  Antony Blackbird, MD

## 2019-08-26 LAB — T4 AND TSH
T4, Total: 7.8 ug/dL (ref 4.5–12.0)
TSH: 2.3 u[IU]/mL (ref 0.450–4.500)

## 2019-08-30 ENCOUNTER — Encounter (INDEPENDENT_AMBULATORY_CARE_PROVIDER_SITE_OTHER): Payer: Self-pay | Admitting: *Deleted

## 2019-09-19 ENCOUNTER — Other Ambulatory Visit: Payer: Self-pay

## 2019-09-19 ENCOUNTER — Encounter: Payer: Self-pay | Admitting: Obstetrics & Gynecology

## 2019-09-19 ENCOUNTER — Ambulatory Visit: Payer: Medicaid Other | Admitting: Obstetrics & Gynecology

## 2019-09-19 VITALS — BP 128/83 | HR 99 | Wt 218.0 lb

## 2019-09-19 DIAGNOSIS — E282 Polycystic ovarian syndrome: Secondary | ICD-10-CM | POA: Diagnosis not present

## 2019-09-19 DIAGNOSIS — N970 Female infertility associated with anovulation: Secondary | ICD-10-CM

## 2019-09-19 MED ORDER — METFORMIN HCL 1000 MG PO TABS: ORAL_TABLET | ORAL | 5 refills | Status: DC

## 2019-09-19 MED ORDER — MEDROXYPROGESTERONE ACETATE 10 MG PO TABS: 10.0000 mg | ORAL_TABLET | Freq: Every day | ORAL | 5 refills | Status: DC

## 2019-09-19 MED ORDER — LETROZOLE 2.5 MG PO TABS: 2.5000 mg | ORAL_TABLET | Freq: Every day | ORAL | 2 refills | Status: DC

## 2019-09-19 NOTE — Progress Notes (Signed)
GYNECOLOGY OFFICE VISIT NOTE  History:   [name] is a 34 y.o. [phone] here today for management of secondary infertility. History of spontaneously conceived pregnancies, but was diagnosed with PCOS.  Has tried ovulation tracking for past six months, had irregular periods.  Had discussed with Dr. Jolayne Panther in past, and she desires ovulation induction agents. She denies any abnormal vaginal discharge, bleeding, pelvic pain or other concerns.    Past Medical History:  Diagnosis Date  . Depression   . GERD (gastroesophageal reflux disease)   . Hx of percutaneous transcatheter closure of congenital VSD   . Migraine headache with aura   . Panic attack     Past Surgical History:  Procedure Laterality Date  . CARDIAC SURGERY     as a baby  . TONSILLECTOMY      The following portions of the patient's history were reviewed and updated as appropriate: allergies, current medications, past family history, past medical history, past social history, past surgical history and problem list.   Health Maintenance:  Normal pap and negative HRHPV on 06/10/2018  Review of Systems:  Pertinent items noted in HPI and remainder of comprehensive ROS otherwise negative.  Physical Exam:  BP 128/83   Pulse 99   Wt 218 lb (98.9 kg)   LMP 08/29/2019   BMI 39.87 kg/m  CONSTITUTIONAL: Well-developed, well-nourished female in no acute distress.  HEENT:  Normocephalic, atraumatic. External right and left ear normal. No scleral icterus.  NECK: Normal range of motion, supple, no masses noted on observation SKIN: No rash noted. Not diaphoretic. No erythema. No pallor. MUSCULOSKELETAL: Normal range of motion. No edema noted. NEUROLOGIC: Alert and oriented to person, place, and time. Normal muscle tone coordination. No cranial nerve deficit noted. PSYCHIATRIC: Normal mood and affect. Normal behavior. Normal judgment and thought content. CARDIOVASCULAR: Normal heart rate noted RESPIRATORY: Effort and  breath sounds normal, no problems with respiration noted ABDOMEN: No masses noted. No other overt distention noted.   PELVIC: Deferred  Labs and Imaging     Assessment and Plan:      1. Secondary anovulatory infertility 2. PCOS (polycystic ovarian syndrome) Counseled patient about management of PCOS, recommended weight loss which helps with restoring ovulatory cycles, decreases glucose intolerance with improvement of metabolic risk, improves fertility/pregnancy rates and helps with overall health.  Even modest weight loss (5 to 10 percent reduction in body weight) in women with PCOS may result in these effects.    PCOS is also treated with Metformin given its association with glucose intolerance and insulin resistance.  Over 50% of PCOS patients on 1500mg  of Metformin daily have been shown to ovulate successfully. Common side effects include GI intolerance, kidney and liver enzyme irregularities, lactic acidosis.  She will need baseline BUN,Cr, LFTs prior to starting therapy; CMET checked today. HgA1C, CBC and HCG also checked.  She will also be instructed to stop therapy if she anticipates major stresses, such as surgery or IVF, in order to avoid lactic acidosis.  Patient will return in 2 months for followup and repeat CMET check. Bleeding precautions reviewed.  The patient was presented with management with Femara (letrozole) in addition to Metformin to maximize chances of conception.   When prescribing letrozole, the starting dose is 2.5 mg/day cycle days 3 to 7 following a spontaneous menses or progestin-induced bleed. If the cycle is ovulatory but pregnancy has not occurred, the same dose should be used in the next cycle. If ovulation does not occur, the dose should  be increased to 5 mg/day cycle days 3 to 7, with a maximal dose of 7.5 mg/day. Sequential dose escalation of 2.5, 5.0, and 7.5 mg if ovulation does not occur on lower doses is widely used by reproductive endocrinologists. Higher doses  (7.5 mg) appear to be associated with a thinning of the endometrium similar to that seen with clomiphene citrate.  Patient also advised about LH/ovulation predictor kits,  which can help in knowing when to have frequent intercourse at least every other day around the time of ovulation.  The next steps in evaluation of infertility involve doing a semen analysis then HSG as subsequent evaluation.  Patient was given the option to be referred to a Reproductive Endocrinology and Infertility Specialist now or at any time, she declines referral for now.   - letrozole (FEMARA) 2.5 MG tablet; Take 1 tablet (2.5 mg total) by mouth daily. Take on days 3 to 7 following a spontaneous menses or progestin-induced bleed.  Dispense: 5 tablet; Refill: 2 - metFORMIN (GLUCOPHAGE) 1000 MG tablet; Take 1/2 tablet by mouth daily for one week. Then increase to one tablet daily for one week.  Then one tablet twice a day.  Dispense: 60 tablet; Refill: 5 - CBC - Comprehensive metabolic panel - Hemoglobin A1c - Beta hCG quant (ref lab) - medroxyPROGESTERone (PROVERA) 10 MG tablet; Take 1 tablet (10 mg total) by mouth daily. Use for ten days  Dispense: 10 tablet; Refill: 5    Please refer to After Visit Summary for other counseling recommendations.   Return in about 2 months (around 11/19/2019) for PCOS Infertility Follow up and CMET check .    Total face-to-face time with patient: 20 minutes.  Over 50% of encounter was spent on counseling and coordination of care.   Verita Schneiders, MD, [place] for [employer], [place]

## 2019-09-19 NOTE — Patient Instructions (Signed)
Counseled patient about management of PCOS, recommended weight loss which helps with restoring ovulatory cycles, decreases glucose intolerance with improvement of metabolic risk, improves fertility/pregnancy rates and helps with overall health.  Even modest weight loss (5 to 10 percent reduction in body weight) in women with PCOS may result in these effects.    PCOS is also treated with Metformin given its association with glucose intolerance and insulin resistance.  Over 50% of PCOS patients on 1500mg  of Metformin daily have been shown to ovulate successfully. Common side effects include GI intolerance, kidney and liver enzyme irregularities, lactic acidosis.  She will need baseline BUN,Cr, LFTs prior to starting therapy; CMET checked today. HgA1C and TSH also checked.  She will also be instructed to stop therapy if she anticipates major stresses, such as surgery or IVF, in order to avoid lactic acidosis.  Patient will return in 2 months for followup and repeat CMET check. Bleeding precautions reviewed.  The patient was presented with management with Femara (letrozole) in addition to Metformin to maximize chances of conception.    When prescribing letrozole, the starting dose is 2.5 mg/day cycle days 3 to 7 following a spontaneous menses or progestin-induced bleed. If the cycle is ovulatory but pregnancy has not occurred, the same dose should be used in the next cycle. If ovulation does not occur, the dose should be increased to 5 mg/day cycle days 3 to 7, with a maximal dose of 7.5 mg/day. Sequential dose escalation of 2.5, 5.0, and 7.5 mg if ovulation does not occur on lower doses is widely used by reproductive endocrinologists. Higher doses (7.5 mg) appear to be associated with a thinning of the endometrium similar to that seen with clomiphene citrate.  Patient also advised about LH/ovulation predictor kits,  which can help in knowing when to have frequent intercourse at least every other day around the  time of ovulation.  The next steps in evaluation of infertility involve doing a semen analysis then HSG as subsequent evaluation.  Patient was given the option to be referred to a Reproductive Endocrinology and Infertility Specialist now or at any time, she declines referral for now.

## 2019-09-20 LAB — CBC
Hematocrit: 39.2 % (ref 34.0–46.6)
Hemoglobin: 13 g/dL (ref 11.1–15.9)
MCH: 28 pg (ref 26.6–33.0)
MCHC: 33.2 g/dL (ref 31.5–35.7)
MCV: 85 fL (ref 79–97)
Platelets: 309 10*3/uL (ref 150–450)
RBC: 4.64 x10E6/uL (ref 3.77–5.28)
RDW: 12.9 % (ref 11.7–15.4)
WBC: 8.4 10*3/uL (ref 3.4–10.8)

## 2019-09-20 LAB — COMPREHENSIVE METABOLIC PANEL
ALT: 8 IU/L (ref 0–32)
AST: 15 IU/L (ref 0–40)
Albumin/Globulin Ratio: 1.8 (ref 1.2–2.2)
Albumin: 4.4 g/dL (ref 3.8–4.8)
Alkaline Phosphatase: 66 IU/L (ref 48–121)
BUN/Creatinine Ratio: 24 — ABNORMAL HIGH (ref 9–23)
BUN: 16 mg/dL (ref 6–20)
Bilirubin Total: 0.2 mg/dL (ref 0.0–1.2)
CO2: 18 mmol/L — ABNORMAL LOW (ref 20–29)
Calcium: 9.3 mg/dL (ref 8.7–10.2)
Chloride: 107 mmol/L — ABNORMAL HIGH (ref 96–106)
Creatinine, Ser: 0.66 mg/dL (ref 0.57–1.00)
GFR calc Af Amer: 135 mL/min/{1.73_m2} (ref >59)
GFR calc non Af Amer: 117 mL/min/{1.73_m2} (ref >59)
Globulin, Total: 2.5 g/dL (ref 1.5–4.5)
Glucose: 98 mg/dL (ref 65–99)
Potassium: 4.3 mmol/L (ref 3.5–5.2)
Sodium: 140 mmol/L (ref 134–144)
Total Protein: 6.9 g/dL (ref 6.0–8.5)

## 2019-09-20 LAB — BETA HCG QUANT (REF LAB): hCG Quant: <1 m[IU]/mL

## 2019-09-20 LAB — HEMOGLOBIN A1C
Est. average glucose Bld gHb Est-mCnc: 111 mg/dL
Hgb A1c MFr Bld: 5.5 % (ref 4.8–5.6)

## 2019-11-17 ENCOUNTER — Other Ambulatory Visit: Payer: Self-pay

## 2019-11-17 ENCOUNTER — Ambulatory Visit (INDEPENDENT_AMBULATORY_CARE_PROVIDER_SITE_OTHER): Payer: Medicaid Other | Admitting: Obstetrics & Gynecology

## 2019-11-17 ENCOUNTER — Encounter: Payer: Self-pay | Admitting: Obstetrics & Gynecology

## 2019-11-17 VITALS — BP 122/81 | HR 92 | Ht 62.0 in | Wt 208.0 lb

## 2019-11-17 DIAGNOSIS — T887XXA Unspecified adverse effect of drug or medicament, initial encounter: Secondary | ICD-10-CM

## 2019-11-17 DIAGNOSIS — E282 Polycystic ovarian syndrome: Secondary | ICD-10-CM | POA: Diagnosis not present

## 2019-11-17 DIAGNOSIS — N970 Female infertility associated with anovulation: Secondary | ICD-10-CM

## 2019-11-17 NOTE — Progress Notes (Signed)
GYNECOLOGY OFFICE VISIT NOTE  History:   [name] is a 34 y.o. [phone] here today for follow up after initiation of Metformin and Femara therapy for PCOS related infertility.  Minimal GI side effects on Metformin, has just completed one cycle of Femara 2.5 mg. Had positive ovulation tests, had intercourse frequency.  No period yet, but not due yet.  No concerns. She denies any abnormal vaginal discharge, bleeding, pelvic pain or other concerns.    Past Medical History:  Diagnosis Date  . Depression   . GERD (gastroesophageal reflux disease)   . Hx of percutaneous transcatheter closure of congenital VSD   . Migraine headache with aura   . Panic attack     Past Surgical History:  Procedure Laterality Date  . CARDIAC SURGERY     as a baby  . TONSILLECTOMY      The following portions of the patient's history were reviewed and updated as appropriate: allergies, current medications, past family history, past medical history, past social history, past surgical history and problem list.   Health Maintenance:  Normal pap and negative HRHPV on 06/10/2018.   Review of Systems:  Pertinent items noted in HPI and remainder of comprehensive ROS otherwise negative.  Physical Exam:  BP 122/81   Pulse 92   Ht 5' 2"  (1.575 m)   Wt 208 lb (94.3 kg)   LMP 10/28/2019 (Exact Date)   BMI 38.04 kg/m  CONSTITUTIONAL: Well-developed, well-nourished female in no acute distress.  MUSCULOSKELETAL: Normal range of motion. No edema noted. NEUROLOGIC: Alert and oriented to person, place, and time. Normal muscle tone coordination. No cranial nerve deficit noted. PSYCHIATRIC: Normal mood and affect. Normal behavior. Normal judgment and thought content. CARDIOVASCULAR: Normal heart rate noted RESPIRATORY: Effort and breath sounds normal, no problems with respiration noted ABDOMEN: No masses noted. No other overt distention noted.   PELVIC: Deferred  Labs and Imaging CMP Latest Ref Rng & Units  09/19/2019 12/05/2012 12/29/2009  Glucose 65 - 99 mg/dL 98 85 88  BUN 6 - 20 mg/dL 16 11 9   Creatinine 0.57 - 1.00 mg/dL 0.66 0.47(L) 0.5  Sodium 134 - 144 mmol/L 140 135 140  Potassium 3.5 - 5.2 mmol/L 4.3 3.9 4.5  Chloride 96 - 106 mmol/L 107(H) 103 103  CO2 20 - 29 mmol/L 18(L) 17(L) -  Calcium 8.7 - 10.2 mg/dL 9.3 9.2 -  Total Protein 6.0 - 8.5 g/dL 6.9 5.6(L) -  Total Bilirubin 0.0 - 1.2 mg/dL 0.2 0.2(L) -  Alkaline Phos 48 - 121 IU/L 66 116 -  AST 0 - 40 IU/L 15 13 -  ALT 0 - 32 IU/L 8 6 -      Assessment and Plan:      1. Secondary anovulatory infertility 2. PCOS (polycystic ovarian syndrome) 3. Metformin side effects monitoring Doing well. Will check CMET today given Metformin use. Continue Metformin and Femara as outlines. If no pregnancy after next cycle, will increase Femara dosage to 5.  Patient offered REI referral again, declines for now. Will continue to monitor closely.  - Comp Met (CMET) Routine preventative health maintenance measures emphasized. Please refer to After Visit Summary for other counseling recommendations.   Return for visits as needed.    Total face-to-face time with patient: 15 minutes.  Over 50% of encounter was spent on counseling and coordination of care.   Verita Schneiders, MD, [place] for [employer], [place]

## 2019-11-18 LAB — COMPREHENSIVE METABOLIC PANEL
ALT: 15 IU/L (ref 0–32)
AST: 22 IU/L (ref 0–40)
Albumin/Globulin Ratio: 2 (ref 1.2–2.2)
Albumin: 4.6 g/dL (ref 3.8–4.8)
Alkaline Phosphatase: 66 IU/L (ref 48–121)
BUN/Creatinine Ratio: 16 (ref 9–23)
BUN: 10 mg/dL (ref 6–20)
Bilirubin Total: 0.5 mg/dL (ref 0.0–1.2)
CO2: 22 mmol/L (ref 20–29)
Calcium: 9.4 mg/dL (ref 8.7–10.2)
Chloride: 103 mmol/L (ref 96–106)
Creatinine, Ser: 0.62 mg/dL (ref 0.57–1.00)
GFR calc Af Amer: 138 mL/min/{1.73_m2} (ref >59)
GFR calc non Af Amer: 120 mL/min/{1.73_m2} (ref >59)
Globulin, Total: 2.3 g/dL (ref 1.5–4.5)
Glucose: 104 mg/dL — ABNORMAL HIGH (ref 65–99)
Potassium: 4.5 mmol/L (ref 3.5–5.2)
Sodium: 137 mmol/L (ref 134–144)
Total Protein: 6.9 g/dL (ref 6.0–8.5)

## 2020-02-01 ENCOUNTER — Other Ambulatory Visit: Payer: Self-pay

## 2020-02-01 ENCOUNTER — Ambulatory Visit (INDEPENDENT_AMBULATORY_CARE_PROVIDER_SITE_OTHER): Payer: Medicaid Other

## 2020-02-01 DIAGNOSIS — Z3480 Encounter for supervision of other normal pregnancy, unspecified trimester: Secondary | ICD-10-CM | POA: Insufficient documentation

## 2020-02-01 DIAGNOSIS — Z3200 Encounter for pregnancy test, result unknown: Secondary | ICD-10-CM

## 2020-02-01 LAB — POCT URINE PREGNANCY: Preg Test, Ur: POSITIVE — AB

## 2020-02-01 MED ORDER — BLOOD PRESSURE KIT DEVI: 1.0000 | 0 refills | Status: DC | PRN

## 2020-02-01 NOTE — Progress Notes (Signed)
..   [name] presents today for UPT. She has no unusual complaints. LMP:12-28-19    OBJECTIVE: Appears well, in no apparent distress.  OB History    Gravida  3   Para  2   Term  2   Preterm      AB      Living  2     SAB      TAB      Ectopic      Multiple      Live Births  2          Home UPT Result:Positive In-Office UPT result:Positive I have reviewed the patient's medical, obstetrical, social, and family histories, and medications.   ASSESSMENT: Positive pregnancy test  PLAN Prenatal care to be completed at: [facility] BP cuff sent [facility] Babyscripts ordered

## 2020-02-01 NOTE — Progress Notes (Signed)
Patient was assessed and managed by nursing staff during this encounter. I have reviewed the chart and agree with the documentation and plan. I have also made any necessary editorial changes.  Selenne Coggin A Kodey Xue, MD 02/01/2020 1:25 PM   

## 2020-02-07 DIAGNOSIS — Z3480 Encounter for supervision of other normal pregnancy, unspecified trimester: Secondary | ICD-10-CM | POA: Diagnosis not present

## 2020-02-14 DIAGNOSIS — Z62810 Personal history of physical and sexual abuse in childhood: Secondary | ICD-10-CM | POA: Diagnosis not present

## 2020-02-14 DIAGNOSIS — F330 Major depressive disorder, recurrent, mild: Secondary | ICD-10-CM | POA: Diagnosis not present

## 2020-02-14 DIAGNOSIS — Z653 Problems related to other legal circumstances: Secondary | ICD-10-CM | POA: Diagnosis not present

## 2020-02-14 DIAGNOSIS — F411 Generalized anxiety disorder: Secondary | ICD-10-CM | POA: Diagnosis not present

## 2020-02-22 ENCOUNTER — Telehealth: Payer: Medicaid Other | Admitting: Nurse Practitioner

## 2020-02-22 DIAGNOSIS — M545 Low back pain, unspecified: Secondary | ICD-10-CM

## 2020-02-22 NOTE — Progress Notes (Signed)
We are sorry that you are not feeling well.  Here is how we plan to help!  Based on what you have shared with me it looks like you mostly have acute back pain.  Acute back pain is defined as musculoskeletal pain that can resolve in 1-3 weeks with conservative treatment.  I have not prescribed anything due to your pregnancy. The safest thing to take is tylenol OTC. If you feel you need something more then that , you will need to reach out to your OB/GYN. Please keep in mind that muscle relaxer's can cause fatigue and should not be taken while at work or driving.  Back pain is very common.  The pain often gets better over time.  The cause of back pain is usually not dangerous.  Most people can learn to manage their back pain on their own.  Home Care  Stay active.  Start with short walks on flat ground if you can.  Try to walk farther each day.  Do not sit, drive or stand in one place for more than 30 minutes.  Do not stay in bed.  Do not avoid exercise or work.  Activity can help your back heal faster.  Be careful when you bend or lift an object.  Bend at your knees, keep the object close to you, and do not twist.  Sleep on a firm mattress.  Lie on your side, and bend your knees.  If you lie on your back, put a pillow under your knees.  Only take medicines as told by your doctor.  Put ice on the injured area.  Put ice in a plastic bag  Place a towel between your skin and the bag  Leave the ice on for 15-20 minutes, 3-4 times a day for the first 2-3 days. 210 After that, you can switch between ice and heat packs.  Ask your doctor about back exercises or massage.  Avoid feeling anxious or stressed.  Find good ways to deal with stress, such as exercise.  Get Help Right Way If:  Your pain does not go away with rest or medicine.  Your pain does not go away in 1 week.  You have new problems.  You do not feel well.  The pain spreads into your legs.  You cannot control when you  poop (bowel movement) or pee (urinate)  You feel sick to your stomach (nauseous) or throw up (vomit)  You have belly (abdominal) pain.  You feel like you may pass out (faint).  If you develop a fever.  Make Sure you:  Understand these instructions.  Will watch your condition  Will get help right away if you are not doing well or get worse.  Your e-visit answers were reviewed by a board certified advanced clinical practitioner to complete your personal care plan.  Depending on the condition, your plan could have included both over the counter or prescription medications.  If there is a problem please reply  once you have received a response from your provider.  Your safety is important to [place].  If you have drug allergies check your prescription carefully.    You can use MyChart to ask questions about today's visit, request a non-urgent call back, or ask for a work or school excuse for 24 hours related to this e-Visit. If it has been greater than 24 hours you will need to follow up with your provider, or enter a new e-Visit to address those concerns.  You will get  an e-mail in the next two days asking about your experience.  I hope that your e-visit has been valuable and will speed your recovery. Thank you for using e-visits.  5-10 minutes spent reviewing and documenting in chart.

## 2020-02-23 ENCOUNTER — Ambulatory Visit (HOSPITAL_COMMUNITY): Payer: Self-pay

## 2020-03-05 ENCOUNTER — Ambulatory Visit (INDEPENDENT_AMBULATORY_CARE_PROVIDER_SITE_OTHER): Payer: Medicaid Other

## 2020-03-05 ENCOUNTER — Other Ambulatory Visit: Payer: Self-pay

## 2020-03-05 VITALS — BP 120/74 | HR 93 | Ht 62.0 in | Wt 198.8 lb

## 2020-03-05 DIAGNOSIS — Z3480 Encounter for supervision of other normal pregnancy, unspecified trimester: Secondary | ICD-10-CM

## 2020-03-05 DIAGNOSIS — Z789 Other specified health status: Secondary | ICD-10-CM | POA: Diagnosis not present

## 2020-03-05 DIAGNOSIS — O3680X0 Pregnancy with inconclusive fetal viability, not applicable or unspecified: Secondary | ICD-10-CM

## 2020-03-05 NOTE — Progress Notes (Signed)
PRENATAL INTAKE SUMMARY  Ms. Hobday presents today New OB Nurse Interview.  OB History    Gravida  3   Para  2   Term  2   Preterm      AB      Living  2     SAB      TAB      Ectopic      Multiple      Live Births  2          I have reviewed the patient's medical, obstetrical, social, and family histories, medications, and available lab results.  SUBJECTIVE She has no unusual complaints  OBJECTIVE Initial Physical Exam (New OB)  GENERAL APPEARANCE: alert, well appearing   ASSESSMENT Normal pregnancy  PLAN Prenatal care to be completed at [facility] All New OB labs to be completed at [facility] provider visit PHQ2 score: 0 GAD 7 score: 2 U/S performed today [redacted]w[redacted]d by CRL single live IUP. FHR 181

## 2020-03-05 NOTE — Progress Notes (Signed)
I have reviewed this chart and agree with the RN/CMA assessment and management.    K. Meryl Murray Durrell, M.D. Attending [facility] (Faculty Practice)   

## 2020-03-14 ENCOUNTER — Ambulatory Visit (INDEPENDENT_AMBULATORY_CARE_PROVIDER_SITE_OTHER): Payer: Medicaid Other | Admitting: Obstetrics and Gynecology

## 2020-03-14 ENCOUNTER — Other Ambulatory Visit (HOSPITAL_COMMUNITY)
Admission: RE | Admit: 2020-03-14 | Discharge: 2020-03-14 | Disposition: A | Discharge status: Home / Self Care | Payer: Medicaid Other | Source: Ambulatory Visit | Attending: Obstetrics and Gynecology | Admitting: Obstetrics and Gynecology

## 2020-03-14 ENCOUNTER — Other Ambulatory Visit: Payer: Self-pay

## 2020-03-14 ENCOUNTER — Encounter: Payer: Self-pay | Admitting: Obstetrics and Gynecology

## 2020-03-14 VITALS — BP 122/80 | HR 96 | Wt 197.6 lb

## 2020-03-14 DIAGNOSIS — Z131 Encounter for screening for diabetes mellitus: Secondary | ICD-10-CM | POA: Diagnosis not present

## 2020-03-14 DIAGNOSIS — Z3480 Encounter for supervision of other normal pregnancy, unspecified trimester: Secondary | ICD-10-CM | POA: Diagnosis not present

## 2020-03-14 DIAGNOSIS — Z3481 Encounter for supervision of other normal pregnancy, first trimester: Secondary | ICD-10-CM

## 2020-03-14 DIAGNOSIS — Z3A11 11 weeks gestation of pregnancy: Secondary | ICD-10-CM

## 2020-03-14 DIAGNOSIS — Z1329 Encounter for screening for other suspected endocrine disorder: Secondary | ICD-10-CM | POA: Diagnosis not present

## 2020-03-14 MED ORDER — PROMETHAZINE HCL 25 MG PO TABS: 25.0000 mg | ORAL_TABLET | Freq: Four times a day (QID) | ORAL | 0 refills | Status: DC | PRN

## 2020-03-14 MED ORDER — ASPIRIN EC 81 MG PO TBEC: 81.0000 mg | DELAYED_RELEASE_TABLET | Freq: Every day | ORAL | 3 refills | Status: DC

## 2020-03-14 NOTE — Progress Notes (Signed)
History:   [name] is a 34 y.o. G3P2002 at 93w2dby LMP being seen today for her first obstetrical visit.  Her obstetrical history is significant for VSD as infant, obesity, migraine with aura.  Patient does intend to breast feed. Pregnancy history fully reviewed. Planned pregnancy.   Denies thyroid disease, although mentioned on her problem list.   Patient reports no complaints.      HISTORY: OB History  Gravida Para Term Preterm AB Living  _0 0 0 2  SAB IAB Ectopic Multiple Live Births  0 0 0 0 2    # Outcome Date GA Lbr Len/2nd Weight Sex Delivery Anes PTL Lv  3 Current           2 Term 12/05/12 440w6d4:35 / 00:06 8 lb 2 oz (3.685 kg) M Vag-Spont None  LIV     Birth Comments: none     Name: Patterson,Toni Yui     Apgar1: 9  Apgar5: 9  1 Term 06/05/06 3977w0d lb 1 oz (3.204 kg) M Vag-Spont None  LIV    Last pap smear was done 2020 and was normal  Past Medical History:  Diagnosis Date  . Depression   . GERD (gastroesophageal reflux disease)   . Hx of percutaneous transcatheter closure of congenital VSD   . Migraine headache with aura   . Panic attack    Past Surgical History:  Procedure Laterality Date  . CARDIAC SURGERY     as a baby  . TONSILLECTOMY     Family History  Problem Relation Age of Onset  . Congestive Heart Failure Other   . Asthma Paternal Aunt   . Congestive Heart Failure Paternal Uncle   . Cancer Paternal Grandmother        breast  . Stroke Paternal Grandfather   . Asthma Cousin   . Healthy Mother   . Hypertension Father   . Diabetes Father    Social History   Tobacco Use  . Smoking status: Former Smoker    Quit date: 08/16/2010    Years since quitting: 9.5  . Smokeless tobacco: Never Used  Vaping Use  . Vaping Use: Never used  Substance Use Topics  . Alcohol use: Not Currently    Comment: last drink drink early September  . Drug use: No   No Known Allergies Current Outpatient Medications on File Prior to Visit   Medication Sig Dispense Refill  . Blood Pressure Monitoring (BLOOD PRESSURE KIT) DEVI 1 kit by Does not apply route as needed. 1 each 0  . Prenatal Vit-Fe Fumarate-FA (MULTIVITAMIN-PRENATAL) 27-0.8 MG TABS tablet Take 1 tablet by mouth daily at 12 noon.    . metFORMIN (GLUCOPHAGE) 1000 MG tablet Take 1/2 tablet by mouth daily for one week. Then increase to one tablet daily for one week.  Then one tablet twice a day. (Patient not taking: Reported on 03/14/2020) 60 tablet 5   No current facility-administered medications on file prior to visit.    Review of Systems Pertinent items noted in HPI and remainder of comprehensive ROS otherwise negative.  Physical Exam:   Vitals:   03/14/20 1022  BP: 122/80  Pulse: 96  Weight: 197 lb 9.6 oz (89.6 kg)   Fetal Heart Rate (bpm): 182 Sono  General: well-developed, well-nourished female in no acute distress  Skin: normal coloration and turgor, no rashes  Neurologic: oriented, normal, negative, normal mood  Extremities: normal strength, tone, and muscle mass, ROM of  all joints is normal  HEENT PERRLA, extraocular movement intact and sclera clear, anicteric  Neck supple and no masses  Cardiovascular: regular rate and rhythm  Respiratory:  no respiratory distress, normal breath sounds  Abdomen: soft, non-tender; bowel sounds normal; no masses,  no organomegaly  Pelvic: normal external genitalia, no lesions, swabs collected without speculum.     Assessment:    Pregnancy: [id number] Patient Active Problem List   Diagnosis Date Noted  . Supervision of other normal pregnancy, antepartum 02/01/2020  . Migraine headache with aura   . GERD 12/01/2006     Plan:    1. Supervision of other normal pregnancy, antepartum  - Genetic Screening - CBC/D/Plt+RPR+Rh+ABO+Rub Ab... - Enroll Patient in Babyscripts - Cervicovaginal ancillary only( [place]) - [place] MFM OB COMP + 14 WK; Future - Hepatitis C Antibody - HgB A1c - Thyroid Panel With TSH -  Culture, OB Urine - Will need Echo after 20 weeks d/t hx of VSD as infant.  - Rx: Phenergan as needed for Nauseas, migraine HA  2. [redacted] weeks gestation of pregnancy  - Culture, OB Urine   Initial labs drawn. Continue prenatal vitamins. Problem list reviewed and updated. Genetic Screening discussed, NIPS: requested. Ultrasound discussed; fetal anatomic survey: requested. Anticipatory guidance about prenatal visits given including labs, ultrasounds, and testing. Discussed usage of Babyscripts and virtual visits as additional source of managing and completing prenatal visits in midst of coronavirus and pandemic.   Encouraged to complete MyChart Registration for her ability to review results, send requests, and have questions addressed.  The nature of [place] for [facility] Healthcare/Faculty Practice with multiple MDs and Advanced Practice Providers was explained to patient; also emphasized that residents, students are part of our team. Routine obstetric precautions reviewed. Encouraged to seek out care at office or emergency room [facility] preferred) for urgent and/or emergent concerns. No follow-ups on file.    [name], Artist Pais, [place] for [employer], [place]

## 2020-03-14 NOTE — Progress Notes (Signed)
Pt presents for NOB visit Flu vaccine offered; pt request to defer till next visit  Nurse NOB intake completed on 03/05/20 Last pap 06/10/2018 - normal

## 2020-03-15 ENCOUNTER — Other Ambulatory Visit: Payer: Self-pay

## 2020-03-15 DIAGNOSIS — N760 Acute vaginitis: Secondary | ICD-10-CM

## 2020-03-15 DIAGNOSIS — B9689 Other specified bacterial agents as the cause of diseases classified elsewhere: Secondary | ICD-10-CM

## 2020-03-15 LAB — HCV INTERPRETATION

## 2020-03-15 LAB — CBC/D/PLT+RPR+RH+ABO+RUB AB...
Antibody Screen: NEGATIVE
Basophils Absolute: 0 10*3/uL (ref 0.0–0.2)
Basos: 0 %
EOS (ABSOLUTE): 0.1 10*3/uL (ref 0.0–0.4)
Eos: 1 %
HCV Ab: <0.1 s/co ratio (ref 0.0–0.9)
HIV Screen 4th Generation wRfx: NONREACTIVE
Hematocrit: 36 % (ref 34.0–46.6)
Hemoglobin: 12.5 g/dL (ref 11.1–15.9)
Hepatitis B Surface Ag: NEGATIVE
Immature Grans (Abs): 0 10*3/uL (ref 0.0–0.1)
Immature Granulocytes: 0 %
Lymphocytes Absolute: 1.5 10*3/uL (ref 0.7–3.1)
Lymphs: 19 %
MCH: 29.1 pg (ref 26.6–33.0)
MCHC: 34.7 g/dL (ref 31.5–35.7)
MCV: 84 fL (ref 79–97)
Monocytes Absolute: 0.6 10*3/uL (ref 0.1–0.9)
Monocytes: 7 %
Neutrophils Absolute: 5.6 10*3/uL (ref 1.4–7.0)
Neutrophils: 73 %
Platelets: 278 10*3/uL (ref 150–450)
RBC: 4.3 x10E6/uL (ref 3.77–5.28)
RDW: 12.9 % (ref 11.7–15.4)
RPR Ser Ql: NONREACTIVE
Rh Factor: POSITIVE
Rubella Antibodies, IGG: 1.48 index (ref >0.99)
WBC: 7.8 10*3/uL (ref 3.4–10.8)

## 2020-03-15 LAB — CERVICOVAGINAL ANCILLARY ONLY
Bacterial Vaginitis (gardnerella): POSITIVE — AB
Chlamydia: NEGATIVE
Comment: NEGATIVE
Comment: NEGATIVE
Comment: NEGATIVE
Comment: NORMAL
Neisseria Gonorrhea: NEGATIVE
Trichomonas: NEGATIVE

## 2020-03-15 LAB — HEMOGLOBIN A1C
Est. average glucose Bld gHb Est-mCnc: 103 mg/dL
Hgb A1c MFr Bld: 5.2 % (ref 4.8–5.6)

## 2020-03-15 LAB — HEPATITIS C ANTIBODY: Hep C Virus Ab: <0.1 s/co ratio (ref 0.0–0.9)

## 2020-03-15 LAB — THYROID PANEL WITH TSH
Free Thyroxine Index: 1.7 (ref 1.2–4.9)
T3 Uptake Ratio: 14 % — ABNORMAL LOW (ref 24–39)
T4, Total: 11.8 ug/dL (ref 4.5–12.0)
TSH: 2.36 u[IU]/mL (ref 0.450–4.500)

## 2020-03-15 MED ORDER — METRONIDAZOLE 500 MG PO TABS: 500.0000 mg | ORAL_TABLET | Freq: Two times a day (BID) | ORAL | 0 refills | Status: DC

## 2020-03-16 ENCOUNTER — Other Ambulatory Visit: Payer: Self-pay | Admitting: Obstetrics and Gynecology

## 2020-03-16 LAB — CULTURE, OB URINE

## 2020-03-16 LAB — URINE CULTURE, OB REFLEX

## 2020-03-21 ENCOUNTER — Telehealth: Payer: Medicaid Other | Admitting: Family

## 2020-03-21 ENCOUNTER — Encounter: Payer: Self-pay | Admitting: Obstetrics and Gynecology

## 2020-03-21 DIAGNOSIS — Z3490 Encounter for supervision of normal pregnancy, unspecified, unspecified trimester: Secondary | ICD-10-CM

## 2020-03-21 DIAGNOSIS — M549 Dorsalgia, unspecified: Secondary | ICD-10-CM

## 2020-03-21 NOTE — Progress Notes (Signed)
Based on what you shared with me, I feel your condition warrants further evaluation and I recommend that you be seen for a face to face office visit.   Given that you are having back pain and you are pregnant you need to be seen face to face.   NOTE: If you entered your credit card information for this eVisit, you will not be charged. You may see a "hold" on your card for the $35 but that hold will drop off and you will not have a charge processed.   If you are having a true medical emergency please call 911.      For an urgent face to face visit, [place] has five urgent care centers for your convenience:     [facility] Urgent Care Center at [facility] Directions [phone] [address] [place], [place] [address] . 10 am - 6pm Monday - Friday    [facility] Urgent [facility] Center [facility]) Get Driving Directions [phone] [address] [place], [place] [address] . 10 am to 8 pm Monday-Friday . 12 pm to 8 pm [facility] Urgent Care at [facility] Get Driving Directions [phone] [address] [place], [place] [address] . 8 am to 8 pm Monday-Friday . 9 am to 6 pm Saturday . 11 am to 6 pm Sunday     [facility] Urgent Care at [facility] Get Driving Directions  [phone] [address] [place], [place] [address] . 8 am to 8 pm Monday-Friday . 8 am to 4 pm [facility] Urgent Care at [facility] Directions [phone] [address] [place], [place] [address] . 12 pm to 6 pm Monday-Friday      Your e-visit answers were reviewed by a board certified advanced clinical practitioner to complete your personal care plan.  Thank you for using e-Visits.

## 2020-03-27 ENCOUNTER — Telehealth: Payer: Self-pay | Admitting: *Deleted

## 2020-03-27 ENCOUNTER — Encounter: Payer: Self-pay | Admitting: Obstetrics and Gynecology

## 2020-03-27 NOTE — Telephone Encounter (Signed)
Spoke with pt regarding Horizon results.  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 Answered what questions I could for pt.  Offered referral for genetic counselor- pt declined at this time.  Advised I would note on next appt so that provider may discuss with pt and give further recommendations.

## 2020-03-28 ENCOUNTER — Telehealth: Payer: Medicaid Other | Admitting: Family

## 2020-03-28 DIAGNOSIS — R0981 Nasal congestion: Secondary | ICD-10-CM

## 2020-03-28 MED ORDER — FLUTICASONE PROPIONATE 50 MCG/ACT NA SUSP: 2.0000 | Freq: Every day | NASAL | 6 refills | Status: DC

## 2020-03-28 NOTE — Progress Notes (Signed)
E-Visit for Corona Virus Screening  Your current symptoms could be consistent with the coronavirus.  Many health care providers can now test patients at their office but not all are.  [place] has multiple testing sites. For information on our COVID testing locations and hours go to [email or web address]  We are enrolling you in our MyChart Home Monitoring for COVID19 . Daily you will receive a questionnaire within the MyChart website. Our COVID 19 response team will be monitoring your responses daily.  Testing Information: The COVID-19 Community Testing sites are testing BY APPOINTMENT ONLY.  You can schedule online at [email or web address]  If you do not have access to a smart phone or computer you may call [phone] for an appointment.   Additional testing sites in the Community:   For [facility] Testing sites in [place]  [email or web address]   For Pop-up testing sites in [place]  [email or web address]   For [facility] [email or web address]   For [facility] testing in [place] and [employer] [email or web address]   For [facility] testing in [place]   [email or web address]  For  more information about community testing call [phone]   Please quarantine yourself while awaiting your test results. Please stay home for a minimum of 10 days from the first day of illness with improving symptoms and you have had 24 hours of no fever (without the use of Tylenol (Acetaminophen) Motrin (Ibuprofen) or any fever reducing medication).  Also - Do not get tested prior to returning to work because once you have had a positive test the test can stay  positive for more than a month in some cases.   You should wear a mask or cloth face covering over your nose and mouth if you must be around other people or animals, including pets (even at home). Try to stay at least 6 feet away from other people. This will protect the people around you.  Please continue good preventive care measures, including:  frequent hand-washing, avoid touching your face, cover coughs/sneezes, stay out of crowds and keep a 6 foot distance from others.  COVID-19 is a respiratory illness with symptoms that are similar to the flu. Symptoms are typically mild to moderate, but there have been cases of severe illness and death due to the virus.   The following symptoms may appear 2-14 days after exposure:  Fever  Cough  Shortness of breath or difficulty breathing  Chills  Repeated shaking with chills  Muscle pain  Headache  Sore throat  New loss of taste or smell  Fatigue  Congestion or runny nose  Nausea or vomiting  Diarrhea  Go to the nearest hospital ED for assessment if fever/cough/breathlessness are severe or illness seems like a threat to life.  It is vitally important that if you feel that you have an infection such as this virus or any other virus that you stay home and away from places where you may spread it to others.  You should avoid contact with people age 40 and older.   You can use medication such as A prescription for Fluticasone nasal spray 2 sprays in each nostril one time per day.   You may also take acetaminophen (Tylenol) as needed for fever.  Reduce your risk of any infection by using the same precautions used for avoiding the common cold or flu:   Wash your hands often with soap and warm water for at least 20 seconds.  If soap and water are not readily available, use an alcohol-based  hand sanitizer with at least 60% alcohol.   If coughing or sneezing, cover your mouth and nose by coughing or sneezing into the elbow areas of your  shirt or coat, into a tissue or into your sleeve (not your hands).  Avoid shaking hands with others and consider head nods or verbal greetings only.  Avoid touching your eyes, nose, or mouth with unwashed hands.   Avoid close contact with people who are sick.  Avoid places or events with large numbers of people in one location, like concerts or sporting events.  Carefully consider travel plans you have or are making.  If you are planning any travel outside or inside the [place], visit the CDC's Travelers' Health webpage for the latest health notices.  If you have some symptoms but not all symptoms, continue to monitor at home and seek medical attention if your symptoms worsen.  If you are having a medical emergency, call 911.  HOME CARE  Only take medications as instructed by your medical team.  Drink plenty of fluids and get plenty of rest.  A steam or ultrasonic humidifier can help if you have congestion.   GET HELP RIGHT AWAY IF YOU HAVE EMERGENCY WARNING SIGNS** FOR COVID-19. If you or someone is showing any of these signs seek emergency medical care immediately. Call 911 or proceed to your closest emergency facility if:  You develop worsening high fever.  Trouble breathing  Bluish lips or face  Persistent pain or pressure in the chest  New confusion  Inability to wake or stay awake  You cough up blood.  Your symptoms become more severe  **This list is not all possible symptoms. Contact your medical provider for any symptoms that are sever or concerning to you.  MAKE SURE YOU   Understand these instructions.  Will watch your condition.  Will get help right away if you are not doing well or get worse.  Your e-visit answers were reviewed by a board certified advanced clinical practitioner to complete your personal care plan.  Depending on the condition, your plan could have included both over the counter or prescription medications.  If there is a problem please  reply once you have received a response from your provider.  Your safety is important to [place].  If you have drug allergies check your prescription carefully.    You can use MyChart to ask questions about today's visit, request a non-urgent call back, or ask for a work or school excuse for 24 hours related to this e-Visit. If it has been greater than 24 hours you will need to follow up with your provider, or enter a new e-Visit to address those concerns. You will get an e-mail in the next two days asking about your experience.  I hope that your e-visit has been valuable and will speed your recovery. Thank you for using e-visits.   Approximately 5 minutes was spent documenting and reviewing patient's chart.

## 2020-04-07 NOTE — L&D Delivery Note (Addendum)
Delivery Note:   [name] [id number] at [redacted]w[redacted]d  Admitting diagnosis: Encounter for elective induction of labor [Z34.90] Risks:  LGA (EFW @[redacted]w[redacted]d  3506g, 97%ile) VSD in patient as baby (fetal echo nml) Velamentous cord insert Intermediate allele for fragile X Elevated BP without diagnosis  First Stage:  Onset of labor: 09/27/20 @ 0300 Augmentation: N/A ROM: SROM @0300  Clear fluid  Active labor onset: 09/27/20 @ 0545 Analgesia /Anesthesia/Pain control intrapartum: None   Second Stage:  Complete dilation at 09/27/2020  0945 Onset of pushing at 09/27/20 @ 0951 FHR second stage Category II. Moderate variability with early decelerations.    [name] pushing in lithotomy position with SNM, CNM and L&D staff support bedside. With great maternal efforts fetal head crowned up to +4 station. [name] and maternal mother present for birth and supportive. Nuchal Cord: No  Delivery of a Live born female  Birth Weight: 8 lb 15.7 oz (4074 g) APGAR: 8, 9  Newborn Delivery   Birth date/time: 09/27/2020 10:06:00 Delivery type: Vaginal, Spontaneous     Infant delivered in cephalic presentation, in DOA position and restituted to ROA position. With maternal pushing efforts anterior shoulder released and remaining fetal body delivered with ease. Vigorously crying infant placed immediately S2S with mom.    Cord double clamped after cessation of pulsation by SNM, cut by FOB "[name]" .  Collection of cord blood for typing completed. Cord blood donation-None  Arterial cord blood sample-No    Third Stage:  Placenta delivered Spontaneously intact with 3 vessels.  Uterine tone firm bleeding minimal  Uterotonics: IV pitocin  Placenta to Pathology .  None  laceration identified.  Episiotomy:None  Local analgesia: n/a  Repair:n/a Est. Blood Loss (mL):128.00   Complications: None   Mom to postpartum.  [name] to Couplet care / Skin to Skin.  Delivery Report:  Review the Delivery  Report for details.     Signed: 09/29/20), BSN, RNC-OB, Student Nurse Midwife 09/27/2020, 11:36 AM    I was present and gloved for the entirety of the delivery. I agree with the findings and documentation in the student Nurse Midwife note.   Royetta Asal, MD [facility] Family Medicine Fellow, [facility] for [facility], [facility]

## 2020-04-11 ENCOUNTER — Other Ambulatory Visit: Payer: Self-pay | Admitting: Obstetrics & Gynecology

## 2020-04-11 ENCOUNTER — Ambulatory Visit (INDEPENDENT_AMBULATORY_CARE_PROVIDER_SITE_OTHER): Payer: Medicaid Other | Admitting: Obstetrics and Gynecology

## 2020-04-11 ENCOUNTER — Other Ambulatory Visit: Payer: Self-pay

## 2020-04-11 ENCOUNTER — Encounter: Payer: Self-pay | Admitting: Obstetrics and Gynecology

## 2020-04-11 VITALS — BP 119/76 | HR 98 | Wt 196.0 lb

## 2020-04-11 DIAGNOSIS — Z3A15 15 weeks gestation of pregnancy: Secondary | ICD-10-CM

## 2020-04-11 DIAGNOSIS — E282 Polycystic ovarian syndrome: Secondary | ICD-10-CM

## 2020-04-11 DIAGNOSIS — Z3480 Encounter for supervision of other normal pregnancy, unspecified trimester: Secondary | ICD-10-CM

## 2020-04-11 NOTE — Progress Notes (Signed)
   PRENATAL VISIT NOTE  Subjective:  [name] is a 35 y.o. G3P2002 at [redacted]w[redacted]d being seen today for ongoing prenatal care.  She is currently monitored for the following issues for this low-risk pregnancy and has GERD; Migraine headache with aura; and Supervision of other normal pregnancy, antepartum on their problem list.  Patient reports no complaints.  Contractions: Not present. Vag. Bleeding: None.   . Denies leaking of fluid.   The following portions of the patient's history were reviewed and updated as appropriate: allergies, current medications, past family history, past medical history, past social history, past surgical history and problem list.   Objective:   Vitals:   04/11/20 1021  BP: 119/76  Pulse: 98  Weight: 196 lb (88.9 kg)    Fetal Status: Fetal Heart Rate (bpm): 169         General:  Alert, oriented and cooperative. Patient is in no acute distress.  Skin: Skin is warm and dry. No rash noted.   Cardiovascular: Normal heart rate noted  Respiratory: Normal respiratory effort, no problems with respiration noted  Abdomen: Soft, gravid, appropriate for gestational age.  Pain/Pressure: Absent     Pelvic: Cervical exam deferred        Extremities: Normal range of motion.  Edema: None  Mental Status: Normal mood and affect. Normal behavior. Normal judgment and thought content.   Assessment and Plan:  Pregnancy: G3P2002 at [redacted]w[redacted]d  1. Supervision of other normal pregnancy, antepartum AFP today  2. [redacted] weeks gestation of pregnancy   Preterm labor symptoms and general obstetric precautions including but not limited to vaginal bleeding, contractions, leaking of fluid and fetal movement were reviewed in detail with the patient. Please refer to After Visit Summary for other counseling recommendations.   Return in about 4 weeks (around 05/09/2020) for low OB, in person.  Future Appointments  Date Time Provider Department Center  05/09/2020  1:45 PM [facility] US5 [facility]  [facility]    Conan Bowens, MD

## 2020-04-13 LAB — AFP, SERUM, OPEN SPINA BIFIDA
AFP MoM: 0.64
AFP Value: 16.1 ng/mL
Gest. Age on Collection Date: 15 weeks
Maternal Age At EDD: 33.6 yr
OSBR Risk 1 IN: 10000
Test Results:: NEGATIVE
Weight: 196 [lb_av]

## 2020-05-04 ENCOUNTER — Other Ambulatory Visit: Payer: Self-pay | Admitting: *Deleted

## 2020-05-04 DIAGNOSIS — Z3480 Encounter for supervision of other normal pregnancy, unspecified trimester: Secondary | ICD-10-CM

## 2020-05-04 NOTE — Progress Notes (Signed)
Order entered for fetal echo per message received from J.Rasch / A.Garner Nash.

## 2020-05-09 ENCOUNTER — Ambulatory Visit (INDEPENDENT_AMBULATORY_CARE_PROVIDER_SITE_OTHER): Payer: Medicaid Other | Admitting: Obstetrics and Gynecology

## 2020-05-09 ENCOUNTER — Other Ambulatory Visit: Payer: Self-pay | Admitting: Obstetrics and Gynecology

## 2020-05-09 ENCOUNTER — Other Ambulatory Visit: Payer: Self-pay

## 2020-05-09 ENCOUNTER — Ambulatory Visit: Payer: Medicaid Other | Attending: Obstetrics and Gynecology

## 2020-05-09 DIAGNOSIS — Z3480 Encounter for supervision of other normal pregnancy, unspecified trimester: Secondary | ICD-10-CM | POA: Insufficient documentation

## 2020-05-09 NOTE — Progress Notes (Signed)
   PRENATAL VISIT NOTE  Subjective:  [name] is a 35 y.o. G3P2002 at [redacted]w[redacted]d being seen today for ongoing prenatal care.  She is currently monitored for the following issues for this low-risk pregnancy and has GERD; Migraine headache with aura; and Supervision of other normal pregnancy, antepartum on their problem list.  Patient reports no complaints.  Contractions: Not present. Vag. Bleeding: None.  Movement: Present. Denies leaking of fluid.   The following portions of the patient's history were reviewed and updated as appropriate: allergies, current medications, past family history, past medical history, past social history, past surgical history and problem list.   Objective:   Vitals:   05/09/20 1123  BP: 128/76  Pulse: 100  Weight: 196 lb 9.6 oz (89.2 kg)    Fetal Status: Fetal Heart Rate (bpm): 147   Movement: Present     General:  Alert, oriented and cooperative. Patient is in no acute distress.  Skin: Skin is warm and dry. No rash noted.   Cardiovascular: Normal heart rate noted  Respiratory: Normal respiratory effort, no problems with respiration noted  Abdomen: Soft, gravid, appropriate for gestational age.  Pain/Pressure: Absent     Pelvic: Cervical exam deferred        Extremities: Normal range of motion.  Edema: None  Mental Status: Normal mood and affect. Normal behavior. Normal judgment and thought content.   Assessment and Plan:  Pregnancy: G3P2002 at [redacted]w[redacted]d   1. Supervision of other normal pregnancy, antepartum  Echo scheduled this month.  Doing well  Reviewed genetic screening again today.    Preterm labor symptoms and general obstetric precautions including but not limited to vaginal bleeding, contractions, leaking of fluid and fetal movement were reviewed in detail with the patient. Please refer to After Visit Summary for other counseling recommendations.   No follow-ups on file.  Future Appointments  Date Time Provider Department Center   05/09/2020  1:45 PM [facility] US[facility]    Venia Carbon, NP

## 2020-05-14 ENCOUNTER — Other Ambulatory Visit: Payer: Self-pay | Admitting: *Deleted

## 2020-05-14 DIAGNOSIS — R638 Other symptoms and signs concerning food and fluid intake: Secondary | ICD-10-CM

## 2020-05-16 ENCOUNTER — Encounter: Payer: Self-pay | Admitting: Obstetrics and Gynecology

## 2020-05-16 DIAGNOSIS — O43129 Velamentous insertion of umbilical cord, unspecified trimester: Secondary | ICD-10-CM | POA: Insufficient documentation

## 2020-05-30 DIAGNOSIS — Q211 Atrial septal defect: Secondary | ICD-10-CM | POA: Diagnosis not present

## 2020-06-06 ENCOUNTER — Other Ambulatory Visit: Payer: Self-pay

## 2020-06-06 ENCOUNTER — Ambulatory Visit (INDEPENDENT_AMBULATORY_CARE_PROVIDER_SITE_OTHER): Payer: Medicaid Other | Admitting: Women's Health

## 2020-06-06 VITALS — BP 122/75 | HR 102 | Wt 199.0 lb

## 2020-06-06 DIAGNOSIS — Q210 Ventricular septal defect: Secondary | ICD-10-CM | POA: Insufficient documentation

## 2020-06-06 DIAGNOSIS — O43122 Velamentous insertion of umbilical cord, second trimester: Secondary | ICD-10-CM

## 2020-06-06 DIAGNOSIS — Z3480 Encounter for supervision of other normal pregnancy, unspecified trimester: Secondary | ICD-10-CM

## 2020-06-06 DIAGNOSIS — Q992 Fragile X chromosome: Secondary | ICD-10-CM | POA: Insufficient documentation

## 2020-06-06 NOTE — Progress Notes (Signed)
Subjective:  [name] is a 35 y.o. G3P2002 at [redacted]w[redacted]d being seen today for ongoing prenatal care.  She is currently monitored for the following issues for this low-risk pregnancy and has GERD; Migraine headache with aura; Supervision of other normal pregnancy, antepartum; Velamentous insertion of umbilical cord; VSD (ventricular septal defect); and Fragile x chromosome on their problem list.  Patient reports no complaints.  Contractions: Not present. Vag. Bleeding: None.  Movement: Present. Denies leaking of fluid.   The following portions of the patient's history were reviewed and updated as appropriate: allergies, current medications, past family history, past medical history, past social history, past surgical history and problem list. Problem list updated.  Objective:   Vitals:   06/06/20 0832  BP: 122/75  Pulse: (!) 102  Weight: 199 lb (90.3 kg)    Fetal Status: Fetal Heart Rate (bpm): 149   Movement: Present     General:  Alert, oriented and cooperative. Patient is in no acute distress.  Skin: Skin is warm and dry. No rash noted.   Cardiovascular: Normal heart rate noted  Respiratory: Normal respiratory effort, no problems with respiration noted  Abdomen: Soft, gravid, appropriate for gestational age. Pain/Pressure: Absent     Pelvic: Vag. Bleeding: None     Cervical exam deferred        Extremities: Normal range of motion.  Edema: None  Mental Status: Normal mood and affect. Normal behavior. Normal judgment and thought content.   Urinalysis:      Assessment and Plan:  Pregnancy: G3P2002 at [redacted]w[redacted]d  1. VSD (ventricular septal defect) -VSD- patient as a baby, will need Echo.  -fetal echo completed last week, pt reports was normal, still awaiting records  2. Supervision of other normal pregnancy, antepartum -discussed contraception in light of MWA and options available to patient -GTT/labs next visit  3. Velamentous insertion of umbilical cord in second  trimester -growth [place] scheduled tomorrow -EFW 90% on 05/09/2020, AFI WNL  4. Fragile x chromosome -pt reports had genetic counseling and understands meaning of genetic test results  Preterm labor symptoms and general obstetric precautions including but not limited to vaginal bleeding, contractions, leaking of fluid and fetal movement were reviewed in detail with the patient. I discussed the assessment and treatment plan with the patient. The patient was provided an opportunity to ask questions and all were answered. The patient agreed with the plan and demonstrated an understanding of the instructions. The patient was advised to call back or seek an in-person office evaluation/go to MAU at [facility] for any urgent or concerning symptoms. Please refer to After Visit Summary for other counseling recommendations.  Return in about 4 weeks (around 07/04/2020) for in-person LOB/GTT/labs.   Shaneen Reeser, Odie Sera, NP

## 2020-06-06 NOTE — Patient Instructions (Addendum)
Maternity Assessment Unit (MAU)  The Maternity Assessment Unit (MAU) is located at the [facility] at [facility]. The address is: [address] [place], [place], [place] [address]. Please see map below for additional directions.    The Maternity Assessment Unit is designed to help you during your pregnancy, and for up to 6 weeks after delivery, with any pregnancy- or postpartum-related emergencies, if you think you are in labor, or if your water has broken. For example, if you experience nausea and vomiting, vaginal bleeding, severe abdominal or pelvic pain, elevated blood pressure or other problems related to your pregnancy or postpartum time, please come to the Maternity Assessment Unit for assistance.       [email or web address] - progestin-only or non-hormonal      Oral Glucose Tolerance Test During Pregnancy Why am I having this test? The oral glucose tolerance test (OGTT) is done to check how your body processes blood sugar (glucose). This is one of several tests used to diagnose diabetes that develops during pregnancy (gestational diabetes mellitus). Gestational diabetes is a short-term form of diabetes that some women develop while they are pregnant. It usually occurs during the second trimester of pregnancy and goes away after delivery. Testing, or screening, for gestational diabetes usually occurs at weeks 24-28 of pregnancy. You may have the OGTT test after having a 1-hour glucose screening test if the results from that test indicate that you may have gestational diabetes. This test may also be needed if:  You have a history of gestational diabetes.  There is a history of giving birth to very large babies or of losing pregnancies (having stillbirths).  You have signs and symptoms of diabetes, such as: ? Changes in your eyesight. ? Tingling or numbness in your hands or feet. ? Changes in hunger, thirst, and urination, and these are not explained  by your pregnancy. What is being tested? This test measures the amount of glucose in your blood at different times during a period of 3 hours. This shows how well your body can process glucose. What kind of sample is taken? Blood samples are required for this test. They are usually collected by inserting a needle into a blood vessel.   How do I prepare for this test?  For 3 days before your test, eat normally. Have plenty of carbohydrate-rich foods.  Follow instructions from your health care provider about: ? Eating or drinking restrictions on the day of the test. You may be asked not to eat or drink anything other than water (to fast) starting 8-10 hours before the test. ? Changing or stopping your regular medicines. Some medicines may interfere with this test. Tell a health care provider about:  All medicines you are taking, including vitamins, herbs, eye drops, creams, and over-the-counter medicines.  Any blood disorders you have.  Any surgeries you have had.  Any medical conditions you have. What happens during the test? First, your blood glucose will be measured. This is referred to as your fasting blood glucose because you fasted before the test. Then, you will drink a glucose solution that contains a certain amount of glucose. Your blood glucose will be measured again 1, 2, and 3 hours after you drink the solution. This test takes about 3 hours to complete. You will need to stay at the testing location during this time. During the testing period:  Do not eat or drink anything other than the glucose solution.  Do not exercise.  Do not use  any products that contain nicotine or tobacco, such as cigarettes, e-cigarettes, and chewing tobacco. These can affect your test results. If you need help quitting, ask your health care provider. The testing procedure may vary among health care providers and hospitals. How are the results reported? Your results will be reported as milligrams of  glucose per deciliter of blood (mg/dL) or millimoles per liter (mmol/L). There is more than one source for screening and diagnosis reference values used to diagnose gestational diabetes. Your health care provider will compare your results to normal values that were established after testing a large group of people (reference values). Reference values may vary among labs and hospitals. For this test (Carpenter-Coustan), reference values are:  Fasting: 95 mg/dL (5.3 mmol/L).  1 hour: 180 mg/dL (32.3 mmol/L).  2 hour: 155 mg/dL (8.6 mmol/L).  3 hour: 140 mg/dL (7.8 mmol/L). What do the results mean? Results below the reference values are considered normal. If two or more of your blood glucose levels are at or above the reference values, you may be diagnosed with gestational diabetes. If only one level is high, your health care provider may suggest repeat testing or other tests to confirm a diagnosis. Talk with your health care provider about what your results mean. Questions to ask your health care provider Ask your health care provider, or the department that is doing the test:  When will my results be ready?  How will I get my results?  What are my treatment options?  What other tests do I need?  What are my next steps? Summary  The oral glucose tolerance test (OGTT) is one of several tests used to diagnose diabetes that develops during pregnancy (gestational diabetes mellitus). Gestational diabetes is a short-term form of diabetes that some women develop while they are pregnant.  You may have the OGTT test after having a 1-hour glucose screening test if the results from that test show that you may have gestational diabetes. You may also have this test if you have any symptoms or risk factors for this type of diabetes.  Talk with your health care provider about what your results mean. This information is not intended to replace advice given to you by your health care provider. Make sure  you discuss any questions you have with your health care provider. Document Revised: 09/01/2019 Document Reviewed: 09/01/2019 Elsevier Patient Education  2021 [employer].

## 2020-06-06 NOTE — Progress Notes (Signed)
Pt reports fetal movement, denies pain.  

## 2020-06-07 ENCOUNTER — Ambulatory Visit: Payer: Medicaid Other | Attending: Obstetrics and Gynecology

## 2020-06-07 ENCOUNTER — Ambulatory Visit: Payer: Medicaid Other | Admitting: *Deleted

## 2020-06-07 ENCOUNTER — Encounter: Payer: Self-pay | Admitting: *Deleted

## 2020-06-07 DIAGNOSIS — O358XX0 Maternal care for other (suspected) fetal abnormality and damage, not applicable or unspecified: Secondary | ICD-10-CM

## 2020-06-07 DIAGNOSIS — O99212 Obesity complicating pregnancy, second trimester: Secondary | ICD-10-CM

## 2020-06-07 DIAGNOSIS — Q992 Fragile X chromosome: Secondary | ICD-10-CM | POA: Insufficient documentation

## 2020-06-07 DIAGNOSIS — R638 Other symptoms and signs concerning food and fluid intake: Secondary | ICD-10-CM | POA: Insufficient documentation

## 2020-06-07 DIAGNOSIS — O43122 Velamentous insertion of umbilical cord, second trimester: Secondary | ICD-10-CM

## 2020-06-07 DIAGNOSIS — E669 Obesity, unspecified: Secondary | ICD-10-CM

## 2020-06-07 DIAGNOSIS — Q210 Ventricular septal defect: Secondary | ICD-10-CM | POA: Insufficient documentation

## 2020-06-07 DIAGNOSIS — Z148 Genetic carrier of other disease: Secondary | ICD-10-CM | POA: Diagnosis not present

## 2020-06-07 DIAGNOSIS — O283 Abnormal ultrasonic finding on antenatal screening of mother: Secondary | ICD-10-CM

## 2020-06-07 DIAGNOSIS — Z3A23 23 weeks gestation of pregnancy: Secondary | ICD-10-CM | POA: Diagnosis not present

## 2020-06-08 ENCOUNTER — Other Ambulatory Visit: Payer: Self-pay | Admitting: *Deleted

## 2020-06-08 DIAGNOSIS — O35EXX0 Maternal care for other (suspected) fetal abnormality and damage, fetal genitourinary anomalies, not applicable or unspecified: Secondary | ICD-10-CM

## 2020-06-08 DIAGNOSIS — O358XX0 Maternal care for other (suspected) fetal abnormality and damage, not applicable or unspecified: Secondary | ICD-10-CM

## 2020-06-25 ENCOUNTER — Telehealth: Payer: Self-pay

## 2020-06-25 NOTE — Telephone Encounter (Addendum)
Pt said due to a heart condition, she usually takes Amoxicillin for precaution before dental visits. Pt has a dentist appt tomorrow.  Consulted with MD, request for Amoxicillin declined.

## 2020-07-04 ENCOUNTER — Other Ambulatory Visit: Payer: Medicaid Other

## 2020-07-04 ENCOUNTER — Other Ambulatory Visit: Payer: Self-pay

## 2020-07-04 ENCOUNTER — Ambulatory Visit (INDEPENDENT_AMBULATORY_CARE_PROVIDER_SITE_OTHER): Payer: Medicaid Other | Admitting: Women's Health

## 2020-07-04 VITALS — BP 125/76 | HR 102 | Wt 202.8 lb

## 2020-07-04 DIAGNOSIS — Z3480 Encounter for supervision of other normal pregnancy, unspecified trimester: Secondary | ICD-10-CM | POA: Diagnosis not present

## 2020-07-04 DIAGNOSIS — Z3A27 27 weeks gestation of pregnancy: Secondary | ICD-10-CM

## 2020-07-04 DIAGNOSIS — Z23 Encounter for immunization: Secondary | ICD-10-CM

## 2020-07-04 DIAGNOSIS — Q210 Ventricular septal defect: Secondary | ICD-10-CM

## 2020-07-04 DIAGNOSIS — K219 Gastro-esophageal reflux disease without esophagitis: Secondary | ICD-10-CM

## 2020-07-04 DIAGNOSIS — Q992 Fragile X chromosome: Secondary | ICD-10-CM

## 2020-07-04 DIAGNOSIS — G43109 Migraine with aura, not intractable, without status migrainosus: Secondary | ICD-10-CM

## 2020-07-04 DIAGNOSIS — O43122 Velamentous insertion of umbilical cord, second trimester: Secondary | ICD-10-CM

## 2020-07-04 NOTE — Addendum Note (Signed)
Addended by: Kennon Portela on: 07/04/2020 09:12 AM   Modules accepted: Orders

## 2020-07-04 NOTE — Progress Notes (Signed)
Subjective:  [name] is a 35 y.o. G3P2002 at [redacted]w[redacted]d being seen today for ongoing prenatal care.  She is currently monitored for the following issues for this low-risk pregnancy and has GERD; Migraine headache with aura; Supervision of other normal pregnancy, antepartum; Velamentous insertion of umbilical cord; VSD (ventricular septal defect); and Fragile x chromosome on their problem list.  Patient reports no complaints.  Contractions: Not present. Vag. Bleeding: None.  Movement: Present. Denies leaking of fluid.   The following portions of the patient's history were reviewed and updated as appropriate: allergies, current medications, past family history, past medical history, past social history, past surgical history and problem list. Problem list updated.  Objective:   Vitals:   07/04/20 0839  BP: 125/76  Pulse: (!) 102  Weight: 202 lb 12.8 oz (92 kg)    Fetal Status: Fetal Heart Rate (bpm): 147   Movement: Present     General:  Alert, oriented and cooperative. Patient is in no acute distress.  Skin: Skin is warm and dry. No rash noted.   Cardiovascular: Normal heart rate noted  Respiratory: Normal respiratory effort, no problems with respiration noted  Abdomen: Soft, gravid, appropriate for gestational age. Pain/Pressure: Present     Pelvic: Vag. Bleeding: None     Cervical exam deferred        Extremities: Normal range of motion.  Edema: None  Mental Status: Normal mood and affect. Normal behavior. Normal judgment and thought content.   Urinalysis:      Assessment and Plan:  Pregnancy: G3P2002 at [redacted]w[redacted]d  1. Supervision of other normal pregnancy, antepartum - CBC - Glucose Tolerance, 2 Hours w/1 Hour - RPR - HIV Antibody (routine testing w rflx) - Tdap today, info given  2. Migraine with aura and without status migrainosus, not intractable - contraception options with MWA previously discussed  3. Gastroesophageal reflux disease, unspecified whether esophagitis  present - no concerns  4. VSD (ventricular septal defect) - patient as a baby, Fetal echo NML  5. Fragile x chromosome - genetic counseling completed  6. Velamentous insertion of umbilical cord in second trimester - growth [place] scheduled tomorrow, last [place] 06/07/2020 EFW 52%  Preterm labor symptoms and general obstetric precautions including but not limited to vaginal bleeding, contractions, leaking of fluid and fetal movement were reviewed in detail with the patient. I discussed the assessment and treatment plan with the patient. The patient was provided an opportunity to ask questions and all were answered. The patient agreed with the plan and demonstrated an understanding of the instructions. The patient was advised to call back or seek an in-person office evaluation/go to MAU at [facility] for any urgent or concerning symptoms. Please refer to After Visit Summary for other counseling recommendations.  Return in about 3 weeks (around 07/25/2020) for in-person LOB/APP OK.   Kimi Kroft, Odie Sera, NP

## 2020-07-04 NOTE — Patient Instructions (Signed)
Maternity Assessment Unit (MAU)  The Maternity Assessment Unit (MAU) is located at the [facility] at [facility]. The address is: [address] [place], [place], [place] [address]. Please see map below for additional directions.    The Maternity Assessment Unit is designed to help you during your pregnancy, and for up to 6 weeks after delivery, with any pregnancy- or postpartum-related emergencies, if you think you are in labor, or if your water has broken. For example, if you experience nausea and vomiting, vaginal bleeding, severe abdominal or pelvic pain, elevated blood pressure or other problems related to your pregnancy or postpartum time, please come to the Maternity Assessment Unit for assistance.        Oral Glucose Tolerance Test During Pregnancy Why am I having this test? The oral glucose tolerance test (OGTT) is done to check how your body processes blood sugar (glucose). This is one of several tests used to diagnose diabetes that develops during pregnancy (gestational diabetes mellitus). Gestational diabetes is a short-term form of diabetes that some women develop while they are pregnant. It usually occurs during the second trimester of pregnancy and goes away after delivery. Testing, or screening, for gestational diabetes usually occurs at weeks 24-28 of pregnancy. You may have the OGTT test after having a 1-hour glucose screening test if the results from that test indicate that you may have gestational diabetes. This test may also be needed if:  You have a history of gestational diabetes.  There is a history of giving birth to very large babies or of losing pregnancies (having stillbirths).  You have signs and symptoms of diabetes, such as: ? Changes in your eyesight. ? Tingling or numbness in your hands or feet. ? Changes in hunger, thirst, and urination, and these are not explained by your pregnancy. What is being tested? This test  measures the amount of glucose in your blood at different times during a period of 3 hours. This shows how well your body can process glucose. What kind of sample is taken? Blood samples are required for this test. They are usually collected by inserting a needle into a blood vessel.   How do I prepare for this test?  For 3 days before your test, eat normally. Have plenty of carbohydrate-rich foods.  Follow instructions from your health care provider about: ? Eating or drinking restrictions on the day of the test. You may be asked not to eat or drink anything other than water (to fast) starting 8-10 hours before the test. ? Changing or stopping your regular medicines. Some medicines may interfere with this test. Tell a health care provider about:  All medicines you are taking, including vitamins, herbs, eye drops, creams, and over-the-counter medicines.  Any blood disorders you have.  Any surgeries you have had.  Any medical conditions you have. What happens during the test? First, your blood glucose will be measured. This is referred to as your fasting blood glucose because you fasted before the test. Then, you will drink a glucose solution that contains a certain amount of glucose. Your blood glucose will be measured again 1, 2, and 3 hours after you drink the solution. This test takes about 3 hours to complete. You will need to stay at the testing location during this time. During the testing period:  Do not eat or drink anything other than the glucose solution.  Do not exercise.  Do not use any products that contain nicotine or tobacco, such as  cigarettes, e-cigarettes, and chewing tobacco. These can affect your test results. If you need help quitting, ask your health care provider. The testing procedure may vary among health care providers and hospitals. How are the results reported? Your results will be reported as milligrams of glucose per deciliter of blood (mg/dL) or millimoles  per liter (mmol/L). There is more than one source for screening and diagnosis reference values used to diagnose gestational diabetes. Your health care provider will compare your results to normal values that were established after testing a large group of people (reference values). Reference values may vary among labs and hospitals. For this test (Carpenter-Coustan), reference values are:  Fasting: 95 mg/dL (5.3 mmol/L).  1 hour: 180 mg/dL (85.4 mmol/L).  2 hour: 155 mg/dL (8.6 mmol/L).  3 hour: 140 mg/dL (7.8 mmol/L). What do the results mean? Results below the reference values are considered normal. If two or more of your blood glucose levels are at or above the reference values, you may be diagnosed with gestational diabetes. If only one level is high, your health care provider may suggest repeat testing or other tests to confirm a diagnosis. Talk with your health care provider about what your results mean. Questions to ask your health care provider Ask your health care provider, or the department that is doing the test:  When will my results be ready?  How will I get my results?  What are my treatment options?  What other tests do I need?  What are my next steps? Summary  The oral glucose tolerance test (OGTT) is one of several tests used to diagnose diabetes that develops during pregnancy (gestational diabetes mellitus). Gestational diabetes is a short-term form of diabetes that some women develop while they are pregnant.  You may have the OGTT test after having a 1-hour glucose screening test if the results from that test show that you may have gestational diabetes. You may also have this test if you have any symptoms or risk factors for this type of diabetes.  Talk with your health care provider about what your results mean. This information is not intended to replace advice given to you by your health care provider. Make sure you discuss any questions you have with your health  care provider. Document Revised: 09/01/2019 Document Reviewed: 09/01/2019 Elsevier Patient Education  2021 Elsevier Inc.       Tdap (Tetanus, Diphtheria, Pertussis) Vaccine: What You Need to Know 1. Why get vaccinated? Tdap vaccine can prevent tetanus, diphtheria, and pertussis. Diphtheria and pertussis spread from person to person. Tetanus enters the body through cuts or wounds.  TETANUS (T) causes painful stiffening of the muscles. Tetanus can lead to serious health problems, including being unable to open the mouth, having trouble swallowing and breathing, or death.  DIPHTHERIA (D) can lead to difficulty breathing, heart failure, paralysis, or death.  PERTUSSIS (aP), also known as "whooping cough," can cause uncontrollable, violent coughing that makes it hard to breathe, eat, or drink. Pertussis can be extremely serious especially in babies and young children, causing pneumonia, convulsions, brain damage, or death. In teens and adults, it can cause weight loss, loss of bladder control, passing out, and rib fractures from severe coughing. 2. Tdap vaccine Tdap is only for children 7 years and older, adolescents, and adults.  Adolescents should receive a single dose of Tdap, preferably at age 5 or 12 years. Pregnant people should get a dose of Tdap during every pregnancy, preferably during the early part of the third trimester,  to help protect the newborn from pertussis. Infants are most at risk for severe, life-threatening complications from pertussis. Adults who have never received Tdap should get a dose of Tdap. Also, adults should receive a booster dose of either Tdap or Td (a different vaccine that protects against tetanus and diphtheria but not pertussis) every 10 years, or after 5 years in the case of a severe or dirty wound or burn. Tdap may be given at the same time as other vaccines. 3. Talk with your health care provider Tell your vaccine provider if the person getting the  vaccine:  Has had an allergic reaction after a previous dose of any vaccine that protects against tetanus, diphtheria, or pertussis, or has any severe, life-threatening allergies  Has had a coma, decreased level of consciousness, or prolonged seizures within 7 days after a previous dose of any pertussis vaccine (DTP, DTaP, or Tdap)  Has seizures or another nervous system problem  Has ever had Guillain-Barr Syndrome (also called "GBS")  Has had severe pain or swelling after a previous dose of any vaccine that protects against tetanus or diphtheria In some cases, your health care provider may decide to postpone Tdap vaccination until a future visit. People with minor illnesses, such as a cold, may be vaccinated. People who are moderately or severely ill should usually wait until they recover before getting Tdap vaccine.  Your health care provider can give you more information. 4. Risks of a vaccine reaction  Pain, redness, or swelling where the shot was given, mild fever, headache, feeling tired, and nausea, vomiting, diarrhea, or stomachache sometimes happen after Tdap vaccination. People sometimes faint after medical procedures, including vaccination. Tell your provider if you feel dizzy or have vision changes or ringing in the ears.  As with any medicine, there is a very remote chance of a vaccine causing a severe allergic reaction, other serious injury, or death. 5. What if there is a serious problem? An allergic reaction could occur after the vaccinated person leaves the clinic. If you see signs of a severe allergic reaction (hives, swelling of the face and throat, difficulty breathing, a fast heartbeat, dizziness, or weakness), call 9-1-1 and get the person to the nearest hospital. For other signs that concern you, call your health care provider.  Adverse reactions should be reported to the Vaccine Adverse Event Reporting System (VAERS). Your health care provider will usually file this  report, or you can do it yourself. Visit the VAERS website at www.vaers.LAgents.no or call (534)087-3983. VAERS is only for reporting reactions, and VAERS staff members do not give medical advice. 6. The National Vaccine Injury Compensation Program The [employer] Vaccine Injury Compensation Program (VICP) is a federal program that was created to compensate people who may have been injured by certain vaccines. Claims regarding alleged injury or death due to vaccination have a time limit for filing, which may be as short as two years. Visit the VICP website at SpiritualWord.at or call 719-425-8830 to learn about the program and about filing a claim. 7. How can I learn more?  Ask your health care provider.  Call your local or state health department.  Visit the website of the Food and Drug Administration (FDA) for vaccine package inserts and additional information at FinderList.no.  Contact the Centers for Disease Control and Prevention (CDC): ? Call (367)863-8540 (1-800-CDC-INFO) or ? Visit CDC's website at PicCapture.uy. Vaccine Information Statement Tdap (Tetanus, Diphtheria, Pertussis) Vaccine (11/11/2019) This information is not intended to replace advice given to you  by your health care provider. Make sure you discuss any questions you have with your health care provider. Document Revised: 12/07/2019 Document Reviewed: 12/07/2019 Elsevier Patient Education  2021 Elsevier Inc.         Preterm Labor The normal length of a pregnancy is 39-41 weeks. Preterm labor is when labor starts before 37 completed weeks of pregnancy. Babies who are born prematurely and survive may not be fully developed and may be at an increased risk for long-term problems such as cerebral palsy, developmental delays, and vision and hearing problems. Babies who are born too early may have problems soon after birth. Problems may include regulating blood sugar,  body temperature, heart rate, and breathing rate. These babies often have trouble with feeding. The risk of having problems is highest for babies who are born before 34 weeks of pregnancy. What are the causes? The exact cause of this condition is not known. What increases the risk? You are more likely to have preterm labor if you have certain risk factors that relate to your medical history, problems with present and past pregnancies, and lifestyle factors. Medical history  You have abnormalities of the uterus, including a short cervix.  You have STIs (sexually transmitted infections), or other infections of the urinary tract and the vagina.  You have chronic illnesses, such as blood clotting problems, diabetes, or high blood pressure.  You are overweight or underweight. Present and past pregnancies  You have had preterm labor before.  You are pregnant with twins or other multiples.  You have been diagnosed with a condition in which the placenta covers your cervix (placenta previa).  You waited less than 6 months between giving birth and becoming pregnant again.  Your unborn baby has some abnormalities.  You have vaginal bleeding during pregnancy.  You became pregnant through in vitro fertilization (IVF). Lifestyle and environmental factors  You use tobacco products.  You drink alcohol.  You use street drugs.  You have stress and no social support.  You experience domestic violence.  You are exposed to certain chemicals or environmental pollutants. Other factors  You are younger than age 39 or older than age 32. What are the signs or symptoms? Symptoms of this condition include:  Cramps similar to those that can happen during a menstrual period. The cramps may happen with diarrhea.  Pain in the abdomen or lower back.  Regular contractions that may feel like tightening of the abdomen.  A feeling of increased pressure in the pelvis.  Increased watery or bloody  mucus discharge from the vagina.  Water breaking (ruptured amniotic sac). How is this diagnosed? This condition is diagnosed based on:  Your medical history and a physical exam.  A pelvic exam.  An ultrasound.  Monitoring your uterus for contractions.  Other tests, including: ? A swab of the cervix to check for a chemical called fetal fibronectin. ? Urine tests. How is this treated? Treatment for this condition depends on the length of your pregnancy, your condition, and the health of your baby. Treatment may include:  Taking medicines, such as: ? Hormone medicines. These may be given early in pregnancy to help support the pregnancy. ? Medicines to stop contractions. ? Medicines to help mature the baby's lungs. These may be prescribed if the risk of delivery is high. ? Medicines to prevent your baby from developing cerebral palsy.  Bed rest. If the labor happens before 34 weeks of pregnancy, you may need to stay in the hospital.  Delivery of  the baby. Follow these instructions at home:  Do not use any products that contain nicotine or tobacco, such as cigarettes, e-cigarettes, and chewing tobacco. If you need help quitting, ask your health care provider.  Do not drink alcohol.  Take over-the-counter and prescription medicines only as told by your health care provider.  Rest as told by your health care provider.  Return to your normal activities as told by your health care provider. Ask your health care provider what activities are safe for you.  Keep all follow-up visits as told by your health care provider. This is important.   How is this prevented? To increase your chance of having a full-term pregnancy:  Do not use street drugs or medicines that have not been prescribed to you during your pregnancy.  Talk with your health care provider before taking any herbal supplements, even if you have been taking them regularly.  Make sure you gain a healthy amount of weight  during your pregnancy.  Watch for infection. If you think that you might have an infection, get it checked right away. Symptoms of infection may include: ? Fever. ? Abnormal vaginal discharge or discharge that smells bad. ? Pain or burning with urination. ? Needing to urinate urgently. ? Frequently urinating or passing small amounts of urine frequently. ? Blood in your urine. ? Urine that smells bad or unusual.  Tell your health care provider if you have had preterm labor before. Contact a health care provider if:  You think you are going into preterm labor.  You have signs or symptoms of preterm labor.  You have symptoms of infection. Get help right away if:  You are having regular, painful contractions every 5 minutes or less.  Your water breaks. Summary  Preterm labor is labor that starts before you reach 37 weeks of pregnancy.  Delivering your baby early increases your baby's risk of developing lifelong problems.  The exact cause of preterm labor is unknown. However, having an abnormal uterus, an STI (sexually transmitted infection), or vaginal bleeding during pregnancy increases your risk for preterm labor.  Keep all follow-up visits as told by your health care provider. This is important.  Contact a health care provider if you have signs or symptoms of preterm labor. This information is not intended to replace advice given to you by your health care provider. Make sure you discuss any questions you have with your health care provider. Document Revised: 04/26/2019 Document Reviewed: 04/26/2019 Elsevier Patient Education  2021 [employer].

## 2020-07-04 NOTE — Progress Notes (Signed)
Pt reports fetal movement with occasional pressure.  

## 2020-07-05 ENCOUNTER — Ambulatory Visit: Payer: Medicaid Other | Attending: Obstetrics and Gynecology

## 2020-07-05 ENCOUNTER — Other Ambulatory Visit: Payer: Self-pay | Admitting: *Deleted

## 2020-07-05 ENCOUNTER — Encounter: Payer: Self-pay | Admitting: *Deleted

## 2020-07-05 ENCOUNTER — Ambulatory Visit: Payer: Medicaid Other | Admitting: *Deleted

## 2020-07-05 DIAGNOSIS — Q210 Ventricular septal defect: Secondary | ICD-10-CM | POA: Diagnosis present

## 2020-07-05 DIAGNOSIS — Z87798 Personal history of other (corrected) congenital malformations: Secondary | ICD-10-CM

## 2020-07-05 DIAGNOSIS — Z3A27 27 weeks gestation of pregnancy: Secondary | ICD-10-CM

## 2020-07-05 DIAGNOSIS — O99212 Obesity complicating pregnancy, second trimester: Secondary | ICD-10-CM | POA: Diagnosis not present

## 2020-07-05 DIAGNOSIS — O358XX0 Maternal care for other (suspected) fetal abnormality and damage, not applicable or unspecified: Secondary | ICD-10-CM

## 2020-07-05 DIAGNOSIS — Z148 Genetic carrier of other disease: Secondary | ICD-10-CM

## 2020-07-05 DIAGNOSIS — E669 Obesity, unspecified: Secondary | ICD-10-CM

## 2020-07-05 DIAGNOSIS — O35EXX0 Maternal care for other (suspected) fetal abnormality and damage, fetal genitourinary anomalies, not applicable or unspecified: Secondary | ICD-10-CM

## 2020-07-05 DIAGNOSIS — O43123 Velamentous insertion of umbilical cord, third trimester: Secondary | ICD-10-CM | POA: Diagnosis not present

## 2020-07-05 DIAGNOSIS — Q992 Fragile X chromosome: Secondary | ICD-10-CM | POA: Insufficient documentation

## 2020-07-05 LAB — CBC
Hematocrit: 32.6 % — ABNORMAL LOW (ref 34.0–46.6)
Hemoglobin: 11.1 g/dL (ref 11.1–15.9)
MCH: 28.1 pg (ref 26.6–33.0)
MCHC: 34 g/dL (ref 31.5–35.7)
MCV: 83 fL (ref 79–97)
Platelets: 262 10*3/uL (ref 150–450)
RBC: 3.95 x10E6/uL (ref 3.77–5.28)
RDW: 13.3 % (ref 11.7–15.4)
WBC: 9.4 10*3/uL (ref 3.4–10.8)

## 2020-07-05 LAB — HIV ANTIBODY (ROUTINE TESTING W REFLEX): HIV Screen 4th Generation wRfx: NONREACTIVE

## 2020-07-05 LAB — GLUCOSE TOLERANCE, 2 HOURS W/ 1HR
Glucose, 1 hour: 149 mg/dL (ref 65–179)
Glucose, 2 hour: 112 mg/dL (ref 65–152)
Glucose, Fasting: 84 mg/dL (ref 65–91)

## 2020-07-05 LAB — RPR: RPR Ser Ql: NONREACTIVE

## 2020-07-25 ENCOUNTER — Other Ambulatory Visit: Payer: Self-pay

## 2020-07-25 ENCOUNTER — Ambulatory Visit (INDEPENDENT_AMBULATORY_CARE_PROVIDER_SITE_OTHER): Payer: Medicaid Other

## 2020-07-25 VITALS — BP 124/82 | HR 103 | Wt 203.0 lb

## 2020-07-25 DIAGNOSIS — Z3A30 30 weeks gestation of pregnancy: Secondary | ICD-10-CM

## 2020-07-25 DIAGNOSIS — Z3480 Encounter for supervision of other normal pregnancy, unspecified trimester: Secondary | ICD-10-CM

## 2020-07-25 NOTE — Progress Notes (Signed)
   LOW-RISK PREGNANCY OFFICE VISIT  Patient name: [name] MRN [id number]  Date of birth: 09-16-1985 Chief Complaint:   Routine Prenatal Visit  Subjective:   [name] is a 35 y.o. G36P2002 female at [redacted]w[redacted]d with an Estimated Date of Delivery: 10/03/20 being seen today for ongoing management of a Low-risk pregnancy aeb has GERD; Migraine headache with aura; Supervision of other normal pregnancy, antepartum; Velamentous insertion of umbilical cord; VSD (ventricular septal defect); and Fragile x chromosome on their problem list.  Patient presents today with complaint of pelvic pressure.  Patient endorses fetal movement. Patient denies abdominal cramping or contractions. Patient denies vaginal concerns including abnormal discharge, leaking of fluid, and bleeding.  Contractions: Irritability. Vag. Bleeding: None.  Movement: Present.  Reviewed past medical,surgical, social, obstetrical and family history as well as problem list, medications and allergies.  Objective   Vitals:   07/25/20 0858  BP: 124/82  Pulse: (!) 103  Weight: 203 lb (92.1 kg)  Body mass index is 37.13 kg/m.  Total Weight Gain:4 lb (1.814 kg)         Physical Examination:   General appearance: Well appearing, and in no distress  Mental status: Alert, oriented to person, place, and time  Skin: Warm & dry  Cardiovascular: Normal heart rate noted  Respiratory: Normal respiratory effort, no distress  Abdomen: Soft, gravid, nontender, AGA with Fundal Height: 31 cm  Pelvic: Cervical exam deferred           Extremities: Edema: None  Fetal Status: Fetal Heart Rate (bpm): 152  Movement: Present   No results found for this or any previous visit (from the past 24 hour(s)).  Assessment & Plan:  Low-risk pregnancy of a 35 y.o., G3P2002 at [redacted]w[redacted]d with an Estimated Date of Delivery: 10/03/20   1. Supervision of other normal pregnancy, antepartum -Anticipatory guidance for upcoming appts. -Patient to next appt in 2  weeks for an in-person visit.  2. [redacted] weeks gestation of pregnancy -Doing well. -Reviewed eIOL and informed that many factors such as underlying conditions and cervical exam would play significant role in time.  Otherwise plan for late 40 week induction.  -Patient reports she has never had her fundus measured! -Informed that it should be measured at every visit and if not she should address with the provider.    Meds: No orders of the defined types were placed in this encounter.  Labs/procedures today:  Lab Orders  No laboratory test(s) ordered today     Reviewed: Preterm labor symptoms and general obstetric precautions including but not limited to vaginal bleeding, contractions, leaking of fluid and fetal movement were reviewed in detail with the patient.  All questions were answered.  Follow-up: Return in about 2 weeks (around 08/08/2020) for LROB.  No orders of the defined types were placed in this encounter.  Cherre Robins MSN, CNM 07/25/2020

## 2020-07-25 NOTE — Patient Instructions (Signed)
Labor Induction Labor induction is when steps are taken to cause a pregnant woman to begin the labor process. Most women go into labor on their own between 37 weeks and 42 weeks of pregnancy. When this does not happen, or when there is a medical need for labor to begin, steps may be taken to induce, or bring on, labor. Labor induction causes a pregnant woman's uterus to contract. It also causes the cervix to soften (ripen), open (dilate), and thin out. Usually, labor is not induced before 39 weeks of pregnancy unless there is a medical reason to do so. When is labor induction considered? Labor induction may be right for you if:  Your pregnancy lasts longer than 41 to 42 weeks.  Your placenta is separating from your uterus (placental abruption).  You have a rupture of membranes and your labor does not begin.  You have health problems, like diabetes or high blood pressure (preeclampsia) during your pregnancy.  Your baby has stopped growing or does not have enough amniotic fluid. Before labor induction begins, your health care provider will consider the following factors:  Your medical condition and the baby's condition.  How many weeks you have been pregnant.  How mature the baby's lungs are.  The condition of your cervix.  The position of the baby.  The size of your birth canal. Tell a health care provider about:  Any allergies you have.  All medicines you are taking, including vitamins, herbs, eye drops, creams, and over-the-counter medicines.  Any problems you or your family members have had with anesthetic medicines.  Any surgeries you have had.  Any blood disorders you have.  Any medical conditions you have. What are the risks? Generally, this is a safe procedure. However, problems may occur, including:  Failed induction.  Changes in fetal heart rate, such as being too high, too low, or irregular (erratic).  Infection in the mother or the baby.  Increased risk of  having a cesarean delivery.  Breaking off (abruption) of the placenta from the uterus. This is rare.  Rupture of the uterus. This is very rare.  Your baby could fail to get enough blood flow or oxygen. This can be life-threatening. When induction is needed for medical reasons, the benefits generally outweigh the risks. What happens during the procedure? During the procedure, your health care provider will use one of these methods to induce labor:  Stripping the membranes. In this method, the amniotic sac tissue is gently separated from the cervix. This causes the following to happen: ? Your cervix stretches, which in turn causes the release of prostaglandins. ? Prostaglandins induce labor and cause the uterus to contract. ? This procedure is often done in an office visit. You will be sent home to wait for contractions to begin.  Prostaglandin medicine. This medicine starts contractions and causes the cervix to dilate and ripen. This can be taken by mouth (orally) or by being inserted into the vagina (suppository).  Inserting a small, thin tube (catheter) with a balloon into the vagina and then expanding the balloon with water to dilate the cervix.  Breaking the water. In this method, a small instrument is used to make a small hole in the amniotic sac. This eventually causes the amniotic sac to break. Contractions should begin within a few hours.  Medicine to trigger or strengthen contractions. This medicine is given through an IV that is inserted into a vein in your arm. This procedure may vary among health care providers and hospitals.     Where to find more information  [employer]: www.marchofdimes.org  The American College of Obstetricians and Gynecologists: www.acog.org Summary  Labor induction causes a pregnant woman's uterus to contract. It also causes the cervix to soften (ripen), open (dilate), and thin out.  Labor is usually not induced before 39 weeks of pregnancy unless  there is a medical reason to do so.  When induction is needed for medical reasons, the benefits generally outweigh the risks.  Talk with your health care provider about which methods of labor induction are right for you. This information is not intended to replace advice given to you by your health care provider. Make sure you discuss any questions you have with your health care provider. Document Revised: 01/05/2020 Document Reviewed: 01/05/2020 Elsevier Patient Education  2021 Elsevier Inc.  

## 2020-07-25 NOTE — Progress Notes (Signed)
ROB 30w 2 hr GTT was WNL on 07/04/20  NA:TFTD

## 2020-08-02 ENCOUNTER — Ambulatory Visit: Payer: Medicaid Other | Admitting: *Deleted

## 2020-08-02 ENCOUNTER — Encounter: Payer: Self-pay | Admitting: *Deleted

## 2020-08-02 ENCOUNTER — Ambulatory Visit: Payer: Medicaid Other | Attending: Obstetrics and Gynecology

## 2020-08-02 ENCOUNTER — Other Ambulatory Visit: Payer: Self-pay

## 2020-08-02 DIAGNOSIS — Q210 Ventricular septal defect: Secondary | ICD-10-CM | POA: Diagnosis present

## 2020-08-02 DIAGNOSIS — O99213 Obesity complicating pregnancy, third trimester: Secondary | ICD-10-CM

## 2020-08-02 DIAGNOSIS — O43123 Velamentous insertion of umbilical cord, third trimester: Secondary | ICD-10-CM

## 2020-08-02 DIAGNOSIS — O358XX0 Maternal care for other (suspected) fetal abnormality and damage, not applicable or unspecified: Secondary | ICD-10-CM | POA: Insufficient documentation

## 2020-08-02 DIAGNOSIS — Z87798 Personal history of other (corrected) congenital malformations: Secondary | ICD-10-CM | POA: Diagnosis not present

## 2020-08-02 DIAGNOSIS — Q992 Fragile X chromosome: Secondary | ICD-10-CM

## 2020-08-02 DIAGNOSIS — E669 Obesity, unspecified: Secondary | ICD-10-CM | POA: Diagnosis not present

## 2020-08-02 DIAGNOSIS — O35EXX0 Maternal care for other (suspected) fetal abnormality and damage, fetal genitourinary anomalies, not applicable or unspecified: Secondary | ICD-10-CM

## 2020-08-02 DIAGNOSIS — Z148 Genetic carrier of other disease: Secondary | ICD-10-CM | POA: Diagnosis not present

## 2020-08-02 DIAGNOSIS — Z3A31 31 weeks gestation of pregnancy: Secondary | ICD-10-CM | POA: Diagnosis not present

## 2020-08-03 ENCOUNTER — Other Ambulatory Visit: Payer: Self-pay | Admitting: *Deleted

## 2020-08-03 DIAGNOSIS — O43123 Velamentous insertion of umbilical cord, third trimester: Secondary | ICD-10-CM

## 2020-08-09 ENCOUNTER — Ambulatory Visit (INDEPENDENT_AMBULATORY_CARE_PROVIDER_SITE_OTHER): Payer: Medicaid Other | Admitting: Nurse Practitioner

## 2020-08-09 ENCOUNTER — Other Ambulatory Visit: Payer: Self-pay

## 2020-08-09 ENCOUNTER — Encounter: Payer: Self-pay | Admitting: Nurse Practitioner

## 2020-08-09 VITALS — BP 128/84 | HR 107 | Wt 206.0 lb

## 2020-08-09 DIAGNOSIS — Z3A32 32 weeks gestation of pregnancy: Secondary | ICD-10-CM

## 2020-08-09 DIAGNOSIS — Z3480 Encounter for supervision of other normal pregnancy, unspecified trimester: Secondary | ICD-10-CM

## 2020-08-09 DIAGNOSIS — G43109 Migraine with aura, not intractable, without status migrainosus: Secondary | ICD-10-CM

## 2020-08-09 DIAGNOSIS — K219 Gastro-esophageal reflux disease without esophagitis: Secondary | ICD-10-CM

## 2020-08-09 NOTE — Progress Notes (Signed)
Pt presents for ROB reports bilateral inner thigh pressure.

## 2020-08-09 NOTE — Progress Notes (Signed)
    Subjective:  [name] is a 35 y.o. G3P2002 at [redacted]w[redacted]d being seen today for ongoing prenatal care.  She is currently monitored for the following issues for this low-risk pregnancy and has GERD; Migraine headache with aura; Supervision of other normal pregnancy, antepartum; Velamentous insertion of umbilical cord; VSD (ventricular septal defect); and Fragile x chromosome on their problem list.  Patient reports having pain and stiffness in upper, inner thighs especially when sitting.  Contractions: Not present. Vag. Bleeding: None.  Movement: Present. Denies leaking of fluid.   The following portions of the patient's history were reviewed and updated as appropriate: allergies, current medications, past family history, past medical history, past social history, past surgical history and problem list. Problem list updated.  Objective:   Vitals:   08/09/20 0926  BP: 128/84  Pulse: (!) 107  Weight: 206 lb (93.4 kg)    Fetal Status: Fetal Heart Rate (bpm): 146 Fundal Height: 32 cm Movement: Present     General:  Alert, oriented and cooperative. Patient is in no acute distress.  Skin: Skin is warm and dry. No rash noted.   Cardiovascular: Normal heart rate noted  Respiratory: Normal respiratory effort, no problems with respiration noted  Abdomen: Soft, gravid, appropriate for gestational age. Pain/Pressure: Present     Pelvic:  Cervical exam deferred        Extremities: Normal range of motion.  Edema: None  Mental Status: Normal mood and affect. Normal behavior. Normal judgment and thought content.   Urinalysis:      Assessment and Plan:  Pregnancy: G3P2002 at [redacted]w[redacted]d  1. Supervision of other normal pregnancy, antepartum Doing well Reviewed some stretches she can do to not be as stiff when sitting for a period of time.  2. Gastroesophageal reflux disease, unspecified whether esophagitis present Taking Tums and they keep her comfortable  3. Migraine with aura and without status  migrainosus, not intractable Headaches have decreased and are infrequent. Sleep usually helps when she has one.   Preterm labor symptoms and general obstetric precautions including but not limited to vaginal bleeding, contractions, leaking of fluid and fetal movement were reviewed in detail with the patient. Please refer to After Visit Summary for other counseling recommendations.  Return in about 2 weeks (around 08/23/2020) for in person ROB.  Nolene Bernheim, RN, MSN, NP-BC Nurse Practitioner, [facility] for [employer], [facility] 08/09/2020 3:31 PM

## 2020-08-13 ENCOUNTER — Telehealth: Payer: Medicaid Other | Admitting: Physician Assistant

## 2020-08-13 DIAGNOSIS — R21 Rash and other nonspecific skin eruption: Secondary | ICD-10-CM

## 2020-08-13 NOTE — Progress Notes (Signed)
For the safety of you and your child, I recommend a face to face office visit with a health care provider.  Many mothers need to take medicines during their pregnancy and while nursing.  Almost all medicines pass into the breast milk in small quantities.  Most are generally considered safe for a mother to take but some medicines must be avoided.  After reviewing your E-Visit request, I recommend that you consult your OB/GYN or pediatrician for medical advice in relation to your condition and prescription medications while pregnant or breastfeeding. NOTE: If you entered your credit card information for this eVisit, you will not be charged. You may see a "hold" on your card for the $35 but that hold will drop off and you will not have a charge processed.  If you are having a true medical emergency please call 911.    For an urgent face to face visit, [facility] has six urgent care centers for your convenience:     [facility] [place] Get Driving Directions [phone] [address] [place], [place] [address] . 8 am - 4 pm Monday - Friday    [facility] ([place]) Get Driving Directions [phone] [address] [place], [place] [address] . 8 am to 8 pm Monday-Friday . 10 am to 6 pm Saturday-Sunday  [facility] ([place]) Get Driving Directions [phone]  [address] ,  [place]  [address] . 8 am to 8 pm Monday-Friday . 8 am to 4 pm Saturday-Sunday  [facility] Get Driving Directions [phone] [address], [address] [place], [place] [address] . 8 am to 8 pm Monday-Friday . 8 am to 4 pm Saturday-Sunday   [facility] [place] Get Driving Directions  [phone] [address] [place], [place] [address] . 8 am to 8 pm Monday-Friday . 8 am to 4 pm Saturday-Sunday   [facility] [place] Get Driving Directions [phone] [address] [place], [place] [address] . 8 am to 8 pm Monday-Friday . 8 am to 4 pm Saturday-Sunday     Your MyChart E-visit questionnaire answers were reviewed by a board certified advanced clinical practitioner to complete your personal care plan based on your specific symptoms.  Thank you for using e-Visits.   I provided 5 minutes of non face-to-face time during this encounter for chart review and documentation.  

## 2020-08-23 ENCOUNTER — Ambulatory Visit (INDEPENDENT_AMBULATORY_CARE_PROVIDER_SITE_OTHER): Payer: Medicaid Other | Admitting: Obstetrics and Gynecology

## 2020-08-23 ENCOUNTER — Encounter: Payer: Self-pay | Admitting: Obstetrics and Gynecology

## 2020-08-23 ENCOUNTER — Other Ambulatory Visit: Payer: Self-pay

## 2020-08-23 VITALS — BP 118/78 | HR 109 | Wt 208.7 lb

## 2020-08-23 DIAGNOSIS — Z3480 Encounter for supervision of other normal pregnancy, unspecified trimester: Secondary | ICD-10-CM

## 2020-08-23 DIAGNOSIS — O283 Abnormal ultrasonic finding on antenatal screening of mother: Secondary | ICD-10-CM

## 2020-08-23 DIAGNOSIS — O43123 Velamentous insertion of umbilical cord, third trimester: Secondary | ICD-10-CM

## 2020-08-23 DIAGNOSIS — Q210 Ventricular septal defect: Secondary | ICD-10-CM

## 2020-08-23 DIAGNOSIS — Q992 Fragile X chromosome: Secondary | ICD-10-CM

## 2020-08-23 NOTE — Patient Instructions (Signed)

## 2020-08-23 NOTE — Progress Notes (Signed)
Pt reports fetal movement with some irritability and ligament pain.

## 2020-08-23 NOTE — Progress Notes (Signed)
Subjective:  [name] is a 35 y.o. G3P2002 at [redacted]w[redacted]d being seen today for ongoing prenatal care.  She is currently monitored for the following issues for this high-risk pregnancy and has GERD; Abnormal pregnancy [place]; Migraine headache with aura; Supervision of other normal pregnancy, antepartum; Velamentous insertion of umbilical cord; VSD (ventricular septal defect); and Fragile x chromosome on their problem list.  Patient reports general discomforts of pregnancy.  Contractions: Irritability. Vag. Bleeding: None.  Movement: Present. Denies leaking of fluid.   The following portions of the patient's history were reviewed and updated as appropriate: allergies, current medications, past family history, past medical history, past social history, past surgical history and problem list. Problem list updated.  Objective:   Vitals:   08/23/20 1405  BP: 118/78  Pulse: (!) 109  Weight: 208 lb 11.2 oz (94.7 kg)    Fetal Status: Fetal Heart Rate (bpm): 138   Movement: Present     General:  Alert, oriented and cooperative. Patient is in no acute distress.  Skin: Skin is warm and dry. No rash noted.   Cardiovascular: Normal heart rate noted  Respiratory: Normal respiratory effort, no problems with respiration noted  Abdomen: Soft, gravid, appropriate for gestational age. Pain/Pressure: Present     Pelvic:  Cervical exam deferred        Extremities: Normal range of motion.  Edema: None  Mental Status: Normal mood and affect. Normal behavior. Normal judgment and thought content.   Urinalysis:      Assessment and Plan:  Pregnancy: G3P2002 at [redacted]w[redacted]d  1. Supervision of other normal pregnancy, antepartum Stable GBS next visit  2. Fragile x chromosome S/P genetic counseling  3. Velamentous insertion of umbilical cord in third trimester Serial growth scans as per MFM  4. VSD (ventricular septal defect) Maternal Normal ECHO  5. Abnormal pregnancy US Fetal Ptyalectasis Stable F/U MFM  scan scheudled  Preterm labor symptoms and general obstetric precautions including but not limited to vaginal bleeding, contractions, leaking of fluid and fetal movement were reviewed in detail with the patient. Please refer to After Visit Summary for other counseling recommendations.  Return in about 2 weeks (around 09/06/2020) for OB visit, face to face, MD only.   Hermina Staggers, MD

## 2020-08-27 ENCOUNTER — Other Ambulatory Visit: Payer: Self-pay

## 2020-09-06 ENCOUNTER — Ambulatory Visit: Payer: Medicaid Other | Attending: Obstetrics

## 2020-09-06 ENCOUNTER — Encounter: Payer: Self-pay | Admitting: *Deleted

## 2020-09-06 ENCOUNTER — Other Ambulatory Visit: Payer: Self-pay | Admitting: Obstetrics

## 2020-09-06 ENCOUNTER — Ambulatory Visit: Payer: Medicaid Other | Admitting: *Deleted

## 2020-09-06 ENCOUNTER — Other Ambulatory Visit: Payer: Self-pay

## 2020-09-06 DIAGNOSIS — O43123 Velamentous insertion of umbilical cord, third trimester: Secondary | ICD-10-CM | POA: Diagnosis present

## 2020-09-06 DIAGNOSIS — Z148 Genetic carrier of other disease: Secondary | ICD-10-CM

## 2020-09-06 DIAGNOSIS — O99213 Obesity complicating pregnancy, third trimester: Secondary | ICD-10-CM | POA: Diagnosis not present

## 2020-09-06 DIAGNOSIS — O358XX0 Maternal care for other (suspected) fetal abnormality and damage, not applicable or unspecified: Secondary | ICD-10-CM

## 2020-09-06 DIAGNOSIS — Q992 Fragile X chromosome: Secondary | ICD-10-CM | POA: Diagnosis present

## 2020-09-06 DIAGNOSIS — O43122 Velamentous insertion of umbilical cord, second trimester: Secondary | ICD-10-CM | POA: Diagnosis not present

## 2020-09-06 DIAGNOSIS — Q210 Ventricular septal defect: Secondary | ICD-10-CM

## 2020-09-06 DIAGNOSIS — Z3A36 36 weeks gestation of pregnancy: Secondary | ICD-10-CM

## 2020-09-06 DIAGNOSIS — Z87798 Personal history of other (corrected) congenital malformations: Secondary | ICD-10-CM

## 2020-09-06 DIAGNOSIS — E669 Obesity, unspecified: Secondary | ICD-10-CM

## 2020-09-07 ENCOUNTER — Encounter: Payer: Self-pay | Admitting: Family Medicine

## 2020-09-07 ENCOUNTER — Ambulatory Visit (INDEPENDENT_AMBULATORY_CARE_PROVIDER_SITE_OTHER): Payer: Medicaid Other | Admitting: Family Medicine

## 2020-09-07 ENCOUNTER — Other Ambulatory Visit (HOSPITAL_COMMUNITY)
Admission: RE | Admit: 2020-09-07 | Discharge: 2020-09-07 | Disposition: A | Discharge status: Home / Self Care | Payer: Medicaid Other | Source: Ambulatory Visit | Attending: Obstetrics and Gynecology | Admitting: Obstetrics and Gynecology

## 2020-09-07 ENCOUNTER — Other Ambulatory Visit: Payer: Self-pay | Admitting: *Deleted

## 2020-09-07 VITALS — BP 119/79 | HR 111 | Wt 214.0 lb

## 2020-09-07 DIAGNOSIS — Z3480 Encounter for supervision of other normal pregnancy, unspecified trimester: Secondary | ICD-10-CM | POA: Insufficient documentation

## 2020-09-07 DIAGNOSIS — O283 Abnormal ultrasonic finding on antenatal screening of mother: Secondary | ICD-10-CM

## 2020-09-07 DIAGNOSIS — Q992 Fragile X chromosome: Secondary | ICD-10-CM

## 2020-09-07 DIAGNOSIS — Q210 Ventricular septal defect: Secondary | ICD-10-CM

## 2020-09-07 DIAGNOSIS — O43123 Velamentous insertion of umbilical cord, third trimester: Secondary | ICD-10-CM

## 2020-09-07 NOTE — Progress Notes (Signed)
   Subjective:  [name] is a 35 y.o. G3P2002 at [redacted]w[redacted]d being seen today for ongoing prenatal care.  She is currently monitored for the following issues for this high-risk pregnancy and has GERD; Abnormal pregnancy [place]; Migraine headache with aura; Supervision of other normal pregnancy, antepartum; Velamentous insertion of umbilical cord; VSD (ventricular septal defect); and Fragile x chromosome on their problem list.  Patient reports no complaints.  Contractions: Irritability. Vag. Bleeding: None.  Movement: Present. Denies leaking of fluid.   The following portions of the patient's history were reviewed and updated as appropriate: allergies, current medications, past family history, past medical history, past social history, past surgical history and problem list. Problem list updated.  Objective:   Vitals:   09/07/20 1019  BP: 119/79  Pulse: (!) 111  Weight: 214 lb (97.1 kg)    Fetal Status: Fetal Heart Rate (bpm): 155   Movement: Present     General:  Alert, oriented and cooperative. Patient is in no acute distress.  Skin: Skin is warm and dry. No rash noted.   Cardiovascular: Normal heart rate noted  Respiratory: Normal respiratory effort, no problems with respiration noted  Abdomen: Soft, gravid, appropriate for gestational age. Pain/Pressure: Present     Pelvic: Vag. Bleeding: None Vag D/C Character: Thin   Cervical exam performed Dilation: 3 Effacement (%): Thick Station: Ballotable  Extremities: Normal range of motion.  Edema: Trace  Mental Status: Normal mood and affect. Normal behavior. Normal judgment and thought content.   Urinalysis:       Assessment and Plan:  Pregnancy: G3P2002 at [redacted]w[redacted]d  1. Supervision of other normal pregnancy, antepartum BP and FHR normal Following w MFM, infant measuring LGA Discussed contraception, planning to use barrier methods Swabs today  2. Velamentous insertion of umbilical cord in third trimester Follow w MFM stable  3. VSD  (ventricular septal defect) Fetal echo normal  4. Fragile x chromosome S/p counseling  5. Abnormal pregnancy [place] Stable L fetal renal pyelectasis  Preterm labor symptoms and general obstetric precautions including but not limited to vaginal bleeding, contractions, leaking of fluid and fetal movement were reviewed in detail with the patient. Please refer to After Visit Summary for other counseling recommendations.  Return in 1 week (on 09/14/2020) for ob visit.   Venora Maples, MD

## 2020-09-07 NOTE — Progress Notes (Signed)
ROB [redacted]w[redacted]d  GBS Due Today.  PHQ9 = 0 pt wld like cervix check today   CC: Braxton Hicks ctx's.

## 2020-09-07 NOTE — Patient Instructions (Signed)
 Contraception Choices Contraception, also called birth control, refers to methods or devices that prevent pregnancy. Hormonal methods Contraceptive implant A contraceptive implant is a thin, plastic tube that contains a hormone that prevents pregnancy. It is different from an intrauterine device (IUD). It is inserted into the upper part of the arm by a health care provider. Implants can be effective for up to 3 years. Progestin-only injections Progestin-only injections are injections of progestin, a synthetic form of the hormone progesterone. They are given every 3 months by a health care provider. Birth control pills Birth control pills are pills that contain hormones that prevent pregnancy. They must be taken once a day, preferably at the same time each day. A prescription is needed to use this method of contraception. Birth control patch The birth control patch contains hormones that prevent pregnancy. It is placed on the skin and must be changed once a week for three weeks and removed on the fourth week. A prescription is needed to use this method of contraception. Vaginal ring A vaginal ring contains hormones that prevent pregnancy. It is placed in the vagina for three weeks and removed on the fourth week. After that, the process is repeated with a new ring. A prescription is needed to use this method of contraception. Emergency contraceptive Emergency contraceptives prevent pregnancy after unprotected sex. They come in pill form and can be taken up to 5 days after sex. They work best the sooner they are taken after having sex. Most emergency contraceptives are available without a prescription. This method should not be used as your only form of birth control.   Barrier methods Female condom A female condom is a thin sheath that is worn over the penis during sex. Condoms keep sperm from going inside a woman's body. They can be used with a sperm-killing substance (spermicide) to increase their  effectiveness. They should be thrown away after one use. Female condom A female condom is a soft, loose-fitting sheath that is put into the vagina before sex. The condom keeps sperm from going inside a woman's body. They should be thrown away after one use. Diaphragm A diaphragm is a soft, dome-shaped barrier. It is inserted into the vagina before sex, along with a spermicide. The diaphragm blocks sperm from entering the uterus, and the spermicide kills sperm. A diaphragm should be left in the vagina for 6-8 hours after sex and removed within 24 hours. A diaphragm is prescribed and fitted by a health care provider. A diaphragm should be replaced every 1-2 years, after giving birth, after gaining more than 15 lb (6.8 kg), and after pelvic surgery. Cervical cap A cervical cap is a round, soft latex or plastic cup that fits over the cervix. It is inserted into the vagina before sex, along with spermicide. It blocks sperm from entering the uterus. The cap should be left in place for 6-8 hours after sex and removed within 48 hours. A cervical cap must be prescribed and fitted by a health care provider. It should be replaced every 2 years. Sponge A sponge is a soft, circular piece of polyurethane foam with spermicide in it. The sponge helps block sperm from entering the uterus, and the spermicide kills sperm. To use it, you make it wet and then insert it into the vagina. It should be inserted before sex, left in for at least 6 hours after sex, and removed and thrown away within 30 hours. Spermicides Spermicides are chemicals that kill or block sperm from entering the   cervix and uterus. They can come as a cream, jelly, suppository, foam, or tablet. A spermicide should be inserted into the vagina with an applicator at least 10-15 minutes before sex to allow time for it to work. The process must be repeated every time you have sex. Spermicides do not require a prescription.   Intrauterine  contraception Intrauterine device (IUD) An IUD is a T-shaped device that is put in a woman's uterus. There are two types:  Hormone IUD.This type contains progestin, a synthetic form of the hormone progesterone. This type can stay in place for 3-5 years.  Copper IUD.This type is wrapped in copper wire. It can stay in place for 10 years. Permanent methods of contraception Female tubal ligation In this method, a woman's fallopian tubes are sealed, tied, or blocked during surgery to prevent eggs from traveling to the uterus. Hysteroscopic sterilization In this method, a small, flexible insert is placed into each fallopian tube. The inserts cause scar tissue to form in the fallopian tubes and block them, so sperm cannot reach an egg. The procedure takes about 3 months to be effective. Another form of birth control must be used during those 3 months. Female sterilization This is a procedure to tie off the tubes that carry sperm (vasectomy). After the procedure, the man can still ejaculate fluid (semen). Another form of birth control must be used for 3 months after the procedure. Natural planning methods Natural family planning In this method, a couple does not have sex on days when the woman could become pregnant. Calendar method In this method, the woman keeps track of the length of each menstrual cycle, identifies the days when pregnancy can happen, and does not have sex on those days. Ovulation method In this method, a couple avoids sex during ovulation. Symptothermal method This method involves not having sex during ovulation. The woman typically checks for ovulation by watching changes in her temperature and in the consistency of cervical mucus. Post-ovulation method In this method, a couple waits to have sex until after ovulation. Where to find more information  Centers for Disease Control and Prevention: www.cdc.gov Summary  Contraception, also called birth control, refers to methods or  devices that prevent pregnancy.  Hormonal methods of contraception include implants, injections, pills, patches, vaginal rings, and emergency contraceptives.  Barrier methods of contraception can include female condoms, female condoms, diaphragms, cervical caps, sponges, and spermicides.  There are two types of IUDs (intrauterine devices). An IUD can be put in a woman's uterus to prevent pregnancy for 3-5 years.  Permanent sterilization can be done through a procedure for males and females. Natural family planning methods involve nothaving sex on days when the woman could become pregnant. This information is not intended to replace advice given to you by your health care provider. Make sure you discuss any questions you have with your health care provider. Document Revised: 08/29/2019 Document Reviewed: 08/29/2019 Elsevier Patient Education  2021 [employer] Inc.   Breastfeeding  Choosing to breastfeed is one of the best decisions you can make for yourself and your baby. A change in hormones during pregnancy causes your breasts to make breast milk in your milk-producing glands. Hormones prevent breast milk from being released before your baby is born. They also prompt milk flow after birth. Once breastfeeding has begun, thoughts of your baby, as well as his or her sucking or crying, can stimulate the release of milk from your milk-producing glands. Benefits of breastfeeding Research shows that breastfeeding offers many health benefits   for infants and mothers. It also offers a cost-free and convenient way to feed your baby. For your baby  Your first milk (colostrum) helps your baby's digestive system to function better.  Special cells in your milk (antibodies) help your baby to fight off infections.  Breastfed babies are less likely to develop asthma, allergies, obesity, or type 2 diabetes. They are also at lower risk for sudden infant death syndrome (SIDS).  Nutrients in breast milk are better  able to meet your baby's needs compared to infant formula.  Breast milk improves your baby's brain development. For you  Breastfeeding helps to create a very special bond between you and your baby.  Breastfeeding is convenient. Breast milk costs nothing and is always available at the correct temperature.  Breastfeeding helps to burn calories. It helps you to lose the weight that you gained during pregnancy.  Breastfeeding makes your uterus return faster to its size before pregnancy. It also slows bleeding (lochia) after you give birth.  Breastfeeding helps to lower your risk of developing type 2 diabetes, osteoporosis, rheumatoid arthritis, cardiovascular disease, and breast, ovarian, uterine, and endometrial cancer later in life. Breastfeeding basics Starting breastfeeding  Find a comfortable place to sit or lie down, with your neck and back well-supported.  Place a pillow or a rolled-up blanket under your baby to bring him or her to the level of your breast (if you are seated). Nursing pillows are specially designed to help support your arms and your baby while you breastfeed.  Make sure that your baby's tummy (abdomen) is facing your abdomen.  Gently massage your breast. With your fingertips, massage from the outer edges of your breast inward toward the nipple. This encourages milk flow. If your milk flows slowly, you may need to continue this action during the feeding.  Support your breast with 4 fingers underneath and your thumb above your nipple (make the letter "C" with your hand). Make sure your fingers are well away from your nipple and your baby's mouth.  Stroke your baby's lips gently with your finger or nipple.  When your baby's mouth is open wide enough, quickly bring your baby to your breast, placing your entire nipple and as much of the areola as possible into your baby's mouth. The areola is the colored area around your nipple. ? More areola should be visible above your  baby's upper lip than below the lower lip. ? Your baby's lips should be opened and extended outward (flanged) to ensure an adequate, comfortable latch. ? Your baby's tongue should be between his or her lower gum and your breast.  Make sure that your baby's mouth is correctly positioned around your nipple (latched). Your baby's lips should create a seal on your breast and be turned out (everted).  It is common for your baby to suck about 2-3 minutes in order to start the flow of breast milk. Latching Teaching your baby how to latch onto your breast properly is very important. An improper latch can cause nipple pain, decreased milk supply, and poor weight gain in your baby. Also, if your baby is not latched onto your nipple properly, he or she may swallow some air during feeding. This can make your baby fussy. Burping your baby when you switch breasts during the feeding can help to get rid of the air. However, teaching your baby to latch on properly is still the best way to prevent fussiness from swallowing air while breastfeeding. Signs that your baby has successfully latched onto   your nipple  Silent tugging or silent sucking, without causing you pain. Infant's lips should be extended outward (flanged).  Swallowing heard between every 3-4 sucks once your milk has started to flow (after your let-down milk reflex occurs).  Muscle movement above and in front of his or her ears while sucking. Signs that your baby has not successfully latched onto your nipple  Sucking sounds or smacking sounds from your baby while breastfeeding.  Nipple pain. If you think your baby has not latched on correctly, slip your finger into the corner of your baby's mouth to break the suction and place it between your baby's gums. Attempt to start breastfeeding again. Signs of successful breastfeeding Signs from your baby  Your baby will gradually decrease the number of sucks or will completely stop sucking.  Your baby  will fall asleep.  Your baby's body will relax.  Your baby will retain a small amount of milk in his or her mouth.  Your baby will let go of your breast by himself or herself. Signs from you  Breasts that have increased in firmness, weight, and size 1-3 hours after feeding.  Breasts that are softer immediately after breastfeeding.  Increased milk volume, as well as a change in milk consistency and color by the fifth day of breastfeeding.  Nipples that are not sore, cracked, or bleeding. Signs that your baby is getting enough milk  Wetting at least 1-2 diapers during the first 24 hours after birth.  Wetting at least 5-6 diapers every 24 hours for the first week after birth. The urine should be clear or pale yellow by the age of 5 days.  Wetting 6-8 diapers every 24 hours as your baby continues to grow and develop.  At least 3 stools in a 24-hour period by the age of 5 days. The stool should be soft and yellow.  At least 3 stools in a 24-hour period by the age of 7 days. The stool should be seedy and yellow.  No loss of weight greater than 10% of birth weight during the first 3 days of life.  Average weight gain of 4-7 oz (113-198 g) per week after the age of 4 days.  Consistent daily weight gain by the age of 5 days, without weight loss after the age of 2 weeks. After a feeding, your baby may spit up a small amount of milk. This is normal. Breastfeeding frequency and duration Frequent feeding will help you make more milk and can prevent sore nipples and extremely full breasts (breast engorgement). Breastfeed when you feel the need to reduce the fullness of your breasts or when your baby shows signs of hunger. This is called "breastfeeding on demand." Signs that your baby is hungry include:  Increased alertness, activity, or restlessness.  Movement of the head from side to side.  Opening of the mouth when the corner of the mouth or cheek is stroked (rooting).  Increased  sucking sounds, smacking lips, cooing, sighing, or squeaking.  Hand-to-mouth movements and sucking on fingers or hands.  Fussing or crying. Avoid introducing a pacifier to your baby in the first 4-6 weeks after your baby is born. After this time, you may choose to use a pacifier. Research has shown that pacifier use during the first year of a baby's life decreases the risk of sudden infant death syndrome (SIDS). Allow your baby to feed on each breast as long as he or she wants. When your baby unlatches or falls asleep while feeding from the   first breast, offer the second breast. Because newborns are often sleepy in the first few weeks of life, you may need to awaken your baby to get him or her to feed. Breastfeeding times will vary from baby to baby. However, the following rules can serve as a guide to help you make sure that your baby is properly fed:  Newborns (babies 4 weeks of age or younger) may breastfeed every 1-3 hours.  Newborns should not go without breastfeeding for longer than 3 hours during the day or 5 hours during the night.  You should breastfeed your baby a minimum of 8 times in a 24-hour period. Breast milk pumping Pumping and storing breast milk allows you to make sure that your baby is exclusively fed your breast milk, even at times when you are unable to breastfeed. This is especially important if you go back to work while you are still breastfeeding, or if you are not able to be present during feedings. Your lactation consultant can help you find a method of pumping that works best for you and give you guidelines about how long it is safe to store breast milk.      Caring for your breasts while you breastfeed Nipples can become dry, cracked, and sore while breastfeeding. The following recommendations can help keep your breasts moisturized and healthy:  Avoid using soap on your nipples.  Wear a supportive bra designed especially for nursing. Avoid wearing underwire-style  bras or extremely tight bras (sports bras).  Air-dry your nipples for 3-4 minutes after each feeding.  Use only cotton bra pads to absorb leaked breast milk. Leaking of breast milk between feedings is normal.  Use lanolin on your nipples after breastfeeding. Lanolin helps to maintain your skin's normal moisture barrier. Pure lanolin is not harmful (not toxic) to your baby. You may also hand express a few drops of breast milk and gently massage that milk into your nipples and allow the milk to air-dry. In the first few weeks after giving birth, some women experience breast engorgement. Engorgement can make your breasts feel heavy, warm, and tender to the touch. Engorgement peaks within 3-5 days after you give birth. The following recommendations can help to ease engorgement:  Completely empty your breasts while breastfeeding or pumping. You may want to start by applying warm, moist heat (in the shower or with warm, water-soaked hand towels) just before feeding or pumping. This increases circulation and helps the milk flow. If your baby does not completely empty your breasts while breastfeeding, pump any extra milk after he or she is finished.  Apply ice packs to your breasts immediately after breastfeeding or pumping, unless this is too uncomfortable for you. To do this: ? Put ice in a plastic bag. ? Place a towel between your skin and the bag. ? Leave the ice on for 20 minutes, 2-3 times a day.  Make sure that your baby is latched on and positioned properly while breastfeeding. If engorgement persists after 48 hours of following these recommendations, contact your health care provider or a lactation consultant. Overall health care recommendations while breastfeeding  Eat 3 healthy meals and 3 snacks every day. Well-nourished mothers who are breastfeeding need an additional 450-500 calories a day. You can meet this requirement by increasing the amount of a balanced diet that you eat.  Drink  enough water to keep your urine pale yellow or clear.  Rest often, relax, and continue to take your prenatal vitamins to prevent fatigue, stress, and low   vitamin and mineral levels in your body (nutrient deficiencies).  Do not use any products that contain nicotine or tobacco, such as cigarettes and e-cigarettes. Your baby may be harmed by chemicals from cigarettes that pass into breast milk and exposure to secondhand smoke. If you need help quitting, ask your health care provider.  Avoid alcohol.  Do not use illegal drugs or marijuana.  Talk with your health care provider before taking any medicines. These include over-the-counter and prescription medicines as well as vitamins and herbal supplements. Some medicines that may be harmful to your baby can pass through breast milk.  It is possible to become pregnant while breastfeeding. If birth control is desired, ask your health care provider about options that will be safe while breastfeeding your baby. Where to find more information: [employer]: www.llli.org Contact a health care provider if:  You feel like you want to stop breastfeeding or have become frustrated with breastfeeding.  Your nipples are cracked or bleeding.  Your breasts are red, tender, or warm.  You have: ? Painful breasts or nipples. ? A swollen area on either breast. ? A fever or chills. ? Nausea or vomiting. ? Drainage other than breast milk from your nipples.  Your breasts do not become full before feedings by the fifth day after you give birth.  You feel sad and depressed.  Your baby is: ? Too sleepy to eat well. ? Having trouble sleeping. ? More than 1 week old and wetting fewer than 6 diapers in a 24-hour period. ? Not gaining weight by 5 days of age.  Your baby has fewer than 3 stools in a 24-hour period.  Your baby's skin or the white parts of his or her eyes become yellow. Get help right away if:  Your baby is overly tired  (lethargic) and does not want to wake up and feed.  Your baby develops an unexplained fever. Summary  Breastfeeding offers many health benefits for infant and mothers.  Try to breastfeed your infant when he or she shows early signs of hunger.  Gently tickle or stroke your baby's lips with your finger or nipple to allow the baby to open his or her mouth. Bring the baby to your breast. Make sure that much of the areola is in your baby's mouth. Offer one side and burp the baby before you offer the other side.  Talk with your health care provider or lactation consultant if you have questions or you face problems as you breastfeed. This information is not intended to replace advice given to you by your health care provider. Make sure you discuss any questions you have with your health care provider. Document Revised: 06/18/2017 Document Reviewed: 04/25/2016 Elsevier Patient Education  2021 Elsevier Inc.  

## 2020-09-09 LAB — STREP GP B NAA: Strep Gp B NAA: NEGATIVE

## 2020-09-10 LAB — CERVICOVAGINAL ANCILLARY ONLY
Chlamydia: NEGATIVE
Comment: NEGATIVE
Comment: NORMAL
Neisseria Gonorrhea: NEGATIVE

## 2020-09-13 ENCOUNTER — Ambulatory Visit: Payer: Medicaid Other | Attending: Obstetrics and Gynecology | Admitting: *Deleted

## 2020-09-13 ENCOUNTER — Ambulatory Visit: Payer: Medicaid Other | Admitting: *Deleted

## 2020-09-13 ENCOUNTER — Encounter: Payer: Self-pay | Admitting: *Deleted

## 2020-09-13 ENCOUNTER — Other Ambulatory Visit: Payer: Self-pay

## 2020-09-13 VITALS — BP 128/76 | HR 112

## 2020-09-13 DIAGNOSIS — O43123 Velamentous insertion of umbilical cord, third trimester: Secondary | ICD-10-CM | POA: Diagnosis not present

## 2020-09-13 DIAGNOSIS — Z3A37 37 weeks gestation of pregnancy: Secondary | ICD-10-CM | POA: Insufficient documentation

## 2020-09-13 DIAGNOSIS — Q210 Ventricular septal defect: Secondary | ICD-10-CM

## 2020-09-13 DIAGNOSIS — Q992 Fragile X chromosome: Secondary | ICD-10-CM

## 2020-09-13 NOTE — Procedures (Signed)
[name] 01/03/1986 [redacted]w[redacted]d  Fetus A Non-Stress Test Interpretation for 09/13/20  Indication:  VCI, Pyelectasis  Fetal Heart Rate A Mode: External Baseline Rate (A): 145 bpm Variability: Moderate Accelerations: 15 x 15 Decelerations: None Multiple birth?: No  Uterine Activity Mode: Palpation, Toco Contraction Frequency (min): none Resting Tone Palpated: Relaxed Resting Time: Adequate  Interpretation (Fetal Testing) Nonstress Test Interpretation: Reactive Overall Impression: Reassuring for gestational age Comments: Dr. Grace Bushy reviewed tracing

## 2020-09-14 ENCOUNTER — Ambulatory Visit (INDEPENDENT_AMBULATORY_CARE_PROVIDER_SITE_OTHER): Payer: Medicaid Other | Admitting: Family Medicine

## 2020-09-14 ENCOUNTER — Encounter: Payer: Self-pay | Admitting: Family Medicine

## 2020-09-14 VITALS — BP 132/87 | HR 105 | Wt 212.2 lb

## 2020-09-14 DIAGNOSIS — O283 Abnormal ultrasonic finding on antenatal screening of mother: Secondary | ICD-10-CM

## 2020-09-14 DIAGNOSIS — O43123 Velamentous insertion of umbilical cord, third trimester: Secondary | ICD-10-CM

## 2020-09-14 DIAGNOSIS — Q992 Fragile X chromosome: Secondary | ICD-10-CM

## 2020-09-14 DIAGNOSIS — Q210 Ventricular septal defect: Secondary | ICD-10-CM

## 2020-09-14 DIAGNOSIS — Z3480 Encounter for supervision of other normal pregnancy, unspecified trimester: Secondary | ICD-10-CM

## 2020-09-14 NOTE — Progress Notes (Signed)
   Subjective:  [name] is a 35 y.o. G3P2002 at [redacted]w[redacted]d being seen today for ongoing prenatal care.  She is currently monitored for the following issues for this high-risk pregnancy and has GERD; Abnormal pregnancy [place]; Migraine headache with aura; Supervision of other normal pregnancy, antepartum; Velamentous insertion of umbilical cord; VSD (ventricular septal defect); and Fragile x chromosome on their problem list.  Patient reports no complaints.  Contractions: Irritability. Vag. Bleeding: None.  Movement: Present. Denies leaking of fluid.   The following portions of the patient's history were reviewed and updated as appropriate: allergies, current medications, past family history, past medical history, past social history, past surgical history and problem list. Problem list updated.  Objective:   Vitals:   09/14/20 0854  BP: 132/87  Pulse: (!) 105  Weight: 212 lb 3.2 oz (96.3 kg)    Fetal Status: Fetal Heart Rate (bpm): 146    Movement: Present     General:  Alert, oriented and cooperative. Patient is in no acute distress.  Skin: Skin is warm and dry. No rash noted.   Cardiovascular: Normal heart rate noted  Respiratory: Normal respiratory effort, no problems with respiration noted  Abdomen: Soft, gravid, appropriate for gestational age. Pain/Pressure: Present     Pelvic: Vag. Bleeding: None     Cervical exam deferred        Extremities: Normal range of motion.  Edema: Trace  Mental Status: Normal mood and affect. Normal behavior. Normal judgment and thought content.   Urinalysis:      Assessment and Plan:  Pregnancy: G3P2002 at [redacted]w[redacted]d  1. 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 Velamentous insertion of umbilical cord in  third trimester Getting weekly antenatal testing though does not appear it was done this week Has scheduled for next week on 6/15  3. VSD (ventricular septal defect) Maternal hx, fetal echo normal  4. Fragile x chromosome S/p counseling  5. Abnormal pregnancy [place] Stable L fetal renal pyelectasis  Term labor symptoms and general obstetric precautions including but not limited to vaginal bleeding, contractions, leaking of fluid and fetal movement were reviewed in detail with the patient. Please refer to After Visit Summary for other counseling recommendations.  Return in 1 week (on 09/21/2020).   Venora Maples, MD

## 2020-09-14 NOTE — Progress Notes (Signed)
Pt presents for ROB with questions about IOL date?

## 2020-09-14 NOTE — Patient Instructions (Signed)

## 2020-09-19 ENCOUNTER — Other Ambulatory Visit: Payer: Self-pay | Admitting: Obstetrics and Gynecology

## 2020-09-19 ENCOUNTER — Other Ambulatory Visit: Payer: Self-pay

## 2020-09-19 ENCOUNTER — Encounter: Payer: Self-pay | Admitting: *Deleted

## 2020-09-19 ENCOUNTER — Ambulatory Visit: Payer: Medicaid Other | Admitting: *Deleted

## 2020-09-19 ENCOUNTER — Other Ambulatory Visit: Payer: Self-pay | Admitting: Advanced Practice Midwife

## 2020-09-19 ENCOUNTER — Ambulatory Visit: Payer: Medicaid Other | Attending: Obstetrics and Gynecology

## 2020-09-19 VITALS — BP 116/70 | HR 113

## 2020-09-19 DIAGNOSIS — O43123 Velamentous insertion of umbilical cord, third trimester: Secondary | ICD-10-CM

## 2020-09-19 DIAGNOSIS — Q992 Fragile X chromosome: Secondary | ICD-10-CM

## 2020-09-19 DIAGNOSIS — Z3A38 38 weeks gestation of pregnancy: Secondary | ICD-10-CM | POA: Insufficient documentation

## 2020-09-19 DIAGNOSIS — O283 Abnormal ultrasonic finding on antenatal screening of mother: Secondary | ICD-10-CM | POA: Insufficient documentation

## 2020-09-19 DIAGNOSIS — Q210 Ventricular septal defect: Secondary | ICD-10-CM | POA: Diagnosis present

## 2020-09-19 NOTE — Procedures (Signed)
[name] 03-17-1986 [redacted]w[redacted]d  Fetus A Non-Stress Test Interpretation for 09/19/20  Indication: Unsatisfactory BPP  Fetal Heart Rate A Mode: External Baseline Rate (A): 155 bpm Variability: Moderate Accelerations: 15 x 15 Decelerations: None Multiple birth?: No  Uterine Activity Mode: Palpation, Toco Contraction Frequency (min): 1 uc Contraction Duration (sec): 160 Contraction Quality: Mild Resting Tone Palpated: Relaxed Resting Time: Adequate  Interpretation (Fetal Testing) Nonstress Test Interpretation: Reactive Overall Impression: Reassuring for gestational age Comments: Dr. Judeth Cornfield reviewed tracing.

## 2020-09-21 ENCOUNTER — Ambulatory Visit (INDEPENDENT_AMBULATORY_CARE_PROVIDER_SITE_OTHER): Payer: Medicaid Other | Admitting: Nurse Practitioner

## 2020-09-21 ENCOUNTER — Other Ambulatory Visit: Payer: Self-pay

## 2020-09-21 ENCOUNTER — Telehealth (HOSPITAL_COMMUNITY): Payer: Self-pay | Admitting: *Deleted

## 2020-09-21 ENCOUNTER — Encounter (HOSPITAL_COMMUNITY): Payer: Self-pay | Admitting: *Deleted

## 2020-09-21 VITALS — BP 134/90 | HR 98 | Wt 214.2 lb

## 2020-09-21 DIAGNOSIS — Z3480 Encounter for supervision of other normal pregnancy, unspecified trimester: Secondary | ICD-10-CM

## 2020-09-21 DIAGNOSIS — Z3A38 38 weeks gestation of pregnancy: Secondary | ICD-10-CM

## 2020-09-21 DIAGNOSIS — R030 Elevated blood-pressure reading, without diagnosis of hypertension: Secondary | ICD-10-CM

## 2020-09-21 NOTE — Progress Notes (Signed)
    Subjective:  [name] is a 35 y.o. G3P2002 at [redacted]w[redacted]d being seen today for ongoing prenatal care.  She is currently monitored for the following issues for this high-risk pregnancy and has GERD; Abnormal pregnancy [place]; Migraine headache with aura; Supervision of other normal pregnancy, antepartum; Velamentous insertion of umbilical cord; VSD (ventricular septal defect); and Fragile x chromosome on their problem list.  Patient reports  no problems .  Contractions: Irregular. Vag. Bleeding: None.  Movement: Present. Denies leaking of fluid.   The following portions of the patient's history were reviewed and updated as appropriate: allergies, current medications, past family history, past medical history, past social history, past surgical history and problem list. Problem list updated.  Objective:   Vitals:   09/21/20 0852 09/21/20 0856  BP: (!) 134/93 134/90  Pulse: 99 98  Weight: 214 lb 3.2 oz (97.2 kg)     Fetal Status: Fetal Heart Rate (bpm): 145 Fundal Height: 39 cm Movement: Present     General:  Alert, oriented and cooperative. Patient is in no acute distress.  Skin: Skin is warm and dry. No rash noted.   Cardiovascular: Normal heart rate noted  Respiratory: Normal respiratory effort, no problems with respiration noted  Abdomen: Soft, gravid, appropriate for gestational age. Pain/Pressure: Present     Pelvic:  Cervical exam deferred        Extremities: Normal range of motion.  Edema: Trace  Mental Status: Normal mood and affect. Normal behavior. Normal judgment and thought content.   Urinalysis:      Assessment and Plan:  Pregnancy: G3P2002 at [redacted]w[redacted]d  1. Supervision of other normal pregnancy, antepartum Scheduled for induction on 09-29-20 for velamentous cord and pyelectasis. Having irregular contractions - noticing more pressure and back pain.  - [place] MFM FETAL BPP WO NON STRESS; Future - CBC - Comprehensive metabolic panel - Protein / creatinine ratio, urine  2.  Elevated BP without diagnosis of hypertension When asked, reports an occasional mild headache that resolves well with no Tylenol No RUQ pain, no visual changes Mild edema in ankles and hands Had BPP looking at fetal growth on 09-06-20 and 09-19-20 but no future BPP scheduled.  Since BP is creeping up, will try to schedule BPP next week. Has BP cuff at home and agrees to check BP daily over the weekend.  With any elevations of 140/90 (either value) advised to go to MAU. Reports that BP at home have been normal.  Is not recording in Babyscripts  - [place] MFM FETAL BPP WO NON STRESS; Future - CBC - Comprehensive metabolic panel - Protein / creatinine ratio, urine  3. [redacted] weeks gestation of pregnancy   Term labor symptoms and general obstetric precautions including but not limited to vaginal bleeding, contractions, leaking of fluid and fetal movement were reviewed in detail with the patient. Please refer to After Visit Summary for other counseling recommendations.  Return in about 3 days (around 09/24/2020) for BP check and ROB.  Nolene Bernheim, RN, MSN, NP-BC Nurse Practitioner, [facility] for [employer], [facility] 09/21/2020 9:33 AM

## 2020-09-21 NOTE — Telephone Encounter (Signed)
Preadmission screen  

## 2020-09-21 NOTE — Progress Notes (Signed)
Pt reports fetal movement with irregular contractions. 

## 2020-09-22 LAB — COMPREHENSIVE METABOLIC PANEL
ALT: 5 IU/L (ref 0–32)
AST: 17 IU/L (ref 0–40)
Albumin/Globulin Ratio: 1.7 (ref 1.2–2.2)
Albumin: 3.8 g/dL (ref 3.8–4.8)
Alkaline Phosphatase: 196 IU/L — ABNORMAL HIGH (ref 44–121)
BUN/Creatinine Ratio: 14 (ref 9–23)
BUN: 7 mg/dL (ref 6–20)
Bilirubin Total: 0.2 mg/dL (ref 0.0–1.2)
CO2: 18 mmol/L — ABNORMAL LOW (ref 20–29)
Calcium: 9.3 mg/dL (ref 8.7–10.2)
Chloride: 103 mmol/L (ref 96–106)
Creatinine, Ser: 0.51 mg/dL — ABNORMAL LOW (ref 0.57–1.00)
Globulin, Total: 2.2 g/dL (ref 1.5–4.5)
Glucose: 78 mg/dL (ref 65–99)
Potassium: 4.6 mmol/L (ref 3.5–5.2)
Sodium: 137 mmol/L (ref 134–144)
Total Protein: 6 g/dL (ref 6.0–8.5)
eGFR: 126 mL/min/{1.73_m2} (ref >59)

## 2020-09-22 LAB — PROTEIN / CREATININE RATIO, URINE
Creatinine, Urine: 48.3 mg/dL
Protein, Ur: 8.5 mg/dL
Protein/Creat Ratio: 176 mg/g creat (ref 0–200)

## 2020-09-22 LAB — CBC
Hematocrit: 37.6 % (ref 34.0–46.6)
Hemoglobin: 12.4 g/dL (ref 11.1–15.9)
MCH: 27.3 pg (ref 26.6–33.0)
MCHC: 33 g/dL (ref 31.5–35.7)
MCV: 83 fL (ref 79–97)
Platelets: 233 10*3/uL (ref 150–450)
RBC: 4.55 x10E6/uL (ref 3.77–5.28)
RDW: 14.8 % (ref 11.7–15.4)
WBC: 9.5 10*3/uL (ref 3.4–10.8)

## 2020-09-24 ENCOUNTER — Ambulatory Visit (INDEPENDENT_AMBULATORY_CARE_PROVIDER_SITE_OTHER): Payer: Medicaid Other | Admitting: Obstetrics & Gynecology

## 2020-09-24 ENCOUNTER — Other Ambulatory Visit: Payer: Self-pay

## 2020-09-24 VITALS — BP 126/86 | HR 99 | Wt 216.0 lb

## 2020-09-24 DIAGNOSIS — O43123 Velamentous insertion of umbilical cord, third trimester: Secondary | ICD-10-CM

## 2020-09-24 DIAGNOSIS — Z3480 Encounter for supervision of other normal pregnancy, unspecified trimester: Secondary | ICD-10-CM

## 2020-09-24 NOTE — Progress Notes (Signed)
ROB, reports no problems today.  IOL is scheduled for 09/29/2020.

## 2020-09-24 NOTE — Progress Notes (Signed)
   PRENATAL VISIT NOTE  Subjective:  [name] is a 35 y.o. G3P2002 at [redacted]w[redacted]d being seen today for ongoing prenatal care.  [name]; Abnormal pregnancy [place]; Migraine headache with aura; Supervision of other normal pregnancy, antepartum; Velamentous insertion of umbilical cord; VSD (ventricular septal defect); and Fragile x chromosome on their problem list.  Patient reports occasional contractions.  Contractions: Irritability. Vag. Bleeding: None.  Movement: Present. Denies leaking of fluid.   The following portions of the patient's history were reviewed and updated as appropriate: allergies, current medications, past family history, past medical history, past social history, past surgical history and problem list.   Objective:   Vitals:   09/24/20 0815  BP: 126/86  Pulse: 99  Weight: 216 lb (98 kg)    Fetal Status: Fetal Heart Rate (bpm): 145   Movement: Present     General:  Alert, oriented and cooperative. Patient is in no acute distress.  Skin: Skin is warm and dry. No rash noted.   Cardiovascular: Normal heart rate noted  Respiratory: Normal respiratory effort, no problems with respiration noted  Abdomen: Soft, gravid, appropriate for gestational age.  Pain/Pressure: Present     Pelvic: Cervical exam performed in the presence of a chaperone        Extremities: Normal range of motion.  Edema: Trace  Mental Status: Normal mood and affect. Normal behavior. Normal judgment and thought content.   Assessment and Plan:  Pregnancy: G3P2002 at [redacted]w[redacted]d 1. Supervision of other normal pregnancy, antepartum IOL 6/25  2. Velamentous insertion of umbilical cord in third trimester Normal growth and weekly surveillance  Term labor symptoms and general obstetric precautions including but not limited to vaginal bleeding, contractions, leaking of fluid and fetal movement were reviewed in detail with the  patient. Please refer to After Visit Summary for other counseling recommendations.   Return if symptoms worsen or fail to improve, for postpartum.  Future Appointments  Date Time Provider Department Center  09/25/2020 10:15 AM [facility] NST [facility]  09/27/2020  9:50 AM [facility]-SCREENING [facility]SD[facility] None  09/29/2020  8:25 AM [facility]-LD SCHED ROOM [facility]INDC None    Scheryl Darter, MD

## 2020-09-24 NOTE — Patient Instructions (Signed)
Labor Induction ?Labor induction is when steps are taken to cause a pregnant woman to begin the labor process. Most women go into labor on their own between 37 weeks and 42 weeks of pregnancy. When this does not happen, or when there is a medical need for labor to begin, steps may be taken to induce, or bring on, labor. ?Labor induction causes a pregnant woman's uterus to contract. It also causes the cervix to soften (ripen), open (dilate), and thin out. Usually, labor is not induced before 39 weeks of pregnancy unless there is a medical reason to do so. ?When is labor induction considered? ?Labor induction may be right for you if: ?Your pregnancy lasts longer than 41 to 42 weeks. ?Your placenta is separating from your uterus (placental abruption). ?You have a rupture of membranes and your labor does not begin. ?You have health problems, like diabetes or high blood pressure (preeclampsia) during your pregnancy. ?Your baby has stopped growing or does not have enough amniotic fluid. ?Before labor induction begins, your health care provider will consider the following factors: ?Your medical condition and the baby's condition. ?How many weeks you have been pregnant. ?How mature the baby's lungs are. ?The condition of your cervix. ?The position of the baby. ?The size of your birth canal. ?Tell a health care provider about: ?Any allergies you have. ?All medicines you are taking, including vitamins, herbs, eye drops, creams, and over-the-counter medicines. ?Any problems you or your family members have had with anesthetic medicines. ?Any surgeries you have had. ?Any blood disorders you have. ?Any medical conditions you have. ?What are the risks? ?Generally, this is a safe procedure. However, problems may occur, including: ?Failed induction. ?Changes in fetal heart rate, such as being too high, too low, or irregular (erratic). ?Infection in the mother or the baby. ?Increased risk of having a cesarean delivery. ?Breaking off  (abruption) of the placenta from the uterus. This is rare. ?Rupture of the uterus. This is very rare. ?Your baby could fail to get enough blood flow or oxygen. This can be life-threatening. ?When induction is needed for medical reasons, the benefits generally outweigh the risks. ?What happens during the procedure? ?During the procedure, your health care provider will use one of these methods to induce labor: ?Stripping the membranes. In this method, the amniotic sac tissue is gently separated from the cervix. This causes the following to happen: ?Your cervix stretches, which in turn causes the release of prostaglandins. ?Prostaglandins induce labor and cause the uterus to contract. ?This procedure is often done in an office visit. You will be sent home to wait for contractions to begin. ?Prostaglandin medicine. This medicine starts contractions and causes the cervix to dilate and ripen. This can be taken by mouth (orally) or by being inserted into the vagina (suppository). ?Inserting a small, thin tube (catheter) with a balloon into the vagina and then expanding the balloon with water to dilate the cervix. ?Breaking the water. In this method, a small instrument is used to make a small hole in the amniotic sac. This eventually causes the amniotic sac to break. Contractions should begin within a few hours. ?Medicine to trigger or strengthen contractions. This medicine is given through an IV that is inserted into a vein in your arm. ?This procedure may vary among health care providers and hospitals. ?Where to find more information ?March of Dimes: www.marchofdimes.org ?The American College of Obstetricians and Gynecologists: www.acog.org ?Summary ?Labor induction causes a pregnant woman's uterus to contract. It also causes the cervix   to soften (ripen), open (dilate), and thin out. ?Labor is usually not induced before 39 weeks of pregnancy unless there is a medical reason to do so. ?When induction is needed for medical  reasons, the benefits generally outweigh the risks. ?Talk with your health care provider about which methods of labor induction are right for you. ?This information is not intended to replace advice given to you by your health care provider. Make sure you discuss any questions you have with your health care provider. ?Document Revised: 01/05/2020 Document Reviewed: 01/05/2020 ?Elsevier Patient Education ? 2022 Elsevier Inc. ? ?

## 2020-09-25 ENCOUNTER — Ambulatory Visit (INDEPENDENT_AMBULATORY_CARE_PROVIDER_SITE_OTHER): Payer: Medicaid Other

## 2020-09-25 ENCOUNTER — Ambulatory Visit (INDEPENDENT_AMBULATORY_CARE_PROVIDER_SITE_OTHER): Payer: Medicaid Other | Admitting: General Practice

## 2020-09-25 VITALS — BP 131/86 | HR 104

## 2020-09-25 DIAGNOSIS — Q210 Ventricular septal defect: Secondary | ICD-10-CM

## 2020-09-25 DIAGNOSIS — O43123 Velamentous insertion of umbilical cord, third trimester: Secondary | ICD-10-CM | POA: Diagnosis not present

## 2020-09-25 DIAGNOSIS — Z3A38 38 weeks gestation of pregnancy: Secondary | ICD-10-CM | POA: Diagnosis not present

## 2020-09-25 DIAGNOSIS — Q992 Fragile X chromosome: Secondary | ICD-10-CM

## 2020-09-25 DIAGNOSIS — Z3480 Encounter for supervision of other normal pregnancy, unspecified trimester: Secondary | ICD-10-CM

## 2020-09-25 NOTE — Progress Notes (Signed)
Pt informed that the ultrasound is considered a limited OB ultrasound and is not intended to be a complete ultrasound exam.  Patient also informed that the ultrasound is not being completed with the intent of assessing for fetal or placental anomalies or any pelvic abnormalities.  Explained that the purpose of today's ultrasound is to assess for  BPP, presentation, and AFI.  Patient acknowledges the purpose of the exam and the limitations of the study.     Chase Caller RN BSN 09/25/20

## 2020-09-27 ENCOUNTER — Encounter (HOSPITAL_COMMUNITY): Payer: Self-pay | Admitting: Family Medicine

## 2020-09-27 ENCOUNTER — Other Ambulatory Visit: Payer: Self-pay

## 2020-09-27 ENCOUNTER — Other Ambulatory Visit (HOSPITAL_COMMUNITY): Payer: Medicaid Other | Attending: Obstetrics and Gynecology

## 2020-09-27 ENCOUNTER — Inpatient Hospital Stay (HOSPITAL_COMMUNITY)
Admission: AD | Admit: 2020-09-27 | Discharge: 2020-09-28 | DRG: 807 | Disposition: A | Discharge status: Home / Self Care | Payer: Medicaid Other | Attending: Obstetrics and Gynecology | Admitting: Obstetrics and Gynecology

## 2020-09-27 DIAGNOSIS — O26893 Other specified pregnancy related conditions, third trimester: Secondary | ICD-10-CM | POA: Diagnosis present

## 2020-09-27 DIAGNOSIS — O3663X0 Maternal care for excessive fetal growth, third trimester, not applicable or unspecified: Principal | ICD-10-CM | POA: Diagnosis present

## 2020-09-27 DIAGNOSIS — O283 Abnormal ultrasonic finding on antenatal screening of mother: Secondary | ICD-10-CM | POA: Diagnosis present

## 2020-09-27 DIAGNOSIS — Z3480 Encounter for supervision of other normal pregnancy, unspecified trimester: Secondary | ICD-10-CM

## 2020-09-27 DIAGNOSIS — Z3490 Encounter for supervision of normal pregnancy, unspecified, unspecified trimester: Secondary | ICD-10-CM | POA: Diagnosis present

## 2020-09-27 DIAGNOSIS — Z3A39 39 weeks gestation of pregnancy: Secondary | ICD-10-CM

## 2020-09-27 DIAGNOSIS — O4202 Full-term premature rupture of membranes, onset of labor within 24 hours of rupture: Secondary | ICD-10-CM

## 2020-09-27 DIAGNOSIS — Z20822 Contact with and (suspected) exposure to covid-19: Secondary | ICD-10-CM | POA: Diagnosis present

## 2020-09-27 DIAGNOSIS — O43123 Velamentous insertion of umbilical cord, third trimester: Secondary | ICD-10-CM | POA: Diagnosis present

## 2020-09-27 DIAGNOSIS — R030 Elevated blood-pressure reading, without diagnosis of hypertension: Secondary | ICD-10-CM | POA: Diagnosis present

## 2020-09-27 DIAGNOSIS — Z87891 Personal history of nicotine dependence: Secondary | ICD-10-CM

## 2020-09-27 DIAGNOSIS — O358XX0 Maternal care for other (suspected) fetal abnormality and damage, not applicable or unspecified: Secondary | ICD-10-CM | POA: Diagnosis present

## 2020-09-27 DIAGNOSIS — Q210 Ventricular septal defect: Secondary | ICD-10-CM

## 2020-09-27 DIAGNOSIS — O43129 Velamentous insertion of umbilical cord, unspecified trimester: Secondary | ICD-10-CM | POA: Diagnosis present

## 2020-09-27 DIAGNOSIS — Q992 Fragile X chromosome: Secondary | ICD-10-CM

## 2020-09-27 DIAGNOSIS — O163 Unspecified maternal hypertension, third trimester: Secondary | ICD-10-CM | POA: Diagnosis present

## 2020-09-27 LAB — COMPREHENSIVE METABOLIC PANEL
ALT: 7 U/L (ref 0–44)
AST: 31 U/L (ref 15–41)
Albumin: 2.7 g/dL — ABNORMAL LOW (ref 3.5–5.0)
Alkaline Phosphatase: 159 U/L — ABNORMAL HIGH (ref 38–126)
Anion gap: 9 (ref 5–15)
BUN: 10 mg/dL (ref 6–20)
CO2: 18 mmol/L — ABNORMAL LOW (ref 22–32)
Calcium: 8.8 mg/dL — ABNORMAL LOW (ref 8.9–10.3)
Chloride: 107 mmol/L (ref 98–111)
Creatinine, Ser: 0.54 mg/dL (ref 0.44–1.00)
GFR, Estimated: >60 mL/min (ref >60)
Glucose, Bld: 90 mg/dL (ref 70–99)
Potassium: 4.5 mmol/L (ref 3.5–5.1)
Sodium: 134 mmol/L — ABNORMAL LOW (ref 135–145)
Total Bilirubin: 0.4 mg/dL (ref 0.3–1.2)
Total Protein: 5.9 g/dL — ABNORMAL LOW (ref 6.5–8.1)

## 2020-09-27 LAB — CBC
HCT: 37 % (ref 36.0–46.0)
Hemoglobin: 12.6 g/dL (ref 12.0–15.0)
MCH: 27.6 pg (ref 26.0–34.0)
MCHC: 34.1 g/dL (ref 30.0–36.0)
MCV: 81 fL (ref 80.0–100.0)
Platelets: 218 10*3/uL (ref 150–400)
RBC: 4.57 MIL/uL (ref 3.87–5.11)
RDW: 14.7 % (ref 11.5–15.5)
WBC: 9 10*3/uL (ref 4.0–10.5)
nRBC: 0 % (ref 0.0–0.2)

## 2020-09-27 LAB — RESP PANEL BY RT-PCR (FLU A&B, COVID) ARPGX2
Influenza A by PCR: NEGATIVE
Influenza B by PCR: NEGATIVE
SARS Coronavirus 2 by RT PCR: NEGATIVE

## 2020-09-27 LAB — POCT FERN TEST: POCT Fern Test: POSITIVE

## 2020-09-27 LAB — TYPE AND SCREEN
ABO/RH(D): A POS
Antibody Screen: NEGATIVE

## 2020-09-27 LAB — RPR: RPR Ser Ql: NONREACTIVE

## 2020-09-27 MED ORDER — DIPHENHYDRAMINE HCL 25 MG PO CAPS: 25.0000 mg | ORAL_CAPSULE | Freq: Four times a day (QID) | ORAL | Status: DC | PRN

## 2020-09-27 MED ORDER — SIMETHICONE 80 MG PO CHEW: 80.0000 mg | CHEWABLE_TABLET | ORAL | Status: DC | PRN

## 2020-09-27 MED ORDER — ACETAMINOPHEN 325 MG PO TABS
650.0000 mg | ORAL_TABLET | ORAL | Status: DC | PRN
  Administered 2020-09-27: 650 mg via ORAL
  Filled 2020-09-27: qty 2

## 2020-09-27 MED ORDER — SOD CITRATE-CITRIC ACID 500-334 MG/5ML PO SOLN: 30.0000 mL | ORAL | Status: DC | PRN

## 2020-09-27 MED ORDER — COCONUT OIL OIL
1.0000 "application " | TOPICAL_OIL | Status: DC | PRN
  Administered 2020-09-27: 1 via TOPICAL

## 2020-09-27 MED ORDER — DIBUCAINE (PERIANAL) 1 % EX OINT: 1.0000 "application " | TOPICAL_OINTMENT | CUTANEOUS | Status: DC | PRN

## 2020-09-27 MED ORDER — ZOLPIDEM TARTRATE 5 MG PO TABS: 5.0000 mg | ORAL_TABLET | Freq: Every evening | ORAL | Status: DC | PRN

## 2020-09-27 MED ORDER — BENZOCAINE-MENTHOL 20-0.5 % EX AERO
1.0000 "application " | INHALATION_SPRAY | CUTANEOUS | Status: DC | PRN
  Administered 2020-09-27: 1 via TOPICAL
  Filled 2020-09-27: qty 56

## 2020-09-27 MED ORDER — IBUPROFEN 800 MG PO TABS
800.0000 mg | ORAL_TABLET | Freq: Once | ORAL | Status: AC
  Administered 2020-09-27: 800 mg via ORAL
  Filled 2020-09-27: qty 1

## 2020-09-27 MED ORDER — MEDROXYPROGESTERONE ACETATE 150 MG/ML IM SUSP: 150.0000 mg | INTRAMUSCULAR | Status: DC | PRN

## 2020-09-27 MED ORDER — ACETAMINOPHEN 325 MG PO TABS: 650.0000 mg | ORAL_TABLET | ORAL | Status: DC | PRN

## 2020-09-27 MED ORDER — TERBUTALINE SULFATE 1 MG/ML IJ SOLN: 0.2500 mg | Freq: Once | INTRAMUSCULAR | Status: DC | PRN

## 2020-09-27 MED ORDER — OXYTOCIN-SODIUM CHLORIDE 30-0.9 UT/500ML-% IV SOLN: 1.0000 m[IU]/min | INTRAVENOUS | Status: DC

## 2020-09-27 MED ORDER — MEASLES, MUMPS & RUBELLA VAC IJ SOLR: 0.5000 mL | Freq: Once | INTRAMUSCULAR | Status: DC

## 2020-09-27 MED ORDER — OXYTOCIN BOLUS FROM INFUSION
333.0000 mL | Freq: Once | INTRAVENOUS | Status: AC
  Administered 2020-09-27: 333 mL via INTRAVENOUS

## 2020-09-27 MED ORDER — BUTORPHANOL TARTRATE 1 MG/ML IJ SOLN
1.0000 mg | INTRAMUSCULAR | Status: DC | PRN
  Administered 2020-09-27 (×2): 1 mg via INTRAVENOUS
  Filled 2020-09-27 (×2): qty 1

## 2020-09-27 MED ORDER — WITCH HAZEL-GLYCERIN EX PADS: 1.0000 "application " | MEDICATED_PAD | CUTANEOUS | Status: DC | PRN

## 2020-09-27 MED ORDER — ONDANSETRON HCL 4 MG/2ML IJ SOLN: 4.0000 mg | Freq: Four times a day (QID) | INTRAMUSCULAR | Status: DC | PRN

## 2020-09-27 MED ORDER — IBUPROFEN 600 MG PO TABS
600.0000 mg | ORAL_TABLET | Freq: Four times a day (QID) | ORAL | Status: DC
  Administered 2020-09-27 – 2020-09-28 (×4): 600 mg via ORAL
  Filled 2020-09-27 (×4): qty 1

## 2020-09-27 MED ORDER — ONDANSETRON HCL 4 MG PO TABS: 4.0000 mg | ORAL_TABLET | ORAL | Status: DC | PRN

## 2020-09-27 MED ORDER — OXYTOCIN-SODIUM CHLORIDE 30-0.9 UT/500ML-% IV SOLN
2.5000 [IU]/h | INTRAVENOUS | Status: DC
  Filled 2020-09-27: qty 500

## 2020-09-27 MED ORDER — LACTATED RINGERS IV SOLN
500.0000 mL | INTRAVENOUS | Status: DC | PRN
  Administered 2020-09-27: 1000 mL via INTRAVENOUS
  Administered 2020-09-27: 500 mL via INTRAVENOUS

## 2020-09-27 MED ORDER — ONDANSETRON HCL 4 MG/2ML IJ SOLN: 4.0000 mg | INTRAMUSCULAR | Status: DC | PRN

## 2020-09-27 MED ORDER — TETANUS-DIPHTH-ACELL PERTUSSIS 5-2.5-18.5 LF-MCG/0.5 IM SUSY: 0.5000 mL | PREFILLED_SYRINGE | Freq: Once | INTRAMUSCULAR | Status: DC

## 2020-09-27 MED ORDER — LACTATED RINGERS IV SOLN: INTRAVENOUS | Status: DC

## 2020-09-27 MED ORDER — FENTANYL CITRATE (PF) 100 MCG/2ML IJ SOLN: 50.0000 ug | INTRAMUSCULAR | Status: DC | PRN

## 2020-09-27 MED ORDER — LIDOCAINE HCL (PF) 1 % IJ SOLN: 30.0000 mL | INTRAMUSCULAR | Status: DC | PRN

## 2020-09-27 MED ORDER — PRENATAL MULTIVITAMIN CH
1.0000 | ORAL_TABLET | Freq: Every day | ORAL | Status: DC
  Administered 2020-09-28: 1 via ORAL
  Filled 2020-09-27: qty 1

## 2020-09-27 MED ORDER — SENNOSIDES-DOCUSATE SODIUM 8.6-50 MG PO TABS
2.0000 | ORAL_TABLET | Freq: Every day | ORAL | Status: DC
  Administered 2020-09-28: 2 via ORAL
  Filled 2020-09-27: qty 2

## 2020-09-27 MED ORDER — FERROUS SULFATE 325 (65 FE) MG PO TABS: 325.0000 mg | ORAL_TABLET | ORAL | Status: DC

## 2020-09-27 NOTE — Discharge Summary (Signed)
Postpartum Discharge Summary    Patient Name: [name] DOB: [birth year] MRN: [id number]  Date of admission: 09/27/2020 Delivery date:09/27/2020  Delivering provider: Janet Berlin  Date of discharge: 09/28/2020  Admitting diagnosis: Encounter for elective induction of labor [Z34.90] Intrauterine pregnancy: [redacted]w[redacted]d    Secondary diagnosis:  Active Problems:   Abnormal pregnancy [place]  Supervision of other normal pregnancy, antepartum   Velamentous insertion of umbilical cord   VSD (ventricular septal defect)   Fragile x chromosome   Encounter for elective induction of labor   Elevated blood pressure affecting pregnancy in third trimester, antepartum   Vaginal delivery  Additional problems: as noted above Discharge diagnosis: Term Pregnancy Delivered                                              Post partum procedures:none Augmentation: N/A Complications: None  Hospital course: Onset of Labor With Vaginal Delivery      35y.o. yo G3P2002 at 337w1das admitted in Active Labor on 09/27/2020. Patient had an uncomplicated labor course as follows:  Membrane Rupture Time/Date: 3:00 AM ,09/27/2020   Delivery Method:Vaginal, Spontaneous  Episiotomy: None  Lacerations:  None  Patient had an uncomplicated postpartum course.  She is ambulating, tolerating a regular diet, passing flatus, and urinating well. Patient is discharged home in stable condition on 09/28/20.  Newborn Data: Birth date:09/27/2020  Birth time:10:06 AM  Gender:Female  Living status:Living  Apgars:8 ,9  Weight:4074 g   Magnesium Sulfate received: No BMZ received: No Rhophylac:N/A MMR:N/A T-DaP:Given prenatally Flu: No Transfusion:No  Physical exam  Vitals:   09/27/20 1600 09/27/20 2030 09/28/20 0015 09/28/20 0540  BP: 126/72 120/73 136/79 122/79  Pulse: 98 (!) 105 (!) 103 96  Resp: _0 Temp: 98.2 F (36.8 C) 98.9 F (37.2 C) 98.2 F (36.8 C) 98 F (36.7 C)  TempSrc: Oral Oral Oral Oral   SpO2:  98% 98%   Weight:      Height:       General: alert, cooperative, and no distress Lochia: appropriate Uterine Fundus: firm Incision: N/A DVT Evaluation: No evidence of DVT seen on physical exam. No cords or calf tenderness. No significant calf/ankle edema. Labs: Lab Results  Component Value Date   WBC 9.0 09/27/2020   HGB 12.6 09/27/2020   HCT 37.0 09/27/2020   MCV 81.0 09/27/2020   PLT 218 09/27/2020   CMP Latest Ref Rng & Units 09/27/2020  Glucose 70 - 99 mg/dL 90  BUN 6 - 20 mg/dL 10  Creatinine 0.44 - 1.00 mg/dL 0.54  Sodium 135 - 145 mmol/L 134(L)  Potassium 3.5 - 5.1 mmol/L 4.5  Chloride 98 - 111 mmol/L 107  CO2 22 - 32 mmol/L 18(L)  Calcium 8.9 - 10.3 mg/dL 8.8(L)  Total Protein 6.5 - 8.1 g/dL 5.9(L)  Total Bilirubin 0.3 - 1.2 mg/dL 0.4  Alkaline Phos 38 - 126 U/L 159(H)  AST 15 - 41 U/L 31  ALT 0 - 44 U/L 7   Edinburgh Score: Edinburgh Postnatal Depression Scale Screening Tool 09/28/2020  I have been able to laugh and see the funny side of things. 0  I have looked forward with enjoyment to things. 0  I have blamed myself unnecessarily when things went wrong. 1  I have been anxious or worried for no good reason. 0  I have felt scared or panicky for no good reason. 0  Things have been getting on top of me. 0  I have been so unhappy that I have had difficulty sleeping. 0  I have felt sad or miserable. 0  I have been so unhappy that I have been crying. 0  The thought of harming myself has occurred to me. 0  Edinburgh Postnatal Depression Scale Total 1     After visit meds:  Allergies as of 09/28/2020   No Known Allergies      Medication List     TAKE these medications    acetaminophen 325 MG tablet Commonly known as: Tylenol Take 2 tablets (650 mg total) by mouth every 6 (six) hours as needed for mild pain, moderate pain, fever or headache (for pain scale < 4).   Blood Pressure Kit Devi 1 kit by Does not apply route as needed.   coconut  oil Oil Apply 1 application topically as needed (nipple pain).   ferrous sulfate 325 (65 FE) MG tablet Take 1 tablet (325 mg total) by mouth every other day.   ibuprofen 600 MG tablet Commonly known as: ADVIL Take 1 tablet (600 mg total) by mouth every 6 (six) hours.   multivitamin-prenatal 27-0.8 MG Tabs tablet Take 1 tablet by mouth daily at 12 noon.         Discharge home in stable condition Infant Feeding: Breast Infant Disposition:home with mother Discharge instruction: per After Visit Summary and Postpartum booklet. Activity: Advance as tolerated. Pelvic rest for 6 weeks.  Diet: routine diet Future Appointments: Future Appointments  Date Time Provider [place]  10/25/2020  2:00 PM [place] Mode, MD [place] None   Follow up Visit:  [place] Follow up on 10/25/2020.   Specialty: Obstetrics and Gynecology Why: for postpartum checkup Contact information: [address], [place] [phone]                Please schedule this patient for a In person postpartum visit in 4 weeks with the following provider: Any provider. Additional Postpartum F/U:BP check 1 week  Low risk pregnancy complicated by:  none Delivery mode:  Vaginal, Spontaneous  Anticipated Birth Control:  [name], Gildardo Cranker, MD OB Fellow, Faculty [facility] 09/28/2020 1:29 PM

## 2020-09-27 NOTE — Plan of Care (Signed)
  Problem: Education: Goal: Knowledge of Childbirth will improve Outcome: Progressing Goal: Ability to make informed decisions regarding treatment and plan of care will improve Outcome: Progressing Goal: Ability to state and carry out methods to decrease the pain will improve Outcome: Progressing   Problem: Coping: Goal: Ability to verbalize concerns and feelings about labor and delivery will improve Outcome: Progressing   

## 2020-09-27 NOTE — H&P (Signed)
OBSTETRIC ADMISSION HISTORY AND PHYSICAL  [name] is a 35 y.o. female G3P2002 with IUP at 74w1dby LMP presenting for SOL/SROM @0300 . She reports +FMs, no VB, no blurry vision, headaches or peripheral edema, and RUQ pain.  She plans on breast feeding. She declines birth control. [facility] --->  Estimated Date of Delivery: 10/03/20  Sono:    09/06/20@[redacted]w[redacted]d , CWD, normal anatomy, cephalic presentation, posterior placental lie, 3506g, 97% EFW   Prenatal History/Complications:  LGA (EFW @[redacted]w[redacted]d  3506g, 97%ile) VSD in patient as baby (fetal echo nml) Velamentous cord insert Intermediate allele for fragile X Elevated BP without diagnosis  Past Medical History: Past Medical History:  Diagnosis Date   Depression    GERD (gastroesophageal reflux disease)    Hx of percutaneous transcatheter closure of congenital VSD    Migraine headache with aura    Panic attack     Past Surgical History: Past Surgical History:  Procedure Laterality Date   CARDIAC SURGERY     as a baby   TONSILLECTOMY      Obstetrical History: OB History     Gravida  3   Para  2   Term  2   Preterm      AB      Living  2      SAB      IAB      Ectopic      Multiple      Live Births  2           Social History Social History   Socioeconomic History   Marital status: Divorced    Spouse name: Not on file   Number of children: Not on file   Years of education: Not on file   Highest education level: Not on file  Occupational History   Not on file  Tobacco Use   Smoking status: Former    Pack years: 0.00    Types: Cigarettes    Quit date: 08/16/2010    Years since quitting: 10.1   Smokeless tobacco: Never  Vaping Use   Vaping Use: Never used  Substance and Sexual Activity   Alcohol use: Not Currently    Comment: last drink drink early September   Drug use: No   Sexual activity: Yes    Partners: Male    Birth  control/protection: None  Other Topics Concern   Not on file  Social History Narrative   Not on file   Social Determinants of Health   Financial Resource Strain: Not on file  Food Insecurity: No Food Insecurity   Worried About RCharity fundraiserin the Last Year: Never true   Ran Out of Food in the Last Year: Never true  Transportation Needs: No Transportation Needs   Lack of Transportation (Medical): No   Lack of Transportation (Non-Medical): No  Physical Activity: Not on file  Stress: Not on file  Social Connections: Not on file    Family History: Family History  Problem Relation Age of Onset   Congestive Heart Failure Other    Asthma Paternal Aunt    Congestive Heart Failure Paternal Uncle    Cancer Paternal Grandmother        breast   Stroke Paternal Grandfather    Asthma Cousin    Healthy Mother    Hypertension Father    Diabetes Father     Allergies: No Known Allergies  Medications Prior  to Admission  Medication Sig Dispense Refill Last Dose   Prenatal Vit-Fe Fumarate-FA (MULTIVITAMIN-PRENATAL) 27-0.8 MG TABS tablet Take 1 tablet by mouth daily at 12 noon.   Past Week   Blood Pressure Monitoring (BLOOD PRESSURE KIT) DEVI 1 kit by Does not apply route as needed. 1 each 0      Review of Systems   All systems reviewed and negative except as stated in HPI  Blood pressure 112/61, pulse (!) 105, temperature 97.6 F (36.4 C), temperature source Oral, resp. rate 18, height 5' 2"  (1.575 m), weight 97.5 kg, last menstrual period 12/28/2019, SpO2 95 %. General appearance: alert, cooperative, and no distress Lungs: normal respiratory effort Heart: regular rate and rhythm Abdomen: soft, non-tender Pelvic: as noted below Extremities: Homans sign is negative, no sign of DVT Presentation: cephalic by MAU RN exam Fetal monitoringBaseline: 130 bpm, Variability: Good {> 6 bpm), Accelerations: Reactive, and Decelerations: Absent Uterine activityFrequency: Every 5  minutes Dilation: 5 Effacement (%): 70 Station: -1 Exam by:: Tomasa Hose, RN   Prenatal labs: ABO, Rh: A/Positive/-- (12/08 1125) Antibody: Negative (12/08 1125) Rubella: 1.48 (12/08 1125) RPR: Non Reactive (03/30 0939)  HBsAg: Negative (12/08 1125)  HIV: Non Reactive (03/30 0939)  GBS: Negative/-- (06/03 1125)  2 hr Glucola passed Genetic screening  intermediate allele fragile x, otherwise normal Anatomy US velamentous cord insert, otherwise normal  Prenatal Transfer Tool  Maternal Diabetes: No Genetic Screening: Abnormal:  Results: Other:intermediate fragile x, otherwise normal Maternal Ultrasounds/Referrals: Fetal Kidney Anomalies Fetal Ultrasounds or other Referrals:  Fetal echo Maternal Substance Abuse:  No Significant Maternal Medications:  None Significant Maternal Lab Results: Group B Strep negative  Results for orders placed or performed during the hospital encounter of 09/27/20 (from the past 24 hour(s))  POCT fern test   Collection Time: 09/27/20  4:29 AM  Result Value Ref Range   POCT Fern Test Positive = ruptured amniotic membanes     Patient Active Problem List   Diagnosis Date Noted   Encounter for elective induction of labor 09/27/2020   Elevated blood pressure affecting pregnancy in third trimester, antepartum 09/27/2020   VSD (ventricular septal defect) 06/06/2020   Fragile x chromosome 06/06/2020   Velamentous insertion of umbilical cord 06/77/0340   Supervision of other normal pregnancy, antepartum 02/01/2020   Migraine headache with aura    Abnormal pregnancy [place] 12/03/2012   GERD 12/01/2006    Assessment/Plan:  [name] is a 35 y.o. G3P2002 at 47w1dhere for SOL/SROM @0300 .  #Labor:Continue expectant management. #Pain: PRN #FWB: Cat 1 #ID: GBS neg #MOF: breaset #MOC:declines #Circ: declines #Elevated BP: no diagnosis, 152/96 on admit, improved on re-check now SBP 130s. No prior diagnosis. preE labs pending. #Velamentous cord  insert: placenta to path  AArrie Senate MD  09/27/2020, 4:53 AM

## 2020-09-27 NOTE — Lactation Note (Signed)
This note was copied from a baby's chart. Lactation Consultation Note  Patient Name: [name] PIRJJ'O Date: 09/27/2020 Reason for consult: Initial assessment;Mother's request;Difficult latch;1st time breastfeeding;Term Age:34 hours  Infant has a labial and lingual attachment with a high palate. With suck training able to feel tongue at the base of my finger. LC able to latch infant in football with tea cup hold for 15 minutes with signs of milk transfer.   Mom given breast shells to wear not pumping sleeping or nursing. Parts, assembly, usage and cleaning reviewed.   Mom to pre pump for 5-10 minutes before latching.   Plan 1. To feed based on cues 8-12x in 24 hr period, no more than 4 hrs without an attempt. Mom to offer both breasts, breast compression and tea cup hold.            2 if unable to latch Mom to give EBM via spoon feeding (5-49ml)          3. I and O sheet reviewed.            4. LC brochure of inpatient and outpatient services reviewed.   All questions answered at the end of the visit.   Maternal Data Has patient been taught Hand Expression?: Yes Does the patient have breastfeeding experience prior to this delivery?: No  Feeding Mother's Current Feeding Choice: Breast Milk  LATCH Score Latch: Repeated attempts needed to sustain latch, nipple held in mouth throughout feeding, stimulation needed to elicit sucking reflex.  Audible Swallowing: Spontaneous and intermittent  Type of Nipple: Flat  Comfort (Breast/Nipple): Soft / non-tender  Hold (Positioning): Assistance needed to correctly position infant at breast and maintain latch.  LATCH Score: 7   Lactation Tools Discussed/Used Tools: Pump;Flanges;Shells Flange Size: 24 Breast pump type: Manual Pump Education: Setup, frequency, and cleaning;Milk Storage Reason for Pumping: help elongate her nipples before latching Pumping frequency: pre pump 5-10 minutes before  latching  Interventions Interventions: Breast feeding basics reviewed;Breast compression;Assisted with latch;Adjust position;Hand pump;Skin to skin;Support pillows;Breast massage;Position options;Hand express;Expressed milk;Education;Pre-pump if needed;Shells  Discharge Pump: Personal WIC Program: No  Consult Status Consult Status: Follow-up Date: 09/28/20 Follow-up type: In-patient    Vaidehi Braddy  Nicholson-Springer 09/27/2020, 4:38 PM

## 2020-09-27 NOTE — Lactation Note (Signed)
This note was copied from a baby's chart. Lactation Consultation Note  Patient Name: [name] XBJYN'W Date: 09/27/2020 Reason for consult: L&D Initial assessment Age:34 hours  LC arrived to L&D. Infant is less that one hour old.  Mother reports that this is her first child to breastfed.  Infant placed in cradle hold after showing mother how to hand express colostrum. Mother reports that she has been leaking colostrum for a while.  Mothers nipples appear flat. She  reports that her nipples's are normally firm and erect.  Infant is showing no rooting cues.  Infant cry's when repositioned. Mother taught to support her breast. The use of tea-cup hold is not effective. Mother's nipple goes flat  and invert's slightly when compressed.  Multiple attempts made to latch infant. Infant took a couple of sucks once. Basic teaching done with mother and support given. Discussed frequent STS and continued hand expression. Mother informed that she would have support of nursing staff and [facility] when she goes to her room on the floor.  Maternal Data Has patient been taught Hand Expression?: Yes Does the patient have breastfeeding experience prior to this delivery?: No  Feeding    LATCH Score Latch: Too sleepy or reluctant, no latch achieved, no sucking elicited.  Audible Swallowing: None  Type of Nipple: Flat (inverts when compressed)  Comfort (Breast/Nipple): Soft / non-tender  Hold (Positioning): Full assist, staff holds infant at breast  LATCH Score: 3   Lactation Tools Discussed/Used    Interventions Interventions: Breast feeding basics reviewed;Assisted with latch;Skin to skin;Hand express;Adjust position;Support pillows  Discharge    Consult Status Consult Status: Follow-up Date: 09/27/20 Follow-up type: In-patient    Stevan Born [facility] 09/27/2020, 11:32 AM

## 2020-09-27 NOTE — MAU Note (Signed)
Pt presents to MAU c/o CTX increasing in intensity since around 0300 am.  Pt c/o LOF (clear) since 0300 also.  Pt states that she is feeling her baby move, denies vaginal bleeding.  Pt denies any complications with the pregnancy.

## 2020-09-28 ENCOUNTER — Ambulatory Visit: Payer: Self-pay

## 2020-09-28 MED ORDER — IBUPROFEN 600 MG PO TABS: 600.0000 mg | ORAL_TABLET | Freq: Four times a day (QID) | ORAL | 0 refills | Status: DC

## 2020-09-28 MED ORDER — COCONUT OIL OIL: 1.0000 "application " | TOPICAL_OIL | 0 refills | Status: DC | PRN

## 2020-09-28 MED ORDER — ACETAMINOPHEN 325 MG PO TABS: 650.0000 mg | ORAL_TABLET | Freq: Four times a day (QID) | ORAL | Status: DC | PRN

## 2020-09-28 MED ORDER — FERROUS SULFATE 325 (65 FE) MG PO TABS: 325.0000 mg | ORAL_TABLET | ORAL | 2 refills | Status: DC

## 2020-09-28 NOTE — Progress Notes (Signed)
Post Partum Day 1 Subjective: no complaints, up ad lib, voiding and tolerating PO, small lochia, plans to breastfeed, no method  Objective: Blood pressure 122/79, pulse 96, temperature 98 F (36.7 C), temperature source Oral, resp. rate 17, height 5\' 2"  (1.575 m), weight 97.5 kg, last menstrual period 12/28/2019, SpO2 98 %, unknown if currently breastfeeding.  Physical Exam:  General: alert, cooperative and no distress Lochia:normal flow Chest: CTAB Heart: RRR no m/r/g Abdomen: +BS, soft, nontender,  Uterine Fundus: firm DVT Evaluation: No evidence of DVT seen on physical exam. Extremities: 1+ edema  Recent Labs    09/27/20 0433  HGB 12.6  HCT 37.0    Assessment/Plan: Plan for discharge tomorrow and Lactation consult   LOS: 1 day   09/29/20 09/28/2020, 8:07 AM

## 2020-09-28 NOTE — Lactation Note (Addendum)
This note was copied from a baby's chart. Lactation Consultation Note  Patient Name: Boy Dorrie Cocuzza XIPJA'S Date: 09/28/2020 Reason for consult: Follow-up assessment Age:35 years Mother is a P3 , Mothers first infant to breastfeed.Mother has semi flat nipple tissue. She has been unable to latch infant . Infant has been spoon fed several times. Mother started offering DBM. Infant has taken 5-15 ml of DBM.  Mother has a hand pump at the bedside. She has been pre pumping and spoon feeding. She also has been prepumping to extend her nipple. Infant suckles a few sucks and then holds her nipple in his mouth.   Mother was fit with a #24 NS, infant has a burst of rhythmic suckling with audible swallows. Infant sustained latch for 25 mins. Infant fit with the #20 NS that seen to fit a little better. Advised mother to keep trying to latch to the bare breast before the NS.  Discussed treatment and prevention of engorgement.  Plan of Care :  Breastfeed infant with feeding cues Supplement infant with ebm/formula, according to supplemental guidelines. Pump using a DEBP after each feeding for 15-20 mins.   Mother to continue to cue base feed infant and feed at least 8-12 times or more in 24 hours and advised to allow for cluster feeding infant as needed.   Mother to continue to due STS. Mother is aware of available LC services at [facility], BFSG'S, OP Dept, and phone # for questions or concerns about breastfeeding.  Mother receptive to all teaching and plan of care.    Maternal Data    Feeding Mother's Current Feeding Choice: Breast Milk  LATCH Score Latch: Grasps breast easily, tongue down, lips flanged, rhythmical sucking.  Audible Swallowing: A few with stimulation  Type of Nipple: Everted at rest and after stimulation (erect after pre pumping)  Comfort (Breast/Nipple): Soft / non-tender  Hold (Positioning): Assistance needed to correctly position infant at breast and maintain latch.  LATCH  Score: 8   Lactation Tools Discussed/Used Tools: Nipple Shields Nipple shield size: 20  Interventions Interventions: Assisted with latch;Skin to skin;Hand express;Pre-pump if needed;Breast compression;Adjust position;Support pillows;Position options;Hand pump;Education;Shells  Discharge Discharge Education: Engorgement and breast care;Warning signs for feeding baby;Outpatient recommendation;Outpatient Epic message sent Pump: Personal;Manual (Mother has a DEBP at home and a Hand pump at the hospital)  Consult Status Consult Status: Complete    Michel Bickers 09/28/2020, 3:10 PM

## 2020-09-28 NOTE — Social Work (Signed)
CSW received consult for hx of Depression and Panic Attacks. CSW met with MOB to offer support and complete assessment.    CSW met with MOB at bedside. CSW observed MOB holding and bonding with the infant. FOB at bedside. CSW congratulated MOB and FOB. CSW introduced role and offered MOB privacy. MOB preferred that FOB stay. MOB presented calm, pleasant and receptive to [place] visit. CSW inquired how MOB has felt since giving birth. MOB reports feeling good. MOB reports she had some pressure during the pregnancy because of baby's movement but overall, it was fine.   CSW inquired about MOB history of depression and panic attacks. MOB acknowledges her history of depression and panic attacks. MOB reports she was diagnosed again about two years ago with depression, panic attacks and anxiety. MOB reports she took Prozac for a year and stopped in August 2021 when she and her husband were trying to conceive. MOB reports she has not seen her therapist in a few months however her therapist is readily available if needed. CSW inquired about MOB coping skills. MOB reports she uses the coping mechanism she learned in therapy. MOB when she feels anxious, she focuses on being present in the moment and press through her distractions. CSW praised MOB for her coping skills. CSW provided education regarding the baby blues period vs. perinatal mood disorders, discussed treatment and gave resources for mental health follow up if concerns arise.  CSW recommended MOB complete a self-evaluation during the postpartum time period using the New Mom Checklist from Postpartum Progress and encouraged MOB to contact a medical professional if symptoms are noted at any time. MOB knowledgeable of postpartum depression and will reach out to her OBGYN if concerns arise. CSW assessed MOB for safety. MOB denies thoughts of harm to self and others. CSW inquired about MOB supports. MOB acknowledges her spouse, parents, relatives, in laws, friends and co  workers a supports.    CSW provided review of Sudden Infant Death Syndrome (SIDS) precautions and informed MOB no co sleeping with the infant. MOB reports the infant will sleep in a bassinet. CSW inquired if MOB has essential items for the infant. MOB reports she has essential items or the infant. MOB has chosen [facility] for infants follow up however she plans to transfer to [employer] eventually.   CSW identifies no further need for intervention and no barriers to discharge at this time.   Kathrin Greathouse, MSW, LCSW Women's and [place] Worker  [phone] 09/28/2020  11:11 AM

## 2020-09-29 ENCOUNTER — Inpatient Hospital Stay (HOSPITAL_COMMUNITY): Payer: Medicaid Other

## 2020-09-29 ENCOUNTER — Inpatient Hospital Stay (HOSPITAL_COMMUNITY)
Admission: AD | Admit: 2020-09-29 | Payer: Medicaid Other | Source: Home / Self Care | Admitting: Obstetrics & Gynecology

## 2020-10-01 ENCOUNTER — Telehealth: Payer: Self-pay

## 2020-10-01 LAB — SURGICAL PATHOLOGY

## 2020-10-01 NOTE — Telephone Encounter (Signed)
Transition Care Management Unsuccessful Follow-up Telephone Call  Date of discharge and from where:  09/28/2020 from [facility]  Attempts:  1st Attempt  Reason for unsuccessful TCM follow-up call:  Left voice message

## 2020-10-02 NOTE — Telephone Encounter (Signed)
Transition Care Management Unsuccessful Follow-up Telephone Call  Date of discharge and from where:  09/28/2020 from [facility]   Attempts:  2nd Attempt  Reason for unsuccessful TCM follow-up call:  Left voice message

## 2020-10-03 ENCOUNTER — Inpatient Hospital Stay (HOSPITAL_COMMUNITY): Admit: 2020-10-03 | Payer: Self-pay

## 2020-10-05 ENCOUNTER — Telehealth: Payer: Medicaid Other | Admitting: Physician Assistant

## 2020-10-05 DIAGNOSIS — K1120 Sialoadenitis, unspecified: Secondary | ICD-10-CM

## 2020-10-05 NOTE — Progress Notes (Signed)
E-Visit for Mouth Ulcers  We are sorry that you are not feeling well.  Here is how we plan to help!  Based on what you have shared with me, it appears that you do have sialadenitis. This is a swollen and painful salivary gland. Sometimes this can be caused by small salivary stones blocking the salivary ducts.      The following medications should decrease the discomfort and help with healing. Biotene mouthwash 3 times daily (Available over the counter) and Orajel (Available over the counter) You can also use sour candies (warheads, etc) or lemon juice to activate the salivary glands as well.   Mouth ulcers are painful areas in the mouth and gums. These are also known as "canker sores".  They can occur anywhere inside the mouth. While mostly harmless, mouth ulcers can be extremely uncomfortable and may make it difficult to eat, drink, and brush your teeth.  You may have more than 1 ulcer and they can vary and change in size. Mouth ulcers are not contagious and should not be confused with cold sores.  Cold sores appear on the lip or around the outside of the mouth and often begin with a tingling, burning or itching sensation.   While the exact causes are unknown, some common causes and factors that may aggravate mouth ulcers include: Genetics - Sometimes mouth ulcers run in families High alcohol intake Acidic foods such as citrus fruits like pineapple, grapefruit, orange fruits/juices, may aggravate mouth ulcers Other foods high in acidity or spice such as coffee, chocolate, chips, pretzels, eggs, nuts, cheese Quitting smoking Injury caused by biting the tongue or inside of the cheek Diet lacking in B-12, zinc, folic acid or iron Female hormone shifts with menstruation Excessive fatigue, emotional stress or anxiety Prevention: Talk to your doctor if you are taking meds that are known to cause mouth ulcers such as:   Anti-inflammatory drugs (for example Ibuprofen, Naproxen sodium), pain killers,  Beta blockers, Oral nicotine replacement drugs, Some street drugs (heroin).   Avoid allowing any tablets to dissolve in your mouth that are meant to swallowed whole Avoid foods/drinks that trigger or worsen symptoms Keep your mouth clean with daily brushing and flossing  Home Care: The goal with treatment is to ease the pain where ulcers occur and help them heal as quickly as possible.  There is no medical treatment to prevent mouth ulcers from coming back or recurring.  Avoid spicy and acidic foods Eat soft foods and avoid rough, crunchy foods Avoid chewing gum Do not use toothpaste that contains sodium lauryl sulphite Use a straw to drink which helps avoid liquids toughing the ulcers near the front of your mouth Use a very soft toothbrush If you have dentures or dental hardware that you feel is not fitting well or contributing to his, please see your dentist. Use saltwater mouthwash which helps healing. Dissolve a  teaspoon of salt in a glass of warm water. Swish around your mouth and spit it out. This can be used as needed if it is soothing.   GET HELP RIGHT AWAY IF: Persistent ulcers require checking IN PERSON (face to face). Any mouth lesion lasting longer than a month should be seen by your DENTIST as soon as possible for evaluation for possible oral cancer. If you have a non-painful ulcer in 1 or more areas of your mouth Ulcers that are spreading, are very large or particularly painful Ulcers last longer than one week without improving on treatment If you develop a  fever, swollen glands and begin to feel unwell Ulcers that developed after starting a new medication MAKE SURE YOU: Understand these instructions. Will watch your condition. Will get help right away if you are not doing well or get worse.  Thank you for choosing an e-visit.  Your e-visit answers were reviewed by a board certified advanced clinical practitioner to complete your personal care plan. Depending upon the  condition, your plan could have included both over the counter or prescription medications.  Please review your pharmacy choice. Make sure the pharmacy is open so you can pick up prescription now. If there is a problem, you may contact your provider through [employer] and have the prescription routed to another pharmacy.  Your safety is important to [place]. If you have drug allergies check your prescription carefully.   For the next 24 hours you can use MyChart to ask questions about today's visit, request a non-urgent call back, or ask for a work or school excuse. You will get an email in the next two days asking about your experience. I hope that your e-visit has been valuable and will speed your recovery.  I provided 7 minutes of non face-to-face time during this encounter for chart review and documentation.

## 2020-10-06 ENCOUNTER — Telehealth: Payer: Medicaid Other | Admitting: Nurse Practitioner

## 2020-10-06 DIAGNOSIS — K047 Periapical abscess without sinus: Secondary | ICD-10-CM

## 2020-10-06 DIAGNOSIS — K0889 Other specified disorders of teeth and supporting structures: Secondary | ICD-10-CM

## 2020-10-06 MED ORDER — AMOXICILLIN 500 MG PO CAPS: 500.0000 mg | ORAL_CAPSULE | Freq: Three times a day (TID) | ORAL | 0 refills | Status: AC

## 2020-10-06 NOTE — Progress Notes (Signed)
E-Visit for Dental Pain  We are sorry that you are not feeling well.  Here is how we plan to help!  Based on what you have shared with me in the questionnaire, it sounds like you have a cracked filling that has lead to an infection around your tooth. We will go ahead and start you on an antibiotic while you are waiting to see the dentist.    Amoxicillin 500mg  3 times per day for 10 days  It is imperative that you see a dentist within 10 days of this eVisit to determine the cause of the dental pain and be sure it is adequately treated  A toothache or tooth pain is caused when the nerve in the root of a tooth or surrounding a tooth is irritated. Dental (tooth) infection, decay, injury, or loss of a tooth are the most common causes of dental pain. Pain may also occur after an extraction (tooth is pulled out). Pain sometimes originates from other areas and radiates to the jaw, thus appearing to be tooth pain.Bacteria growing inside your mouth can contribute to gum disease and dental decay, both of which can cause pain. A toothache occurs from inflammation of the central portion of the tooth called pulp. The pulp contains nerve endings that are very sensitive to pain. Inflammation to the pulp or pulpitis may be caused by dental cavities, trauma, and infection.    HOME CARE:   For toothaches: Over-the-counter pain medications such as acetaminophen or ibuprofen may be used. Take these as directed on the package while you arrange for a dental appointment. Avoid very cold or hot foods, because they may make the pain worse. You may get relief from biting on a cotton ball soaked in oil of cloves. You can get oil of cloves at most drug stores.  For jaw pain:  Aspirin may be helpful for problems in the joint of the jaw in adults. If pain happens every time you open your mouth widely, the temporomandibular joint (TMJ) may be the source of the pain. Yawning or taking a large bite of food may worsen the pain.  An appointment with your doctor or dentist will help you find the cause.     GET HELP RIGHT AWAY IF:  You have a high fever or chills If you have had a recent head or face injury and develop headache, light headedness, nausea, vomiting, or other symptoms that concern you after an injury to your face or mouth, you could have a more serious injury in addition to your dental injury. A facial rash associated with a toothache: This condition may improve with medication. Contact your doctor for them to decide what is appropriate. Any jaw pain occurring with chest pain: Although jaw pain is most commonly caused by dental disease, it is sometimes referred pain from other areas. People with heart disease, especially people who have had stents placed, people with diabetes, or those who have had heart surgery may have jaw pain as a symptom of heart attack or angina. If your jaw or tooth pain is associated with lightheadedness, sweating, or shortness of breath, you should see a doctor as soon as possible. Trouble swallowing or excessive pain or bleeding from gums: If you have a history of a weakened immune system, diabetes, or steroid use, you may be more susceptible to infections. Infections can often be more severe and extensive or caused by unusual organisms. Dental and gum infections in people with these conditions may require more aggressive treatment. An  abscess may need draining or IV antibiotics, for example.  MAKE SURE YOU   Understand these instructions. Will watch your condition. Will get help right away if you are not doing well or get worse.  Meds ordered this encounter  Medications   amoxicillin (AMOXIL) 500 MG capsule    Sig: Take 1 capsule (500 mg total) by mouth 3 (three) times daily for 10 days.    Dispense:  30 capsule    Refill:  0    Thank you for choosing an e-visit.   I spent approximately 7 minutes reviewing the patient's record and coordinating her care today.  Your e-visit  answers were reviewed by a board certified advanced clinical practitioner to complete your personal care plan. Depending upon the condition, your plan could have included both over the counter or prescription medications.  Please review your pharmacy choice. Make sure the pharmacy is open so you can pick up prescription now. If there is a problem, you may contact your provider through [employer] and have the prescription routed to another pharmacy.  Your safety is important to [place]. If you have drug allergies check your prescription carefully.   For the next 24 hours you can use MyChart to ask questions about today's visit, request a non-urgent call back, or ask for a work or school excuse. You will get an email in the next two days asking about your experience. I hope that your e-visit has been valuable and will speed your recovery.

## 2020-10-09 ENCOUNTER — Telehealth (HOSPITAL_COMMUNITY): Payer: Self-pay

## 2020-10-09 NOTE — Telephone Encounter (Signed)
No answer, left voicemail.   Marcelino Duster [facility] 10/09/2020,1422

## 2020-10-17 NOTE — Telephone Encounter (Signed)
Message left 10/09/2020

## 2020-10-25 ENCOUNTER — Other Ambulatory Visit: Payer: Self-pay

## 2020-10-25 ENCOUNTER — Encounter: Payer: Self-pay | Admitting: Obstetrics & Gynecology

## 2020-10-25 ENCOUNTER — Ambulatory Visit (INDEPENDENT_AMBULATORY_CARE_PROVIDER_SITE_OTHER): Payer: Medicaid Other | Admitting: Obstetrics & Gynecology

## 2020-10-25 NOTE — Progress Notes (Signed)
    Post Partum Visit Note  [name] is a 35 y.o. G35P3003 female who presents for a postpartum visit. She is 4 week postpartum following a normal spontaneous vaginal delivery.  I have fully reviewed the prenatal and intrapartum course. The delivery was at [redacted]w[redacted]d gestational weeks.  Anesthesia: none. Postpartum course has been unremarkable. Baby is doing well. Baby is feeding by breast. Bleeding staining only. Bowel function is normal. Bladder function is normal. Patient is not sexually active. Contraception method is condoms. Postpartum depression screening: negative. EPDS: 0   The pregnancy intention screening data noted above was reviewed. Potential methods of contraception were discussed. The patient elected to proceed with No data recorded.     The following portions of the patient's history were reviewed and updated as appropriate: allergies, current medications, past family history, past medical history, past social history, past surgical history, and problem list.  Review of Systems Pertinent items are noted in HPI.    Objective:  LMP 12/28/2019    General:  alert, cooperative, and no distress   Breasts:  deferred  Lungs: Effort normal  Heart:  RRR  Abdomen: soft, non-tender; bowel sounds normal; no masses,  no organomegaly   Vulva:  not evaluated  Vagina: not evaluated                    Assessment:    normal postpartum exam. Pap smear not done at today's visit.   Plan:   Essential components of care per ACOG recommendations:  1.  Mood and well being: Patient with negative depression screening today. Reviewed local resources for support.  - Patient does not use tobacco.  - hx of drug use? No    2. Infant care and feeding:  -Patient currently breastmilk feeding? Yes If breastmilk feeding discussed return to work and pumping. If needed, patient was provided letter for work to allow for every 2-3 hr pumping breaks, and to be granted a private location to  express breastmilk and refrigerated area to store breastmilk. Reviewed importance of draining breast regularly to support lactation. -Social determinants of health (SDOH) reviewed in EPIC. No concerns  3. Sexuality, contraception and birth spacing - Patient does not want a pregnancy in the next year.  Desired family size is 3 children.  - Reviewed forms of contraception in tiered fashion. Patient desired condoms today.   - Discussed birth spacing of 18 months  4. Sleep and fatigue -Encouraged family/partner/community support of 4 hrs of uninterrupted sleep to help with mood and fatigue  5. Physical Recovery  - Discussed patients delivery - Patient had a 0 degree laceration, perineal healing reviewed. Patient expressed understanding - Patient has urinary incontinence? No  - Patient is safe to resume physical and sexual activity  6.  Health Maintenance - Last pap smear done 06/2018 and was normal with negative HPV. 7. Chronic Disease- migraine - PCP follow up Adam Phenix, MD  Center for [facility], [facility]

## 2020-12-29 ENCOUNTER — Telehealth: Payer: Medicaid Other | Admitting: Nurse Practitioner

## 2020-12-29 DIAGNOSIS — K0889 Other specified disorders of teeth and supporting structures: Secondary | ICD-10-CM | POA: Diagnosis not present

## 2020-12-29 MED ORDER — AMOXICILLIN 500 MG PO CAPS: 500.0000 mg | ORAL_CAPSULE | Freq: Three times a day (TID) | ORAL | 0 refills | Status: AC

## 2020-12-29 NOTE — Progress Notes (Signed)
E-Visit for Dental Pain  We are sorry that you are not feeling well.  Here is how we plan to help!  Based on what you have shared with me in the questionnaire, it sounds like you have an infection related to tooth decay.   Amoxicillin 500mg  3 times per day for 10 days  It is imperative that you see a dentist within 10 days of this eVisit to determine the cause of the dental pain and be sure it is adequately treated  A toothache or tooth pain is caused when the nerve in the root of a tooth or surrounding a tooth is irritated. Dental (tooth) infection, decay, injury, or loss of a tooth are the most common causes of dental pain. Pain may also occur after an extraction (tooth is pulled out). Pain sometimes originates from other areas and radiates to the jaw, thus appearing to be tooth pain.Bacteria growing inside your mouth can contribute to gum disease and dental decay, both of which can cause pain. A toothache occurs from inflammation of the central portion of the tooth called pulp. The pulp contains nerve endings that are very sensitive to pain. Inflammation to the pulp or pulpitis may be caused by dental cavities, trauma, and infection.    HOME CARE:   For toothaches: Over-the-counter pain medications such as acetaminophen or ibuprofen may be used. Take these as directed on the package while you arrange for a dental appointment. Avoid very cold or hot foods, because they may make the pain worse. You may get relief from biting on a cotton ball soaked in oil of cloves. You can get oil of cloves at most drug stores.  For jaw pain:  Aspirin may be helpful for problems in the joint of the jaw in adults. If pain happens every time you open your mouth widely, the temporomandibular joint (TMJ) may be the source of the pain. Yawning or taking a large bite of food may worsen the pain. An appointment with your doctor or dentist will help you find the cause.     GET HELP RIGHT AWAY IF:  You have a  high fever or chills If you have had a recent head or face injury and develop headache, light headedness, nausea, vomiting, or other symptoms that concern you after an injury to your face or mouth, you could have a more serious injury in addition to your dental injury. A facial rash associated with a toothache: This condition may improve with medication. Contact your doctor for them to decide what is appropriate. Any jaw pain occurring with chest pain: Although jaw pain is most commonly caused by dental disease, it is sometimes referred pain from other areas. People with heart disease, especially people who have had stents placed, people with diabetes, or those who have had heart surgery may have jaw pain as a symptom of heart attack or angina. If your jaw or tooth pain is associated with lightheadedness, sweating, or shortness of breath, you should see a doctor as soon as possible. Trouble swallowing or excessive pain or bleeding from gums: If you have a history of a weakened immune system, diabetes, or steroid use, you may be more susceptible to infections. Infections can often be more severe and extensive or caused by unusual organisms. Dental and gum infections in people with these conditions may require more aggressive treatment. An abscess may need draining or IV antibiotics, for example.  MAKE SURE YOU   Understand these instructions. Will watch your condition. Will get help  right away if you are not doing well or get worse.  Thank you for choosing an e-visit.  Your e-visit answers were reviewed by a board certified advanced clinical practitioner to complete your personal care plan. Depending upon the condition, your plan could have included both over the counter or prescription medications.  Please review your pharmacy choice. Make sure the pharmacy is open so you can pick up prescription now. If there is a problem, you may contact your provider through [employer] and have the  prescription routed to another pharmacy.  Your safety is important to [place]. If you have drug allergies check your prescription carefully.   For the next 24 hours you can use MyChart to ask questions about today's visit, request a non-urgent call back, or ask for a work or school excuse. You will get an email in the next two days asking about your experience. I hope that your e-visit has been valuable and will speed your recovery.   I spent approximately 7 minutes reviewing the patient's history, current symptoms and coordinating their plan of care today.    Meds ordered this encounter  Medications   amoxicillin (AMOXIL) 500 MG capsule    Sig: Take 1 capsule (500 mg total) by mouth 3 (three) times daily for 10 days.    Dispense:  30 capsule    Refill:  0

## 2021-02-14 ENCOUNTER — Telehealth: Payer: Medicaid Other | Admitting: Physician Assistant

## 2021-02-14 ENCOUNTER — Encounter (HOSPITAL_COMMUNITY): Payer: Self-pay | Admitting: Emergency Medicine

## 2021-02-14 ENCOUNTER — Other Ambulatory Visit: Payer: Self-pay

## 2021-02-14 ENCOUNTER — Ambulatory Visit (HOSPITAL_COMMUNITY)
Admission: EM | Admit: 2021-02-14 | Discharge: 2021-02-14 | Disposition: A | Discharge status: Home / Self Care | Payer: Medicaid Other | Attending: Emergency Medicine | Admitting: Emergency Medicine

## 2021-02-14 DIAGNOSIS — J029 Acute pharyngitis, unspecified: Secondary | ICD-10-CM

## 2021-02-14 DIAGNOSIS — H9203 Otalgia, bilateral: Secondary | ICD-10-CM | POA: Diagnosis not present

## 2021-02-14 DIAGNOSIS — J019 Acute sinusitis, unspecified: Secondary | ICD-10-CM | POA: Diagnosis not present

## 2021-02-14 DIAGNOSIS — B9789 Other viral agents as the cause of diseases classified elsewhere: Secondary | ICD-10-CM | POA: Diagnosis not present

## 2021-02-14 MED ORDER — FLUTICASONE PROPIONATE 50 MCG/ACT NA SUSP: 1.0000 | Freq: Every day | NASAL | 2 refills | Status: DC

## 2021-02-14 MED ORDER — CETIRIZINE HCL 1 MG/ML PO SOLN: 5.0000 mg | Freq: Every day | ORAL | 0 refills | Status: DC

## 2021-02-14 MED ORDER — IPRATROPIUM BROMIDE 0.03 % NA SOLN: 2.0000 | Freq: Two times a day (BID) | NASAL | 0 refills | Status: DC

## 2021-02-14 NOTE — ED Triage Notes (Signed)
Pt presents with facial pain, sore throat when swallowing and ear pain since yesterday.

## 2021-02-14 NOTE — Discharge Instructions (Signed)
Take one spray of Flonase each side. Take 5 mg of Zyrtec once daily.

## 2021-02-14 NOTE — Progress Notes (Signed)

## 2021-02-14 NOTE — ED Provider Notes (Signed)
[place]  ____________________________________________  Time seen: Approximately 9:01 AM  I have reviewed the triage vital signs and the nursing notes.   HISTORY  Chief Complaint Facial Pain, Sore Throat, and Otalgia   Historian Patient     HPI [name] is a 35 y.o. female presents to the emergency department with bilateral otalgia and some mild pharyngitis.  Patient has also had some nasal congestion.  She has been afebrile.  No chest pain, chest tightness or abdominal pain.   Past Medical History:  Diagnosis Date   Depression    GERD (gastroesophageal reflux disease)    Hx of percutaneous transcatheter closure of congenital VSD    Migraine headache with aura    Panic attack      Immunizations up to date:  Yes.     Past Medical History:  Diagnosis Date   Depression    GERD (gastroesophageal reflux disease)    Hx of percutaneous transcatheter closure of congenital VSD    Migraine headache with aura    Panic attack     Patient Active Problem List   Diagnosis Date Noted   Migraine headache with aura     Past Surgical History:  Procedure Laterality Date   CARDIAC SURGERY     as a baby   TONSILLECTOMY      Prior to Admission medications   Medication Sig Start Date End Date Taking? Authorizing Provider  cetirizine HCl (ZYRTEC) 1 MG/ML solution Take 5 mLs (5 mg total) by mouth daily. 02/14/21  Yes Vallarie Mare M, PA-C  fluticasone (FLONASE) 50 MCG/ACT nasal spray Place 1 spray into both nostrils daily for 7 days. 02/14/21 02/21/21 Yes Vallarie Mare M, PA-C  acetaminophen (TYLENOL) 325 MG tablet Take 2 tablets (650 mg total) by mouth every 6 (six) hours as needed for mild pain, moderate pain, fever or headache (for pain scale < 4). Patient not taking: Reported on 10/25/2020 09/28/20   Randa Ngo, MD  Blood Pressure Monitoring (BLOOD PRESSURE KIT) DEVI 1 kit by Does not apply route as needed. Patient not taking: Reported on 10/25/2020  02/01/20   Griffin Basil, MD  coconut oil OIL Apply 1 application topically as needed (nipple pain). Patient not taking: Reported on 10/25/2020 09/28/20   Randa Ngo, MD  ferrous sulfate 325 (65 FE) MG tablet Take 1 tablet (325 mg total) by mouth every other day. 09/28/20   Randa Ngo, MD  ibuprofen (ADVIL) 600 MG tablet Take 1 tablet (600 mg total) by mouth every 6 (six) hours. Patient not taking: Reported on 10/25/2020 09/28/20   Randa Ngo, MD  Prenatal Vit-Fe Fumarate-FA (MULTIVITAMIN-PRENATAL) 27-0.8 MG TABS tablet Take 1 tablet by mouth daily at 12 noon.    [provider]    Allergies Patient has no known allergies.  Family History  Problem Relation Age of Onset   Congestive Heart Failure Other    Asthma Paternal Aunt    Congestive Heart Failure Paternal Uncle    Cancer Paternal Grandmother        breast   Stroke Paternal Grandfather    Asthma Cousin    Healthy Mother    Hypertension Father    Diabetes Father     Social History Social History   Tobacco Use   Smoking status: Former    Types: Cigarettes    Quit date: 08/16/2010    Years since quitting: 10.5   Smokeless tobacco: Never  Vaping Use   Vaping Use: Never  used  Substance Use Topics   Alcohol use: Not Currently    Comment: last drink drink early September   Drug use: No     Review of Systems  Constitutional: No fever/chills Eyes:  No discharge ENT: Patient has facial pressure. Respiratory: no cough. No SOB/ use of accessory muscles to breath Gastrointestinal:   No nausea, no vomiting.  No diarrhea.  No constipation. Musculoskeletal: Negative for musculoskeletal pain. Skin: Negative for rash, abrasions, lacerations, ecchymosis.    ____________________________________________   PHYSICAL EXAM:  VITAL SIGNS: ED Triage Vitals  Enc Vitals Group     BP 02/14/21 0837 122/73     Pulse Rate 02/14/21 0837 (!) 106     Resp 02/14/21 0837 17     Temp 02/14/21 0837 98.1 F (36.7  C)     Temp Source 02/14/21 0837 Oral     SpO2 02/14/21 0837 98 %     Weight --      Height --      Head Circumference --      Peak Flow --      Pain Score 02/14/21 0834 5     Pain Loc --      Pain Edu? --      Excl. in [place]? --      Constitutional: Alert and oriented. Well appearing and in no acute distress. Eyes: Conjunctivae are normal. PERRL. EOMI. Head: Atraumatic. ENT:      Ears: TMs are effused bilaterally.      Nose: No congestion/rhinnorhea.      Mouth/Throat: Mucous membranes are moist.  Patient has cobblestoning. Neck: No stridor.  No cervical spine tenderness to palpation. Cardiovascular: Normal rate, regular rhythm. Normal S1 and S2.  Good peripheral circulation. Respiratory: Normal respiratory effort without tachypnea or retractions. Lungs CTAB. Good air entry to the bases with no decreased or absent breath sounds Gastrointestinal: Bowel sounds x 4 quadrants. Soft and nontender to palpation. No guarding or rigidity. No distention. Musculoskeletal: Full range of motion to all extremities. No obvious deformities noted Neurologic:  Normal for age. No gross focal neurologic deficits are appreciated.  Skin:  Skin is warm, dry and intact. No rash noted. Psychiatric: Mood and affect are normal for age. Speech and behavior are normal.   ____________________________________________   LABS (all labs ordered are listed, but only abnormal results are displayed)  Labs Reviewed - No data to display ____________________________________________  EKG   ____________________________________________  RADIOLOGY   No results found.  ____________________________________________    PROCEDURES  Procedure(s) performed:     Procedures     Medications - No data to display   ____________________________________________   INITIAL IMPRESSION / ASSESSMENT AND PLAN / ED COURSE  Pertinent labs & imaging results that were available during my care of the patient were  reviewed by me and considered in my medical decision making (see chart for details).    Assessment and plan:  Seasonal allergies 35 year old female presents to the urgent care with bilateral otalgia and some mild pharyngitis.  Suspect middle ear effusion and postnasal drip secondary to seasonal allergies.  We will start patient on Flonase and Zyrtec.     ____________________________________________  FINAL CLINICAL IMPRESSION(S) / ED DIAGNOSES  Final diagnoses:  Otalgia of both ears  Acute pharyngitis, unspecified etiology      NEW MEDICATIONS STARTED DURING THIS VISIT:  ED Discharge Orders          Ordered    cetirizine HCl (ZYRTEC) 1 MG/ML solution  Daily  02/14/21 0859    fluticasone (FLONASE) 50 MCG/ACT nasal spray  Daily        02/14/21 0859                This chart was dictated using voice recognition software/Dragon. Despite best efforts to proofread, errors can occur which can change the meaning. Any change was purely unintentional.     Lannie Fields, PA-C 02/14/21 [phone]

## 2021-04-07 ENCOUNTER — Telehealth: Payer: Medicaid Other | Admitting: Emergency Medicine

## 2021-04-07 DIAGNOSIS — J069 Acute upper respiratory infection, unspecified: Secondary | ICD-10-CM

## 2021-04-07 MED ORDER — CROMOLYN SODIUM 5.2 MG/ACT NA AERS: 1.0000 | INHALATION_SPRAY | Freq: Four times a day (QID) | NASAL | 12 refills | Status: DC

## 2021-04-07 NOTE — L&D Delivery Note (Signed)
OB/GYN Faculty Practice Delivery Note  [name] is a 36 y.o. [id number] s/p SVD at [redacted]w[redacted]d. She was admitted for SOL.   ROM: 5h 30m with clear fluid GBS Status:  Negative/-- (08/08 1100) Maximum Maternal Temperature: 985F  Labor Progress: Initial SVE: 4/60/-2. Received pitocin. She then progressed to complete.   Delivery Date/Time: 11/17/21 1852 Delivery: Called to room and patient was complete and pushing. Head delivered ROA. No nuchal cord present. Shoulder and body delivered in usual fashion. Infant with spontaneous cry, placed on mother's abdomen, dried and stimulated. Cord clamped x 2 after 1-minute delay, and cut by FOB. Cord blood drawn. Placenta delivered spontaneously with gentle cord traction. Fundus firm with massage and Pitocin. Labia, perineum, vagina, and cervix inspected with no lacerations noted.  Baby Weight: pending  Placenta: 3 vessel, intact. Sent to L&D Complications: None Lacerations: none EBL: 57 mL Analgesia: NONE   Infant:  APGAR (1 MIN): 9   APGAR (5 MINS): 9    Myrtie Hawk, DO OB Family Medicine Fellow, [facility] for [employer], [facility] 11/17/2021, 7:15 PM

## 2021-04-07 NOTE — Progress Notes (Signed)
I have spent 5 minutes in review of e-visit questionnaire, review and updating patient chart, medical decision making and response to patient.   Aletta Edmunds, PA-C    

## 2021-04-07 NOTE — Progress Notes (Signed)
E-Visit for Upper Respiratory Infection   We are sorry you are not feeling well.  Here is how we plan to help!  Based on what you have shared with me, it looks like you may have a viral upper respiratory infection.  Upper respiratory infections are caused by a large number of viruses; however, rhinovirus is the most common cause.   Symptoms vary from person to person, with common symptoms including sore throat, cough, fatigue or lack of energy and feeling of general discomfort.  A low-grade fever of up to 100.4 may present, but is often uncommon.  Symptoms vary however, and are closely related to a person's age or underlying illnesses.  The most common symptoms associated with an upper respiratory infection are nasal discharge or congestion, cough, sneezing, headache and pressure in the ears and face.  These symptoms usually persist for about 3 to 10 days, but can last up to 2 weeks.  It is important to know that upper respiratory infections do not cause serious illness or complications in most cases.    Upper respiratory infections can be transmitted from person to person, with the most common method of transmission being a person's hands.  The virus is able to live on the skin and can infect other persons for up to 2 hours after direct contact.  Also, these can be transmitted when someone coughs or sneezes; thus, it is important to cover the mouth to reduce this risk.  To keep the spread of the illness at bay, good hand hygiene is very important.  This is an infection that is most likely caused by a virus. There are no specific treatments other than to help you with the symptoms until the infection runs its course.  We are sorry you are not feeling well.  Here is how we plan to help!   For nasal congestion, you may use an oral decongestants such as Mucinex D or if you have glaucoma or high blood pressure use plain Mucinex.  Saline nasal spray or nasal drops can help and can safely be used as often as  needed for congestion.  For your congestion, I have prescribed cromolyn sodium nasal spray.  Use 1 spray per nostril up ot 6 times daily.    You may also use only ONE of the following OTC medications: Cetirizine 10 mg daily Loratadine 10 mg daily Fexofenadine 180 mg daily  If you have a sore or scratchy throat, use a saltwater gargle-  to  teaspoon of salt dissolved in a 4-ounce to 8-ounce glass of warm water.  Gargle the solution for approximately 15-30 seconds and then spit.  It is important not to swallow the solution.  You can also use throat lozenges/cough drops and Chloraseptic spray to help with throat pain or discomfort.  Warm or cold liquids can also be helpful in relieving throat pain.  For headache, pain or general discomfort, you can use Tylenol as directed.   Some authorities believe that zinc sprays or the use of Echinacea may shorten the course of your symptoms.   HOME CARE Only take medications as instructed by your medical team. Be sure to drink plenty of fluids. Water is fine as well as fruit juices, sodas and electrolyte beverages. You may want to stay away from caffeine or alcohol. If you are nauseated, try taking small sips of liquids. How do you know if you are getting enough fluid? Your urine should be a pale yellow or almost colorless. Get rest. Taking a  steamy shower or using a humidifier may help nasal congestion and ease sore throat pain. You can place a towel over your head and breathe in the steam from hot water coming from a faucet. Using a saline nasal spray works much the same way. Cough drops, hard candies and sore throat lozenges may ease your cough. Avoid close contacts especially the very young and the elderly Cover your mouth if you cough or sneeze Always remember to wash your hands.   GET HELP RIGHT AWAY IF: You develop worsening fever. If your symptoms do not improve within 10 days You develop yellow or green discharge from your nose over 3  days. You have coughing fits You develop a severe head ache or visual changes. You develop shortness of breath, difficulty breathing or start having chest pain Your symptoms persist after you have completed your treatment plan  MAKE SURE YOU  Understand these instructions. Will watch your condition. Will get help right away if you are not doing well or get worse.  Thank you for choosing an e-visit.  Your e-visit answers were reviewed by a board certified advanced clinical practitioner to complete your personal care plan. Depending upon the condition, your plan could have included both over the counter or prescription medications.  Please review your pharmacy choice. Make sure the pharmacy is open so you can pick up prescription now. If there is a problem, you may contact your provider through [employer] and have the prescription routed to another pharmacy.  Your safety is important to [place]. If you have drug allergies check your prescription carefully.   For the next 24 hours you can use MyChart to ask questions about today's visit, request a non-urgent call back, or ask for a work or school excuse. You will get an email in the next two days asking about your experience. I hope that your e-visit has been valuable and will speed your recovery.

## 2021-04-08 ENCOUNTER — Emergency Department (HOSPITAL_BASED_OUTPATIENT_CLINIC_OR_DEPARTMENT_OTHER)
Admission: EM | Admit: 2021-04-08 | Discharge: 2021-04-08 | Disposition: A | Discharge status: Home / Self Care | Payer: Medicaid Other | Attending: Emergency Medicine | Admitting: Emergency Medicine

## 2021-04-08 ENCOUNTER — Other Ambulatory Visit: Payer: Self-pay

## 2021-04-08 ENCOUNTER — Encounter (HOSPITAL_BASED_OUTPATIENT_CLINIC_OR_DEPARTMENT_OTHER): Payer: Self-pay | Admitting: Obstetrics and Gynecology

## 2021-04-08 DIAGNOSIS — U071 COVID-19: Secondary | ICD-10-CM | POA: Insufficient documentation

## 2021-04-08 DIAGNOSIS — O98511 Other viral diseases complicating pregnancy, first trimester: Secondary | ICD-10-CM | POA: Insufficient documentation

## 2021-04-08 LAB — GROUP A STREP BY PCR: Group A Strep by PCR: NOT DETECTED

## 2021-04-08 LAB — RESP PANEL BY RT-PCR (FLU A&B, COVID) ARPGX2
Influenza A by PCR: NEGATIVE
Influenza B by PCR: NEGATIVE
SARS Coronavirus 2 by RT PCR: POSITIVE — AB

## 2021-04-08 NOTE — ED Notes (Signed)
Patient verbalizes understanding of discharge instructions. Opportunity for questioning and answers were provided. Patient discharged from ED.  °

## 2021-04-08 NOTE — Discharge Instructions (Signed)
You can take tylenol/robitussin DM and cephocal cough drops which are all safe in pregnancy.  If you start having a hard time breathing return to the ER.

## 2021-04-08 NOTE — ED Provider Notes (Signed)
[name] Provider Note   CSN: [id number] Arrival date & time: 04/08/21  1308     History  Chief Complaint  Patient presents with   Sore Throat    [name] is a 36 y.o. female.  Pt is a 36y/o female with hx of GERD, depression and currently [redacted] weeks pregnant presenting today with c/o of 3 days of worsening nasal congestion, chest tightness and discomfort and cough.  1 epsiode of vomiting today while in the shower but no persistent symptoms.  Some sore throat today and woke up in a sweat but unclear if she has had a fever.  No abd pain or vaginal bleeding.  Denies any SOB.  The history is provided by the patient.  Sore Throat      Home Medications Prior to Admission medications   Medication Sig Start Date End Date Taking? Authorizing Provider  cetirizine HCl (ZYRTEC) 1 MG/ML solution Take 5 mLs (5 mg total) by mouth daily. 02/14/21   Lannie Fields, PA-C  cromolyn (NASALCROM) 5.2 MG/ACT nasal spray Place 1 spray into both nostrils 4 (four) times daily. 04/07/21   [place], [place], PA-C  ferrous sulfate 325 (65 FE) MG tablet Take 1 tablet (325 mg total) by mouth every other day. 09/28/20   Randa Ngo, MD  fluticasone (FLONASE) 50 MCG/ACT nasal spray Place 1 spray into both nostrils daily for 7 days. 02/14/21 02/21/21  Lannie Fields, PA-C  Prenatal Vit-Fe Fumarate-FA (MULTIVITAMIN-PRENATAL) 27-0.8 MG TABS tablet Take 1 tablet by mouth daily at 12 noon.    [provider]      Allergies    Patient has no known allergies.    Review of Systems   Review of Systems  All other systems reviewed and are negative.  Physical Exam Updated Vital Signs BP 126/83 (BP Location: Right Arm)    Pulse 93    Temp 97.9 F (36.6 C)    Resp 16    Ht 5\' 2"  (1.575 m)    Wt 88 kg    LMP 01/22/2021    SpO2 99%    Breastfeeding No    BMI 35.48 kg/m  Physical Exam Vitals and nursing note reviewed.  Constitutional:      General: She is not in acute  distress.    Appearance: She is well-developed.  HENT:     Head: Normocephalic and atraumatic.     Right Ear: Tympanic membrane normal.     Left Ear: Tympanic membrane normal.     Nose: Congestion present.     Mouth/Throat:     Mouth: Mucous membranes are moist. No oral lesions.     Pharynx: Oropharynx is clear. No oropharyngeal exudate or posterior oropharyngeal erythema.     Tonsils: No tonsillar exudate.  Eyes:     Pupils: Pupils are equal, round, and reactive to light.  Cardiovascular:     Rate and Rhythm: Normal rate and regular rhythm.     Heart sounds: Normal heart sounds. No murmur heard.   No friction rub.  Pulmonary:     Effort: Pulmonary effort is normal.     Breath sounds: Normal breath sounds. No wheezing or rales.  Abdominal:     General: Bowel sounds are normal. There is no distension.     Palpations: Abdomen is soft.     Tenderness: There is no abdominal tenderness. There is no guarding or rebound.  Musculoskeletal:        General: No tenderness. Normal range  of motion.     Cervical back: Normal range of motion and neck supple.     Comments: No edema  Skin:    General: Skin is warm and dry.     Findings: No rash.  Neurological:     Mental Status: She is alert and oriented to person, place, and time.     Cranial Nerves: No cranial nerve deficit.  Psychiatric:        Behavior: Behavior normal.    ED Results / Procedures / Treatments   Labs (all labs ordered are listed, but only abnormal results are displayed) Labs Reviewed  RESP PANEL BY RT-PCR (FLU A&B, COVID) ARPGX2 - Abnormal; Notable for the following components:      Result Value   SARS Coronavirus 2 by RT PCR POSITIVE (*)    All other components within normal limits  GROUP A STREP BY PCR    EKG None  Radiology No results found.  Procedures Procedures    Medications Ordered in ED Medications - No data to display  ED Course/ Medical Decision Making/ A&P                            Medical Decision Making  Pt with symptoms consistent with viral URI.  Well appearing here.  No signs of breathing difficulty  No signs of pharyngitis, otitis or abnormal abdominal findings.   I reviewed the labs and Strep is neg and flu is neg.  COVID positive.  Sating 100% on RA.  and pt to return with any further problems.         Final Clinical Impression(s) / ED Diagnoses Final diagnoses:  COVID    Rx / DC Orders ED Discharge Orders     None         Blanchie Dessert, MD 04/08/21 [phone]

## 2021-04-08 NOTE — ED Triage Notes (Signed)
Patient reports to the ER for sore throat and sweating in the night. Patient reports she is approximately [redacted] weeks pregnant. Denies abdominal pain or spotting. Patient reports she has her first OB visit coming up soon

## 2021-04-19 ENCOUNTER — Ambulatory Visit (INDEPENDENT_AMBULATORY_CARE_PROVIDER_SITE_OTHER): Payer: Medicaid Other

## 2021-04-19 ENCOUNTER — Other Ambulatory Visit: Payer: Self-pay

## 2021-04-19 ENCOUNTER — Ambulatory Visit (INDEPENDENT_AMBULATORY_CARE_PROVIDER_SITE_OTHER): Payer: Medicaid Other | Admitting: *Deleted

## 2021-04-19 VITALS — BP 117/76 | HR 93 | Wt 197.0 lb

## 2021-04-19 DIAGNOSIS — O3680X0 Pregnancy with inconclusive fetal viability, not applicable or unspecified: Secondary | ICD-10-CM

## 2021-04-19 DIAGNOSIS — Z3401 Encounter for supervision of normal first pregnancy, first trimester: Secondary | ICD-10-CM

## 2021-04-19 DIAGNOSIS — Z3A01 Less than 8 weeks gestation of pregnancy: Secondary | ICD-10-CM

## 2021-04-19 DIAGNOSIS — Z3480 Encounter for supervision of other normal pregnancy, unspecified trimester: Secondary | ICD-10-CM | POA: Insufficient documentation

## 2021-04-19 NOTE — Progress Notes (Signed)
New OB Intake  I explained I am completing New OB Intake today. We discussed her EDD of 12/05/2021 that is based on today's scan. LMP of 02/20/2021. Pt is G4/P3. I reviewed her allergies, medications, Medical/Surgical/OB history, and appropriate screenings. I informed her of [facility] services. Based on history, this is a/an  pregnancy uncomplicated .   Patient Active Problem List   Diagnosis Date Noted   Migraine headache with aura     Concerns addressed today  Delivery Plans:  Plans to deliver at [facility].   Blood Pressure Cuff  Pt has BP cuff   Anatomy US Explained first scheduled [place] will be around 19 weeks.   Labs Discussed Avelina Laine genetic screening with patient. Would like Panorama at new OB visit. Routine prenatal labs needed.   Placed OB Box on problem list and updated   Patient informed that the ultrasound is considered a limited obstetric ultrasound and is not intended to be a complete ultrasound exam.  Patient also informed that the ultrasound is not being completed with the intent of assessing for fetal or placental anomalies or any pelvic abnormalities. Explained that the purpose of today's ultrasound is to assess for dating and fetal heart rate.  Patient acknowledges the purpose of the exam and the limitations of the study.      First visit review I reviewed new OB appt with pt. I explained she will have a pelvic exam, ob bloodwork with genetic screening, and PAP smear. Explained pt will be seen by Dr Shawnie Pons at first visit; encounter routed to appropriate provider. Explained that patient will be seen by pregnancy navigator following visit with provider.     Scheryl Marten, RN 04/19/2021  11:15 AM

## 2021-04-19 NOTE — Progress Notes (Signed)
Patient was assessed and managed by nursing staff during this encounter. I have reviewed the chart and agree with the documentation and plan.  ° °Pooja Camuso, MD °04/19/2021 12:20 PM °

## 2021-05-21 ENCOUNTER — Ambulatory Visit (INDEPENDENT_AMBULATORY_CARE_PROVIDER_SITE_OTHER): Payer: Medicaid Other | Admitting: Family Medicine

## 2021-05-21 ENCOUNTER — Encounter: Payer: Self-pay | Admitting: Family Medicine

## 2021-05-21 ENCOUNTER — Other Ambulatory Visit (HOSPITAL_COMMUNITY)
Admission: RE | Admit: 2021-05-21 | Discharge: 2021-05-21 | Disposition: A | Discharge status: Home / Self Care | Payer: Medicaid Other | Source: Ambulatory Visit | Attending: Family Medicine | Admitting: Family Medicine

## 2021-05-21 ENCOUNTER — Other Ambulatory Visit: Payer: Self-pay

## 2021-05-21 VITALS — BP 125/84 | HR 97 | Wt 195.8 lb

## 2021-05-21 DIAGNOSIS — Z124 Encounter for screening for malignant neoplasm of cervix: Secondary | ICD-10-CM

## 2021-05-21 DIAGNOSIS — Z3480 Encounter for supervision of other normal pregnancy, unspecified trimester: Secondary | ICD-10-CM

## 2021-05-21 DIAGNOSIS — G43109 Migraine with aura, not intractable, without status migrainosus: Secondary | ICD-10-CM

## 2021-05-21 DIAGNOSIS — F32A Depression, unspecified: Secondary | ICD-10-CM

## 2021-05-21 DIAGNOSIS — Z8774 Personal history of (corrected) congenital malformations of heart and circulatory system: Secondary | ICD-10-CM | POA: Insufficient documentation

## 2021-05-21 DIAGNOSIS — F325 Major depressive disorder, single episode, in full remission: Secondary | ICD-10-CM

## 2021-05-21 DIAGNOSIS — F419 Anxiety disorder, unspecified: Secondary | ICD-10-CM | POA: Insufficient documentation

## 2021-05-21 HISTORY — DX: Depression, unspecified: F32.A

## 2021-05-21 NOTE — Patient Instructions (Signed)
First Trimester of Pregnancy °The first trimester of pregnancy starts on the first day of your last menstrual period until the end of week 12. This is months 1 through 3 of pregnancy. A week after a sperm fertilizes an egg, the egg will implant into the wall of the uterus and begin to develop into a baby. By the end of 12 weeks, all the baby's organs will be formed and the baby will be 2-3 inches in size. °Body changes during your first trimester °Your body goes through many changes during pregnancy. The changes vary and generally return to normal after your baby is born. °Physical changes °You may gain or lose weight. °Your breasts may begin to grow larger and become tender. The tissue that surrounds your nipples (areola) may become darker. °Dark spots or blotches (chloasma or mask of pregnancy) may develop on your face. °You may have changes in your hair. These can include thickening or thinning of your hair or changes in texture. °Health changes °You may feel nauseous, and you may vomit. °You may have heartburn. °You may develop headaches. °You may develop constipation. °Your gums may bleed and may be sensitive to brushing and flossing. °Other changes °You may tire easily. °You may urinate more often. °Your menstrual periods will stop. °You may have a loss of appetite. °You may develop cravings for certain kinds of food. °You may have changes in your emotions from day to day. °You may have more vivid and strange dreams. °Follow these instructions at home: °Medicines °Follow your health care provider's instructions regarding medicine use. Specific medicines may be either safe or unsafe to take during pregnancy. Do not take any medicines unless told to by your health care provider. °Take a prenatal vitamin that contains at least 600 micrograms (mcg) of folic acid. °Eating and drinking °Eat a healthy diet that includes fresh fruits and vegetables, whole grains, good sources of protein such as meat, eggs, or tofu,  and low-fat dairy products. °Avoid raw meat and unpasteurized juice, milk, and cheese. These carry germs that can harm you and your baby. °If you feel nauseous or you vomit: °Eat 4 or 5 small meals a day instead of 3 large meals. °Try eating a few soda crackers. °Drink liquids between meals instead of during meals. °You may need to take these actions to prevent or treat constipation: °Drink enough fluid to keep your urine pale yellow. °Eat foods that are high in fiber, such as beans, whole grains, and fresh fruits and vegetables. °Limit foods that are high in fat and processed sugars, such as fried or sweet foods. °Activity °Exercise only as directed by your health care provider. Most people can continue their usual exercise routine during pregnancy. Try to exercise for 30 minutes at least 5 days a week. °Stop exercising if you develop pain or cramping in the lower abdomen or lower back. °Avoid exercising if it is very hot or humid or if you are at high altitude. °Avoid heavy lifting. °If you choose to, you may have sex unless your health care provider tells you not to. °Relieving pain and discomfort °Wear a good support bra to relieve breast tenderness. °Rest with your legs elevated if you have leg cramps or low back pain. °If you develop bulging veins (varicose veins) in your legs: °Wear support hose as told by your health care provider. °Elevate your feet for 15 minutes, 3-4 times a day. °Limit salt in your diet. °Safety °Wear your seat belt at all times when driving   or riding in a car. °Talk with your health care provider if someone is verbally or physically abusive to you. °Talk with your health care provider if you are feeling sad or have thoughts of hurting yourself. °Lifestyle °Do not use hot tubs, steam rooms, or saunas. °Do not douche. Do not use tampons or scented sanitary pads. °Do not use herbal remedies, alcohol, illegal drugs, or medicines that are not approved by your health care provider. Chemicals  in these products can harm your baby. °Do not use any products that contain nicotine or tobacco, such as cigarettes, e-cigarettes, and chewing tobacco. If you need help quitting, ask your health care provider. °Avoid cat litter boxes and soil used by cats. These carry germs that can cause birth defects in the baby and possibly loss of the unborn baby (fetus) by miscarriage or stillbirth. °General instructions °During routine prenatal visits in the first trimester, your health care provider will do a physical exam, perform necessary tests, and ask you how things are going. Keep all follow-up visits. This is important. °Ask for help if you have counseling or nutritional needs during pregnancy. Your health care provider can offer advice or refer you to specialists for help with various needs. °Schedule a dentist appointment. At home, brush your teeth with a soft toothbrush. Floss gently. °Write down your questions. Take them to your prenatal visits. °Where to find more information °American Pregnancy Association: americanpregnancy.org °American College of Obstetricians and Gynecologists: acog.org/en/Womens%20Health/Pregnancy °Office on Women's Health: womenshealth.gov/pregnancy °Contact a health care provider if you have: °Dizziness. °A fever. °Mild pelvic cramps, pelvic pressure, or nagging pain in the abdominal area. °Nausea, vomiting, or diarrhea that lasts for 24 hours or longer. °A bad-smelling vaginal discharge. °Pain when you urinate. °Known exposure to a contagious illness, such as chickenpox, measles, Zika virus, HIV, or hepatitis. °Get help right away if you have: °Spotting or bleeding from your vagina. °Severe abdominal cramping or pain. °Shortness of breath or chest pain. °Any kind of trauma, such as from a fall or a car crash. °New or increased pain, swelling, or redness in an arm or leg. °Summary °The first trimester of pregnancy starts on the first day of your last menstrual period until the end of week  12 (months 1 through 3). °Eating 4 or 5 small meals a day rather than 3 large meals may help to relieve nausea and vomiting. °Do not use any products that contain nicotine or tobacco, such as cigarettes, e-cigarettes, and chewing tobacco. If you need help quitting, ask your health care provider. °Keep all follow-up visits. This is important. °This information is not intended to replace advice given to you by your health care provider. Make sure you discuss any questions you have with your health care provider. °Document Revised: 08/31/2019 Document Reviewed: 07/07/2019 °Elsevier Patient Education © 2022 Elsevier Inc. ° °

## 2021-05-22 LAB — CBC/D/PLT+RPR+RH+ABO+RUBIGG...
Antibody Screen: NEGATIVE
Basophils Absolute: 0 10*3/uL (ref 0.0–0.2)
Basos: 0 %
EOS (ABSOLUTE): 0.1 10*3/uL (ref 0.0–0.4)
Eos: 2 %
HCV Ab: NONREACTIVE
HIV Screen 4th Generation wRfx: NONREACTIVE
Hematocrit: 38.8 % (ref 34.0–46.6)
Hemoglobin: 12.9 g/dL (ref 11.1–15.9)
Hepatitis B Surface Ag: NEGATIVE
Immature Grans (Abs): 0 10*3/uL (ref 0.0–0.1)
Immature Granulocytes: 0 %
Lymphocytes Absolute: 1.7 10*3/uL (ref 0.7–3.1)
Lymphs: 23 %
MCH: 27.6 pg (ref 26.6–33.0)
MCHC: 33.2 g/dL (ref 31.5–35.7)
MCV: 83 fL (ref 79–97)
Monocytes Absolute: 0.5 10*3/uL (ref 0.1–0.9)
Monocytes: 7 %
Neutrophils Absolute: 5.1 10*3/uL (ref 1.4–7.0)
Neutrophils: 68 %
Platelets: 298 10*3/uL (ref 150–450)
RBC: 4.68 x10E6/uL (ref 3.77–5.28)
RDW: 13.8 % (ref 11.7–15.4)
RPR Ser Ql: NONREACTIVE
Rh Factor: POSITIVE
Rubella Antibodies, IGG: 1.47 index (ref >0.99)
WBC: 7.5 10*3/uL (ref 3.4–10.8)

## 2021-05-22 LAB — HEMOGLOBIN A1C
Est. average glucose Bld gHb Est-mCnc: 105 mg/dL
Hgb A1c MFr Bld: 5.3 % (ref 4.8–5.6)

## 2021-05-22 LAB — HCV INTERPRETATION

## 2021-05-22 LAB — TSH: TSH: 1.94 u[IU]/mL (ref 0.450–4.500)

## 2021-05-22 NOTE — Progress Notes (Addendum)
Subjective:   [name] is a 36 y.o. [phone] at [redacted]w[redacted]d by early ultrasound being seen today for her first obstetrical visit.  Her obstetrical history is significant for obesity. Patient does not intend to breast feed. Pregnancy history fully reviewed.  Patient reports no complaints.  HISTORY: OB History  Gravida Para Term Preterm AB Living  4 3 3  0 0 3  SAB IAB Ectopic Multiple Live Births  0 0 0 0 3    # Outcome Date GA Lbr Len/2nd Weight Sex Delivery Anes PTL Lv  4 Current           3 Term 09/27/20 [redacted]w[redacted]d 05:23 / 00:21 8 lb 15.7 oz (4.074 kg) M Vag-Spont None  LIV     Birth Comments: WNL     Name: [name],BOY [name]     Apgar1: 8  Apgar5: 9  2 Term 12/05/12 [redacted]w[redacted]d 04:35 / 00:06 8 lb 2 oz (3.685 kg) M Vag-Spont None  LIV     Birth Comments: none     Name: [name],BOY [name]     Apgar1: 9  Apgar5: 9  1 Term 06/05/06 [redacted]w[redacted]d  7 lb 1 oz (3.204 kg) M Vag-Spont None  LIV   Past Medical History:  Diagnosis Date   Depression    GERD (gastroesophageal reflux disease)    Hx of percutaneous transcatheter closure of congenital VSD    Migraine headache with aura    Panic attack    Past Surgical History:  Procedure Laterality Date   CARDIAC SURGERY     as a baby   TONSILLECTOMY     Family History  Problem Relation Age of Onset   Congestive Heart Failure Other    Asthma Paternal Aunt    Congestive Heart Failure Paternal Uncle    Cancer Paternal Grandmother        breast   Stroke Paternal Grandfather    Asthma Cousin    Healthy Mother    Hypertension Father    Diabetes Father    Social History   Tobacco Use   Smoking status: Former    Types: Cigarettes    Quit date: 08/16/2010    Years since quitting: 10.7   Smokeless tobacco: Never  Vaping Use   Vaping Use: Never used  Substance Use Topics   Alcohol use: Not Currently    Comment: last drink drink early September   Drug use: No   No Known Allergies Current Outpatient Medications on File Prior to Visit   Medication Sig Dispense Refill   Prenatal Vit-Fe Fumarate-FA (MULTIVITAMIN-PRENATAL) 27-0.8 MG TABS tablet Take 1 tablet by mouth daily at 12 noon.     No current facility-administered medications on file prior to visit.     Exam   Vitals:   05/21/21 1027  BP: 125/84  Pulse: 97  Weight: 195 lb 12.8 oz (88.8 kg)   Fetal Heart Rate (bpm): 159  Uterus:     Pelvic Exam: Perineum: no hemorrhoids, normal perineum   Vulva: normal external genitalia, no lesions   Vagina:  normal mucosa, normal discharge   Cervix: no lesions and normal, pap smear done.    Adnexa: normal adnexa and no mass, fullness, tenderness   Bony Pelvis: average  System: General: well-developed, well-nourished female in no acute distress   Breast:  normal appearance, no masses or tenderness   Skin: normal coloration and turgor, no rashes   Neurologic: oriented, normal, negative, normal mood   Extremities: normal strength, tone, and muscle  mass, ROM of all joints is normal   HEENT PERRLA, extraocular movement intact and sclera clear, anicteric   Mouth/Teeth mucous membranes moist, pharynx normal without lesions and dental hygiene good   Neck supple and no masses   Cardiovascular: regular rate and rhythm   Respiratory:  no respiratory distress, normal breath sounds   Abdomen: soft, non-tender; bowel sounds normal; no masses,  no organomegaly     Assessment:   Pregnancy: [id number] Patient Active Problem List   Diagnosis Date Noted   Depression 05/21/2021   S/P VSD repair    Supervision of other normal pregnancy, antepartum 04/19/2021   Migraine headache with aura    GERD (gastroesophageal reflux disease) 12/01/2006     Plan:  1. Supervision of other normal pregnancy, antepartum New OB labs today - CBC/D/Plt+RPR+Rh+ABO+RubIgG... - Culture, OB Urine - Genetic Screening - TSH - Hemoglobin A1c - [place] MFM OB DETAIL +14 WK; Future  2. Migraine with aura and without status migrainosus, not  intractable Having worse headaches for now  3. S/P VSD repair No issues since childhood--will obtain fetal echo - Ambulatory referral to Pediatric Cardiology  4. Major depressive disorder with single episode, in full remission [facility]) Not on meds--was more situational  5. Screening for cervical cancer - Cytology - PAP  6. 11 weeks pregnancy   Initial labs drawn. Continue prenatal vitamins. Genetic Screening discussed, NIPS: ordered. Ultrasound discussed; fetal anatomic survey: ordered. Problem list reviewed and updated. The nature of [facility] with multiple MDs and other Advanced Practice Providers was explained to patient; also emphasized that residents, students are part of our team. Routine obstetric precautions reviewed. Return in 4 weeks (on 06/18/2021).

## 2021-05-23 LAB — CULTURE, OB URINE

## 2021-05-23 LAB — URINE CULTURE, OB REFLEX

## 2021-05-24 LAB — CYTOLOGY - PAP
Chlamydia: NEGATIVE
Comment: NEGATIVE
Comment: NEGATIVE
Comment: NORMAL
Diagnosis: NEGATIVE
High risk HPV: NEGATIVE
Neisseria Gonorrhea: NEGATIVE

## 2021-05-30 ENCOUNTER — Telehealth: Payer: Self-pay | Admitting: *Deleted

## 2021-05-30 ENCOUNTER — Encounter: Payer: Self-pay | Admitting: *Deleted

## 2021-05-30 NOTE — Telephone Encounter (Signed)
Pt informed of panorama results  ?

## 2021-06-18 ENCOUNTER — Ambulatory Visit (INDEPENDENT_AMBULATORY_CARE_PROVIDER_SITE_OTHER): Payer: Medicaid Other | Admitting: Obstetrics & Gynecology

## 2021-06-18 ENCOUNTER — Encounter: Payer: Self-pay | Admitting: Obstetrics & Gynecology

## 2021-06-18 ENCOUNTER — Other Ambulatory Visit: Payer: Self-pay

## 2021-06-18 VITALS — BP 124/78 | HR 102 | Wt 195.8 lb

## 2021-06-18 DIAGNOSIS — Z3A15 15 weeks gestation of pregnancy: Secondary | ICD-10-CM

## 2021-06-18 DIAGNOSIS — Z8774 Personal history of (corrected) congenital malformations of heart and circulatory system: Secondary | ICD-10-CM

## 2021-06-18 DIAGNOSIS — Z3480 Encounter for supervision of other normal pregnancy, unspecified trimester: Secondary | ICD-10-CM

## 2021-06-18 DIAGNOSIS — Z3482 Encounter for supervision of other normal pregnancy, second trimester: Secondary | ICD-10-CM

## 2021-06-18 NOTE — Progress Notes (Signed)
? ?  PRENATAL VISIT NOTE ? ?Subjective:  ?[name] is a 36 y.o. [phone] at [redacted]w[redacted]d being seen today for ongoing prenatal care.  She is currently monitored for the following issues for this low-risk pregnancy and has GERD (gastroesophageal reflux disease); Migraine headache with aura; Supervision of other normal pregnancy, antepartum; S/P VSD repair; and Depression on their problem list. ? ?Patient reports no complaints.  Contractions: Not present. Vag. Bleeding: None.  Movement: Absent. Denies leaking of fluid.  ? ?The following portions of the patient's history were reviewed and updated as appropriate: allergies, current medications, past family history, past medical history, past social history, past surgical history and problem list.  ? ?Objective:  ? ?Vitals:  ? 06/18/21 0827  ?BP: 124/78  ?Pulse: (!) 102  ?Weight: 195 lb 12.8 oz (88.8 kg)  ? ? ?Fetal Status: Fetal Heart Rate (bpm): 146   Movement: Absent    ? ?General:  Alert, oriented and cooperative. Patient is in no acute distress.  ?Skin: Skin is warm and dry. No rash noted.   ?Cardiovascular: Normal heart rate noted  ?Respiratory: Normal respiratory effort, no problems with respiration noted  ?Abdomen: Soft, gravid, appropriate for gestational age.  Pain/Pressure: Absent     ?Pelvic: Cervical exam deferred        ?Extremities: Normal range of motion.  Edema: None  ?Mental Status: Normal mood and affect. Normal behavior. Normal judgment and thought content.  ? ?Assessment and Plan:  ?Pregnancy: [id number] at [redacted]w[redacted]d ?1. S/P VSD repair ?Fetal ECHO already ordered. ? ?2. [redacted] weeks gestation of pregnancy ?3. Supervision of other normal pregnancy, antepartum ?Low risk NIPS, declined AFP.  Detailed anatomy scan already ordered. ?No other complaints or concerns.  Routine obstetric precautions reviewed. ? ?Please refer to After Visit Summary for other counseling recommendations.  ? ?Return in about 4 weeks (around 07/16/2021) for OFFICE OB VISIT (MD or  APP). ? ?Future Appointments  ?Date Time Provider Department Center  ?07/11/2021  1:30 PM [facility] NURSE [facility]  ?07/11/2021  1:45 PM [facility] US5 [facility]  ?07/16/2021  9:55 AM Loma [place], Jethro Bastos, MD [facility]  ?08/20/2021 10:35 AM Januel Doolan, Jethro Bastos, MD [facility]  ? ? ?Jaynie Collins, MD ? ?

## 2021-07-11 ENCOUNTER — Ambulatory Visit: Payer: Medicaid Other | Attending: Family Medicine

## 2021-07-11 ENCOUNTER — Ambulatory Visit: Payer: Medicaid Other | Admitting: *Deleted

## 2021-07-11 VITALS — BP 124/71 | HR 96

## 2021-07-11 DIAGNOSIS — O321XX0 Maternal care for breech presentation, not applicable or unspecified: Secondary | ICD-10-CM | POA: Diagnosis not present

## 2021-07-11 DIAGNOSIS — Z3A18 18 weeks gestation of pregnancy: Secondary | ICD-10-CM | POA: Insufficient documentation

## 2021-07-11 DIAGNOSIS — O99212 Obesity complicating pregnancy, second trimester: Secondary | ICD-10-CM | POA: Diagnosis not present

## 2021-07-11 DIAGNOSIS — Z3480 Encounter for supervision of other normal pregnancy, unspecified trimester: Secondary | ICD-10-CM | POA: Insufficient documentation

## 2021-07-11 DIAGNOSIS — Z87798 Personal history of other (corrected) congenital malformations: Secondary | ICD-10-CM | POA: Insufficient documentation

## 2021-07-11 DIAGNOSIS — Z363 Encounter for antenatal screening for malformations: Secondary | ICD-10-CM | POA: Insufficient documentation

## 2021-07-15 ENCOUNTER — Other Ambulatory Visit: Payer: Self-pay | Admitting: *Deleted

## 2021-07-15 DIAGNOSIS — Z8774 Personal history of (corrected) congenital malformations of heart and circulatory system: Secondary | ICD-10-CM

## 2021-07-15 DIAGNOSIS — Z6835 Body mass index (BMI) 35.0-35.9, adult: Secondary | ICD-10-CM

## 2021-07-16 ENCOUNTER — Ambulatory Visit (INDEPENDENT_AMBULATORY_CARE_PROVIDER_SITE_OTHER): Payer: Medicaid Other | Admitting: Obstetrics & Gynecology

## 2021-07-16 ENCOUNTER — Encounter: Payer: Self-pay | Admitting: Obstetrics & Gynecology

## 2021-07-16 VITALS — BP 119/78 | HR 111 | Wt 201.0 lb

## 2021-07-16 DIAGNOSIS — Z8774 Personal history of (corrected) congenital malformations of heart and circulatory system: Secondary | ICD-10-CM

## 2021-07-16 DIAGNOSIS — Z3A19 19 weeks gestation of pregnancy: Secondary | ICD-10-CM

## 2021-07-16 DIAGNOSIS — Z3480 Encounter for supervision of other normal pregnancy, unspecified trimester: Secondary | ICD-10-CM

## 2021-07-16 NOTE — Progress Notes (Signed)
? ?  PRENATAL VISIT NOTE ? ?Subjective:  ?[name] is a 36 y.o. [phone] at [redacted]w[redacted]d being seen today for ongoing prenatal care.  She is currently monitored for the following issues for this low-risk pregnancy and has GERD (gastroesophageal reflux disease); Migraine headache with aura; Supervision of other normal pregnancy, antepartum; S/P VSD repair; and Depression on their problem list. ? ?Patient reports no complaints.  Contractions: Not present. Vag. Bleeding: None.  Movement: Present. Denies leaking of fluid.  ? ?The following portions of the patient's history were reviewed and updated as appropriate: allergies, current medications, past family history, past medical history, past social history, past surgical history and problem list.  ? ?Objective:  ? ?Vitals:  ? 07/16/21 0953  ?BP: 119/78  ?Pulse: (!) 111  ?Weight: 201 lb (91.2 kg)  ? ? ?Fetal Status: Fetal Heart Rate (bpm): 156   Movement: Present    ? ?General:  Alert, oriented and cooperative. Patient is in no acute distress.  ?Skin: Skin is warm and dry. No rash noted.   ?Cardiovascular: Normal heart rate noted  ?Respiratory: Normal respiratory effort, no problems with respiration noted  ?Abdomen: Soft, gravid, appropriate for gestational age.  Pain/Pressure: Absent     ?Pelvic: Cervical exam deferred        ?Extremities: Normal range of motion.  Edema: None  ?Mental Status: Normal mood and affect. Normal behavior. Normal judgment and thought content.  ? ?Assessment and Plan:  ?Pregnancy: [id number] at [redacted]w[redacted]d ?1. S/P VSD repair ?Needs fetal ECHO, it will be scheduled soon ? ?2. [redacted] weeks gestation of pregnancy ?3. Supervision of other normal pregnancy, antepartum ?Follow up scan ordered.  No other complaints or concerns.  Routine obstetric precautions reviewed. ?Please refer to After Visit Summary for other counseling recommendations.  ? ?Return in about 4 weeks (around 08/13/2021) for OFFICE OB VISIT (MD or APP). ? ?Future Appointments  ?Date Time Provider  [place]  ?08/15/2021  1:15 PM [facility] NURSE [facility]  ?08/15/2021  1:30 PM [facility] US2 [facility]  ?08/20/2021 10:35 AM Atreus Hasz, Sallyanne Havers, MD [facility]  ? ? ?Verita Schneiders, MD ? ?

## 2021-08-05 ENCOUNTER — Telehealth: Payer: Self-pay

## 2021-08-05 NOTE — Telephone Encounter (Signed)
Return call to pt regarding feeling faint over the weekend. ?Pt works in [place].  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 ?Notes feeling better today. ?Pt advised to increase water intake, protein, ?6-8 small meals ?Monitor if dizziness or faint sx's happen again contact office/ report to [facility].  ?Pt agreeable and voiced understanding.  ? ?

## 2021-08-15 ENCOUNTER — Other Ambulatory Visit: Payer: Self-pay | Admitting: *Deleted

## 2021-08-15 ENCOUNTER — Ambulatory Visit: Payer: Medicaid Other | Admitting: *Deleted

## 2021-08-15 ENCOUNTER — Ambulatory Visit: Payer: Medicaid Other | Attending: Obstetrics and Gynecology

## 2021-08-15 VITALS — BP 118/70 | HR 93

## 2021-08-15 DIAGNOSIS — Z8774 Personal history of (corrected) congenital malformations of heart and circulatory system: Secondary | ICD-10-CM

## 2021-08-15 DIAGNOSIS — Z3A23 23 weeks gestation of pregnancy: Secondary | ICD-10-CM | POA: Insufficient documentation

## 2021-08-15 DIAGNOSIS — O99212 Obesity complicating pregnancy, second trimester: Secondary | ICD-10-CM | POA: Diagnosis not present

## 2021-08-15 DIAGNOSIS — Z362 Encounter for other antenatal screening follow-up: Secondary | ICD-10-CM | POA: Insufficient documentation

## 2021-08-15 DIAGNOSIS — Z87798 Personal history of other (corrected) congenital malformations: Secondary | ICD-10-CM

## 2021-08-15 DIAGNOSIS — Z3480 Encounter for supervision of other normal pregnancy, unspecified trimester: Secondary | ICD-10-CM

## 2021-08-15 DIAGNOSIS — Z6835 Body mass index (BMI) 35.0-35.9, adult: Secondary | ICD-10-CM

## 2021-08-20 ENCOUNTER — Ambulatory Visit (INDEPENDENT_AMBULATORY_CARE_PROVIDER_SITE_OTHER): Payer: Medicaid Other | Admitting: Obstetrics & Gynecology

## 2021-08-20 ENCOUNTER — Encounter: Payer: Self-pay | Admitting: Obstetrics & Gynecology

## 2021-08-20 VITALS — BP 116/73 | HR 98 | Wt 205.0 lb

## 2021-08-20 DIAGNOSIS — Z3480 Encounter for supervision of other normal pregnancy, unspecified trimester: Secondary | ICD-10-CM

## 2021-08-20 DIAGNOSIS — O99210 Obesity complicating pregnancy, unspecified trimester: Secondary | ICD-10-CM | POA: Insufficient documentation

## 2021-08-20 DIAGNOSIS — Z3A24 24 weeks gestation of pregnancy: Secondary | ICD-10-CM

## 2021-08-20 DIAGNOSIS — O99212 Obesity complicating pregnancy, second trimester: Secondary | ICD-10-CM

## 2021-08-20 DIAGNOSIS — Z8774 Personal history of (corrected) congenital malformations of heart and circulatory system: Secondary | ICD-10-CM

## 2021-08-20 NOTE — Progress Notes (Signed)
? ?PRENATAL VISIT NOTE ? ?Subjective:  ?[name] is a 36 y.o. [phone] at [redacted]w[redacted]d being seen today for ongoing prenatal care.  She is currently monitored for the following issues for this low-risk pregnancy and has GERD (gastroesophageal reflux disease); Migraine headache with aura; Supervision of other normal pregnancy, antepartum; S/P VSD repair; and Depression on their problem list. ? ?Patient reports no complaints.  Contractions: Not present.  .  Movement: Present. Denies leaking of fluid.  ? ?The following portions of the patient's history were reviewed and updated as appropriate: allergies, current medications, past family history, past medical history, past social history, past surgical history and problem list.  ? ?Objective:  ? ?Vitals:  ? 08/20/21 1053  ?BP: 116/73  ?Pulse: 98  ?Weight: 205 lb (93 kg)  ? ? ?Fetal Status: Fetal Heart Rate (bpm): 155   Movement: Present    ? ?General:  Alert, oriented and cooperative. Patient is in no acute distress.  ?Skin: Skin is warm and dry. No rash noted.   ?Cardiovascular: Normal heart rate noted  ?Respiratory: Normal respiratory effort, no problems with respiration noted  ?Abdomen: Soft, gravid, appropriate for gestational age.  Pain/Pressure: Absent     ?Pelvic: Cervical exam deferred        ?Extremities: Normal range of motion.     ?Mental Status: Normal mood and affect. Normal behavior. Normal judgment and thought content.  ? ?Imaging: ?[place] MFM OB FOLLOW UP ? ?Result Date: 08/15/2021 ?----------------------------------------------------------------------  OBSTETRICS REPORT                       (Signed Final 08/15/2021 02:56 pm) ---------------------------------------------------------------------- Patient Info  ID #:       [id number]                          D.O.B.:  [birth year] (35 yrs)  Name:       [name]                 Visit Date: 08/15/2021 01:34 pm ---------------------------------------------------------------------- Performed By  Attending:         Johnell Comings MD         Ref. Address:     [place]  Performed By:     Jacob Moores BS,       [facility], [facility]                                [facility] at                                                             [facility]  Referred By:      [place] ---------------------------------------------------------------------- Orders  #  Description                           Code        Ordered By  1  [place] MFM OB FOLLOW UP                   [phone]    [place] ----------------------------------------------------------------------  #  Order #                     Accession #                Episode #  1  [id number]                   [id number]                 [id number] ---------------------------------------------------------------------- Indications  Obesity complicating pregnancy, second         O99.212  trimester (BMI 17)  Personal history of congenital abnormality     Z87.798  (s/p VSD repair)  [redacted] weeks gestation of pregnancy                Z3A.23  Antenatal follow-up for nonvisualized fetal    Z36.2  anatomy ---------------------------------------------------------------------- Fetal Evaluation  Num Of Fetuses:         1  Fetal Heart Rate(bpm):  129  Cardiac Activity:       Observed  Presentation:           Variable  Placenta:               Posterior  P. Cord Insertion:      Previously Visualized  Amniotic Fluid  AFI FV:      Within normal limits                              Largest Pocket(cm)                              5.9 ---------------------------------------------------------------------- Biometry  BPD:      59.7  mm     G. Age:  24w 3d         63  %    CI:        68.88   %    70 - 86                                                          FL/HC:      17.9   %    18.7 - 20.9  HC:      229.8  mm     G. Age:  25w  0d         76  %    HC/AC:      1.11        1.05 - 1.21  AC:      207.7  mm     G. Age:  25w 2d         84  %    FL/BPD:  68.8   %    71 - 87  FL:       41.1  mm     G. Age:  23w 2d         22  %    FL/AC:      19.8   %    20 - 24  CER:      25.8  mm     G. Age:  23w 3d         12  %  LV:        6.7  mm  CM:        5.7  mm  Est. FW:     706  gm      1 lb 9 oz     73  % ---------------------------------------------------------------------- OB History  Blood Type:   A+  Gravidity:    4         Term:   3        Prem:   0        SAB:   0  TOP:          0       Ectopic:  0        Living: 3 ---------------------------------------------------------------------- Gestational Age  LMP:           25w 1d        Date:  02/20/21                 EDD:   11/27/21  U/S Today:     24w 4d                                        EDD:   12/01/21  Best:          23w 6d     Det. By:  U/S C R L  (04/19/21)    EDD:   12/06/21 ---------------------------------------------------------------------- Anatomy  Cranium:               Appears normal         Aortic Arch:            Appears normal  Cavum:                 Appears normal         Ductal Arch:            Appears normal  Ventricles:            Appears normal         Diaphragm:              Appears normal  Choroid Plexus:        Previously seen        Stomach:                Appears normal, left                                                                        sided  Cerebellum:            Appears  normal         Abdomen:                Appears normal  Posterior Fossa:       Previously seen        Abdominal Wall:         Appears nml (cord                                                                        insert, abd wall)  Nuchal Fold:           Previously seen        Cord Vessels:           Appears normal (3                                                                        vessel cord)  Face:                  Orbits and profile     Kidneys:                Appear normal                          previously seen  Lips:                  Previously seen        Bladder:                Appears normal  Thoracic:              Appears normal         Spine:                  Appears normal  Heart:                 Appears normal         Upper Extremities:      Previously seen                         (4CH, axis, and                         situs)  RVOT:                  Appears normal         Lower Extremities:      Previously seen  LVOT:                  Appears normal  Other:  Female gender previously seen. VC, 3VV, 3VTV, Heels/feet, and hands          visualized previously. Technically difficult due to maternal habitus and          fetal position. ---------------------------------------------------------------------- Cervix Uterus Adnexa  Cervix  Length:           4.86  cm.  Normal appearance by transabdominal scan.  Uterus  Normal shape and size.  Right Ovary  Within normal limits.  Left Ovary  Within normal limits.  Cul De Sac  No free fluid seen.  Adnexa  No adnexal mass visualized. ---------------------------------------------------------------------- Comments  This patient was seen for a follow up growth scan due to  maternal obesity.  The patient herself had a VSD that was  repaired when she was an infant.  She had a fetal  echocardiogram performed by [facility] pediatric cardiology  today that was within normal limits.  She denies any problems  since her last exam.  She was informed that the fetal growth and amniotic fluid  level appears appropriate for her gestational age.  Due to maternal obesity and her history of a VSD, a follow-up  exam was scheduled in 5 weeks. ----------------------------------------------------------------------                   Johnell Comings, MD Electronically Signed Final Report   08/15/2021 02:56 pm ----------------------------------------------------------------------  ? ?Assessment and Plan:  ?Pregnancy: [id number] at [redacted]w[redacted]d ?1. Maternal history of VSD s/p repair ?Normal  fetal ECHO. ? ?2. Obesity in pregnancy ?3. [redacted] weeks gestation of pregnancy ?4. Supervision of other normal pregnancy, antepartum ?Growth scans as per MFM. TWG 11 lb, doing well. ?Preterm labor symptoms a

## 2021-08-20 NOTE — Patient Instructions (Signed)
Return to office for any scheduled appointments. Call the office or go to the [facility] at [facility] at  if: You begin to have strong, frequent contractions Your water breaks.  Sometimes it is a big gush of fluid, sometimes it is just a trickle that keeps getting your underwear wet or running down your legs You have vaginal bleeding.  It is normal to have a small amount of spotting if your cervix was checked.  You do not feel your baby moving like normal.  If you do not, get something to eat and drink and lay down and focus on feeling your baby move.   If your baby is still not moving like normal, you should call the office or go to MAU. Any other obstetric concerns.  

## 2021-09-17 ENCOUNTER — Ambulatory Visit (INDEPENDENT_AMBULATORY_CARE_PROVIDER_SITE_OTHER): Payer: Medicaid Other | Admitting: Obstetrics and Gynecology

## 2021-09-17 VITALS — BP 115/78 | HR 111 | Wt 209.4 lb

## 2021-09-17 DIAGNOSIS — O99210 Obesity complicating pregnancy, unspecified trimester: Secondary | ICD-10-CM

## 2021-09-17 DIAGNOSIS — Z3A28 28 weeks gestation of pregnancy: Secondary | ICD-10-CM | POA: Diagnosis not present

## 2021-09-17 DIAGNOSIS — Z8774 Personal history of (corrected) congenital malformations of heart and circulatory system: Secondary | ICD-10-CM

## 2021-09-17 DIAGNOSIS — Z3480 Encounter for supervision of other normal pregnancy, unspecified trimester: Secondary | ICD-10-CM

## 2021-09-17 DIAGNOSIS — Z6838 Body mass index (BMI) 38.0-38.9, adult: Secondary | ICD-10-CM

## 2021-09-17 NOTE — Progress Notes (Unsigned)
K tape ***    PRENATAL VISIT NOTE  Subjective:  [name] is a 36 y.o. [phone] at [redacted]w[redacted]d being seen today for ongoing prenatal care.  She is currently monitored for the following issues for this high-risk pregnancy and has GERD (gastroesophageal reflux disease); Migraine headache with aura; Supervision of other normal pregnancy, antepartum; S/P VSD repair; Depression; and Obesity in pregnancy, antepartum on their problem list.  Patient reports  low belly discomfort, wondering about using k tape .  Contractions: Not present. Vag. Bleeding: None.  Movement: Present. Denies leaking of fluid.   The following portions of the patient's history were reviewed and updated as appropriate: allergies, current medications, past family history, past medical history, past social history, past surgical history and problem list.   Objective:   Vitals:   09/17/21 0840  BP: 115/78  Pulse: (!) 111  Weight: 209 lb 6.4 oz (95 kg)    Fetal Status: Fetal Heart Rate (bpm): 136   Movement: Present     General:  Alert, oriented and cooperative. Patient is in no acute distress.  Skin: Skin is warm and dry. No rash noted.   Cardiovascular: Normal heart rate noted  Respiratory: Normal respiratory effort, no problems with respiration noted  Abdomen: Soft, gravid, appropriate for gestational age.  Pain/Pressure: Present     Pelvic: Cervical exam deferred        Extremities: Normal range of motion.  Edema: None  Mental Status: Normal mood and affect. Normal behavior. Normal judgment and thought content.   Assessment and Plan:  Pregnancy: [id number] at [redacted]w[redacted]d 1. Supervision of other normal pregnancy, antepartum 28wk labs today. Btl papers signed. Pt still strongly considering. Btl d/w her as well as other options.  - CBC - Glucose Tolerance, 2 Hours w/1 Hour - HIV Antibody (routine testing w rflx) - RPR  2. [redacted] weeks gestation of pregnancy  3. S/P VSD repair No maternal s/s. S/p normal fetal echo this  pregnancy. Getting surveillance growth u/s with mfm on 6/15 5/11: 73%, 706gm, ac 84%, afi wnl  4. Obesity in pregnancy, antepartum stable  5. BMI 38.0-38.9,adult  Preterm labor symptoms and general obstetric precautions including but not limited to vaginal bleeding, contractions, leaking of fluid and fetal movement were reviewed in detail with the patient. Please refer to After Visit Summary for other counseling recommendations.   No follow-ups on file.  Future Appointments  Date Time Provider [place]  09/19/2021  2:45 PM [facility] NURSE [facility]  09/19/2021  3:00 PM [facility] US1 [facility]  10/01/2021  1:30 PM Anyanwu, Sallyanne Havers, MD [facility]  10/15/2021 10:35 AM Anyanwu, Sallyanne Havers, MD [facility]  10/29/2021 10:35 AM Anyanwu, Sallyanne Havers, MD [facility]    Aletha Halim, MD

## 2021-09-18 LAB — CBC
Hematocrit: 35 % (ref 34.0–46.6)
Hemoglobin: 11.5 g/dL (ref 11.1–15.9)
MCH: 28 pg (ref 26.6–33.0)
MCHC: 32.9 g/dL (ref 31.5–35.7)
MCV: 85 fL (ref 79–97)
Platelets: 225 10*3/uL (ref 150–450)
RBC: 4.11 x10E6/uL (ref 3.77–5.28)
RDW: 13.7 % (ref 11.7–15.4)
WBC: 7.7 10*3/uL (ref 3.4–10.8)

## 2021-09-18 LAB — GLUCOSE TOLERANCE, 2 HOURS W/ 1HR
Glucose, 1 hour: 163 mg/dL (ref 70–179)
Glucose, 2 hour: 142 mg/dL (ref 70–152)
Glucose, Fasting: 88 mg/dL (ref 70–91)

## 2021-09-18 LAB — HIV ANTIBODY (ROUTINE TESTING W REFLEX): HIV Screen 4th Generation wRfx: NONREACTIVE

## 2021-09-18 LAB — RPR: RPR Ser Ql: NONREACTIVE

## 2021-09-19 ENCOUNTER — Other Ambulatory Visit: Payer: Self-pay | Admitting: *Deleted

## 2021-09-19 ENCOUNTER — Ambulatory Visit: Payer: Medicaid Other | Admitting: *Deleted

## 2021-09-19 ENCOUNTER — Ambulatory Visit: Payer: Medicaid Other | Attending: Obstetrics and Gynecology

## 2021-09-19 VITALS — BP 124/67 | HR 102

## 2021-09-19 DIAGNOSIS — O99212 Obesity complicating pregnancy, second trimester: Secondary | ICD-10-CM

## 2021-09-19 DIAGNOSIS — Z3A28 28 weeks gestation of pregnancy: Secondary | ICD-10-CM | POA: Diagnosis not present

## 2021-09-19 DIAGNOSIS — Z6835 Body mass index (BMI) 35.0-35.9, adult: Secondary | ICD-10-CM

## 2021-09-19 DIAGNOSIS — Q249 Congenital malformation of heart, unspecified: Secondary | ICD-10-CM

## 2021-09-19 DIAGNOSIS — E669 Obesity, unspecified: Secondary | ICD-10-CM | POA: Diagnosis not present

## 2021-09-19 DIAGNOSIS — O99891 Other specified diseases and conditions complicating pregnancy: Secondary | ICD-10-CM

## 2021-09-19 DIAGNOSIS — O99213 Obesity complicating pregnancy, third trimester: Secondary | ICD-10-CM

## 2021-09-19 DIAGNOSIS — Z8774 Personal history of (corrected) congenital malformations of heart and circulatory system: Secondary | ICD-10-CM | POA: Insufficient documentation

## 2021-09-19 DIAGNOSIS — Q210 Ventricular septal defect: Secondary | ICD-10-CM

## 2021-09-26 ENCOUNTER — Encounter: Payer: Self-pay | Admitting: Obstetrics and Gynecology

## 2021-10-01 ENCOUNTER — Ambulatory Visit (INDEPENDENT_AMBULATORY_CARE_PROVIDER_SITE_OTHER): Payer: Medicaid Other | Admitting: Obstetrics & Gynecology

## 2021-10-01 ENCOUNTER — Encounter: Payer: Self-pay | Admitting: Obstetrics & Gynecology

## 2021-10-01 VITALS — BP 127/85 | HR 111 | Wt 208.0 lb

## 2021-10-01 DIAGNOSIS — Z23 Encounter for immunization: Secondary | ICD-10-CM

## 2021-10-01 DIAGNOSIS — Z3480 Encounter for supervision of other normal pregnancy, unspecified trimester: Secondary | ICD-10-CM

## 2021-10-01 DIAGNOSIS — Z3A30 30 weeks gestation of pregnancy: Secondary | ICD-10-CM

## 2021-10-01 DIAGNOSIS — O99210 Obesity complicating pregnancy, unspecified trimester: Secondary | ICD-10-CM

## 2021-10-15 ENCOUNTER — Ambulatory Visit (INDEPENDENT_AMBULATORY_CARE_PROVIDER_SITE_OTHER): Payer: Medicaid Other | Admitting: Obstetrics & Gynecology

## 2021-10-15 VITALS — BP 120/81 | HR 108 | Wt 209.2 lb

## 2021-10-15 DIAGNOSIS — Z3480 Encounter for supervision of other normal pregnancy, unspecified trimester: Secondary | ICD-10-CM

## 2021-10-15 DIAGNOSIS — Z3A32 32 weeks gestation of pregnancy: Secondary | ICD-10-CM

## 2021-10-15 DIAGNOSIS — O99210 Obesity complicating pregnancy, unspecified trimester: Secondary | ICD-10-CM

## 2021-10-15 NOTE — Patient Instructions (Signed)
Return to office for any scheduled appointments. Call the office or go to the MAU at [facility] at [place] if: You begin to have strong, frequent contractions Your water breaks.  Sometimes it is a big gush of fluid, sometimes it is just a trickle that keeps getting your underwear wet or running down your legs You have vaginal bleeding.  It is normal to have a small amount of spotting if your cervix was checked.  You do not feel your baby moving like normal.  If you do not, get something to eat and drink and lay down and focus on feeling your baby move.   If your baby is still not moving like normal, you should call the office or go to MAU. Any other obstetric concerns.  

## 2021-10-15 NOTE — Progress Notes (Signed)
PRENATAL VISIT NOTE  Subjective:  [name] is a 36 y.o. [phone] at [redacted]w[redacted]d being seen today for ongoing prenatal care.  She is currently monitored for the following issues for this low-risk pregnancy and has GERD (gastroesophageal reflux disease); Migraine headache with aura; Supervision of other normal pregnancy, antepartum; S/P VSD repair; Depression; and Obesity in pregnancy, antepartum on their problem list.  Patient reports no complaints.  Contractions: Not present. Vag. Bleeding: None.  Movement: Present. Denies leaking of fluid.   The following portions of the patient's history were reviewed and updated as appropriate: allergies, current medications, past family history, past medical history, past social history, past surgical history and problem list.   Objective:   Vitals:   10/15/21 1035  BP: 120/81  Pulse: (!) 108  Weight: 209 lb 3.2 oz (94.9 kg)    Fetal Status: Fetal Heart Rate (bpm): 140   Movement: Present     General:  Alert, oriented and cooperative. Patient is in no acute distress.  Skin: Skin is warm and dry. No rash noted.   Cardiovascular: Normal heart rate noted  Respiratory: Normal respiratory effort, no problems with respiration noted  Abdomen: Soft, gravid, appropriate for gestational age.  Pain/Pressure: Present     Pelvic: Cervical exam deferred        Extremities: Normal range of motion.  Edema: None  Mental Status: Normal mood and affect. Normal behavior. Normal judgment and thought content.   [place] MFM OB FOLLOW UP  Result Date: 09/19/2021 ----------------------------------------------------------------------  OBSTETRICS REPORT                       (Signed Final 09/19/2021 04:11 pm) ---------------------------------------------------------------------- Patient Info  ID #:       [id number]                          D.O.B.:  1985/10/25 (35 yrs)  Name:       [name]                 Visit Date: 09/19/2021 03:20 pm  ---------------------------------------------------------------------- Performed By  Attending:        Lin Landsman      Ref. Address:     [address]                    [address]                                                             [address]  Performed By:     Charlyne Petrin     [facility]                                     [facility]  Referred By:      [facility] ---------------------------------------------------------------------- Orders  #  Description                           Code        Ordered By  1  [place] MFM OB FOLLOW UP                   [phone]    Rosana Hoes ----------------------------------------------------------------------  #  Order #                     Accession #                Episode #  1  [id number]                   [id number]                 [id number] ---------------------------------------------------------------------- Indications  Obesity complicating pregnancy, second         O99.212  trimester (BMI 35)  Personal history of congenital abnormality     Z87.798  (s/p VSD repair)  [redacted] weeks gestation of pregnancy                Z3A.28 ---------------------------------------------------------------------- Fetal Evaluation  Num Of Fetuses:         1  Fetal Heart Rate(bpm):  154  Cardiac Activity:       Observed  Presentation:           Cephalic  Placenta:               Posterior  P. Cord Insertion:      Previously Visualized  Amniotic Fluid  AFI FV:      Within normal limits  AFI Sum(cm)     %Tile       Largest Pocket(cm)  15.84           57          5.62  RUQ(cm)       RLQ(cm)       LUQ(cm)        LLQ(cm)  5.62          3.73          3.17           3.32 ---------------------------------------------------------------------- Biometry  BPD:     79.57  mm     G. Age:  32w 0d         99  %    CI:         73.54   %    70 - 86                                                          FL/HC:      18.4   %    19.6 - 20.8  HC:      294.8  mm     G. Age:  32w 4d         98  %    HC/AC:      1.12        0.99 - 1.21  AC:    263.94   mm     G. Age:  30w 4d         88  %    FL/BPD:     68.0   %    71 - 87  FL:      54.11  mm     G. Age:  28w 4d         29  %    FL/AC:      20.5   %    20 - 24  HUM:        48  mm     G. Age:  28w 1d         31  %  Est. FW:    1534  gm      3 lb 6 oz     85  % ---------------------------------------------------------------------- OB History  Blood Type:   A+  Gravidity:    4         Term:   3        Prem:   0        SAB:   0  TOP:          0       Ectopic:  0        Living: 3 ---------------------------------------------------------------------- Gestational Age  LMP:           30w 1d        Date:  02/20/21                 EDD:   11/27/21  U/S Today:     31w 0d                                        EDD:   11/21/21  Best:          28w 6d     Det. By:  U/S C R L  (04/19/21)    EDD:   12/06/21 ---------------------------------------------------------------------- Anatomy  Cranium:               Appears normal         Aortic Arch:            Previously seen  Cavum:                 Appears normal         Ductal Arch:            Previously seen  Ventricles:            Previously seen        Diaphragm:              Appears normal  Choroid Plexus:        Previously seen        Stomach:                Appears normal, left  sided  Cerebellum:            Previously seen        Abdomen:                Appears normal  Posterior Fossa:       Previously seen        Abdominal Wall:         Previously seen  Nuchal Fold:           Previously seen        Cord Vessels:           Appears normal (3                                                                        vessel cord)  Face:                  Appears normal         Kidneys:                 Appear normal                         (orbits and profile)  Lips:                  Previously seen        Bladder:                Appears normal  Thoracic:              Appears normal         Spine:                  Previously seen  Heart:                 Previously seen        Upper Extremities:      Previously seen  RVOT:                  Previously seen        Lower Extremities:      Previously seen  LVOT:                  Previously seen  Other:  Fetus appears to be a female.VC, 3VV, 3VTV, Heels/feet, and hands          visualized previously. Technically difficult due to maternal habitus and          fetal position. ---------------------------------------------------------------------- Cervix Uterus Adnexa  Cervix  Not visualized (advanced GA >24wks)  Uterus  Normal shape and size.  Right Ovary  Not visualized.  Left Ovary  Not visualized. ---------------------------------------------------------------------- Impression  Follow up growth due to elevated BMI  Normal interval growth with measurements consistent with  dates  Good fetal movement and amniotic fluid volume ---------------------------------------------------------------------- Recommendations  Repeat growth in 4 weeks. ----------------------------------------------------------------------              Lin Landsmanorenthian Booker, MD Electronically Signed Final Report   09/19/2021 04:11 pm ----------------------------------------------------------------------   Assessment and Plan:  Pregnancy: [id number] at 395w4d 1. Obesity in pregnancy, antepartum TWG 15 lbs, follow up upcoming scan.  2. [redacted] weeks gestation of pregnancy 3. Supervision of other normal pregnancy, antepartum Preterm labor symptoms and general obstetric precautions including but not limited to vaginal bleeding, contractions, leaking of fluid and fetal movement were reviewed in detail with the patient. Please refer to After Visit Summary for other counseling recommendations.   Return in  about 2 weeks (around 10/29/2021) for OFFICE OB VISIT (MD only).  Future Appointments  Date Time Provider Department Center  10/17/2021  3:15 PM [facility] NURSE [facility]  10/17/2021  3:30 PM [facility] US3 [facility]  10/29/2021 10:35 AM Donnica Jarnagin, Jethro Bastos, MD [facility]  11/12/2021 10:55 AM Dahiana Kulak, Jethro Bastos, MD [facility]  11/19/2021 10:55 AM Dayton Bing, MD [facility]  11/26/2021 10:55 AM  Bing, MD [facility]  12/03/2021 10:35 AM Lakara Weiland, Jethro Bastos, MD [facility]    Jaynie Collins, MD

## 2021-10-17 ENCOUNTER — Ambulatory Visit: Payer: Medicaid Other | Admitting: *Deleted

## 2021-10-17 ENCOUNTER — Ambulatory Visit: Payer: Medicaid Other | Attending: Maternal & Fetal Medicine

## 2021-10-17 VITALS — BP 119/74 | HR 111

## 2021-10-17 DIAGNOSIS — O09893 Supervision of other high risk pregnancies, third trimester: Secondary | ICD-10-CM

## 2021-10-17 DIAGNOSIS — O99213 Obesity complicating pregnancy, third trimester: Secondary | ICD-10-CM

## 2021-10-17 DIAGNOSIS — Z3480 Encounter for supervision of other normal pregnancy, unspecified trimester: Secondary | ICD-10-CM | POA: Insufficient documentation

## 2021-10-17 DIAGNOSIS — Z87798 Personal history of other (corrected) congenital malformations: Secondary | ICD-10-CM

## 2021-10-17 DIAGNOSIS — E669 Obesity, unspecified: Secondary | ICD-10-CM | POA: Diagnosis not present

## 2021-10-17 DIAGNOSIS — Z3A32 32 weeks gestation of pregnancy: Secondary | ICD-10-CM

## 2021-10-17 DIAGNOSIS — Q210 Ventricular septal defect: Secondary | ICD-10-CM | POA: Insufficient documentation

## 2021-10-29 ENCOUNTER — Encounter: Payer: Self-pay | Admitting: Obstetrics & Gynecology

## 2021-10-29 ENCOUNTER — Ambulatory Visit (INDEPENDENT_AMBULATORY_CARE_PROVIDER_SITE_OTHER): Payer: Medicaid Other | Admitting: Obstetrics & Gynecology

## 2021-10-29 VITALS — BP 125/85 | HR 101 | Wt 210.4 lb

## 2021-10-29 DIAGNOSIS — Z3480 Encounter for supervision of other normal pregnancy, unspecified trimester: Secondary | ICD-10-CM

## 2021-10-29 DIAGNOSIS — Z3A34 34 weeks gestation of pregnancy: Secondary | ICD-10-CM

## 2021-10-29 NOTE — Progress Notes (Signed)
PRENATAL VISIT NOTE  Subjective:  [name] is a 36 y.o. [phone] at 4164w4d being seen today for ongoing prenatal care.  She is currently monitored for the following issues for this low-risk pregnancy and has GERD (gastroesophageal reflux disease); Migraine headache with aura; Supervision of other normal pregnancy, antepartum; S/P VSD repair; Depression; and Obesity in pregnancy, antepartum on their problem list.  Patient reports no complaints.  Contractions: Irritability. Vag. Bleeding: None.  Movement: Present. Denies leaking of fluid.   The following portions of the patient's history were reviewed and updated as appropriate: allergies, current medications, past family history, past medical history, past social history, past surgical history and problem list.   Objective:   Vitals:   10/29/21 1047  BP: 125/85  Pulse: (!) 101  Weight: 210 lb 6.4 oz (95.4 kg)    Fetal Status: Fetal Heart Rate (bpm): 140 Fundal Height: 36 cm Movement: Present     General:  Alert, oriented and cooperative. Patient is in no acute distress.  Skin: Skin is warm and dry. No rash noted.   Cardiovascular: Normal heart rate noted  Respiratory: Normal respiratory effort, no problems with respiration noted  Abdomen: Soft, gravid, appropriate for gestational age.  Pain/Pressure: Present     Pelvic: Cervical exam deferred        Extremities: Normal range of motion.  Edema: None  Mental Status: Normal mood and affect. Normal behavior. Normal judgment and thought content.   Imaging: US MFM OB FOLLOW UP  Result Date: 10/17/2021 ----------------------------------------------------------------------  OBSTETRICS REPORT                       (Signed Final 10/17/2021 03:47 pm) ---------------------------------------------------------------------- Patient Info  ID #:       [id number]                          D.O.B.:  20-Apr-1985 (35 yrs)  Name:       [name]                 Visit Date: 10/17/2021 03:33 pm  ---------------------------------------------------------------------- Performed By  Attending:        Noralee Spaceavi Shankar MD        Ref. Address:     [address]                                                             [address]  Performed By:     Edmon CrapeLou Ann Armstrong      [facility]  Referred By:      [facility] ---------------------------------------------------------------------- Orders  #  Description                           Code        Ordered By  1  [place] MFM OB FOLLOW UP                   93818.29    Lin Landsman ----------------------------------------------------------------------  #  Order #                     Accession #                Episode #  1  [id number]                   [id number]                 [id number] ---------------------------------------------------------------------- Indications  [redacted] weeks gestation of pregnancy                Z3A.32  Personal history of congenital abnormality     Z87.798  (s/p VSD repair)  Obesity complicating pregnancy, third          O99.213  trimester ---------------------------------------------------------------------- Fetal Evaluation  Num Of Fetuses:         1  Fetal Heart Rate(bpm):  157  Cardiac Activity:       Observed  Presentation:           Cephalic  Placenta:               Posterior  P. Cord Insertion:      Previously Visualized  Amniotic Fluid  AFI FV:      Within normal limits  AFI Sum(cm)     %Tile       Largest Pocket(cm)  15.76           56          5.36  RUQ(cm)       RLQ(cm)       LUQ(cm)        LLQ(cm)  4.95          3.5           5.36           1.95 ---------------------------------------------------------------------- Biometry  BPD:     87.68  mm     G.  Age:  35w 3d         96  %    CI:        72.88   %    70 - 86  FL/HC:      18.7   %    19.9 - 21.5  HC:    326.54   mm     G. Age:  37w 0d         97  %    HC/AC:      1.10        0.96 - 1.11  AC:    296.98   mm     G. Age:  33w 5d         74  %    FL/BPD:     69.8   %    71 - 87  FL:      61.21  mm     G. Age:  31w 5d         14  %    FL/AC:      20.6   %    20 - 24  LV:        5.1  mm  Est. FW:    2239  gm    4 lb 15 oz      65  % ---------------------------------------------------------------------- OB History  Blood Type:   A+  Gravidity:    4         Term:   3        Prem:   0        SAB:   0  TOP:          0       Ectopic:  0        Living: 3 ---------------------------------------------------------------------- Gestational Age  LMP:           34w 1d        Date:  02/20/21                 EDD:   11/27/21  U/S Today:     34w 3d                                        EDD:   11/25/21  Best:          32w 6d     Det. By:  U/S C R L  (04/19/21)    EDD:   12/06/21 ---------------------------------------------------------------------- Anatomy  Cranium:               Appears normal         LVOT:                   Previously seen  Cavum:                 Previously seen        Aortic Arch:            Previously seen  Ventricles:            Appears normal         Ductal Arch:            Previously seen  Choroid Plexus:        Previously seen        Diaphragm:              Previously seen  Cerebellum:            Previously seen        Stomach:  Appears normal, left                                                                        sided  Posterior Fossa:       Previously seen        Abdomen:                Previously seen  Nuchal Fold:           Not applicable (>20    Abdominal Wall:         Previously seen                         wks GA)  Face:                  Orbits and profile     Cord Vessels:           Previously seen                          previously seen  Lips:                  Previously seen        Kidneys:                Appear normal  Palate:                Previously seen        Bladder:                Appears normal  Thoracic:              Previously seen        Spine:                  Previously seen  Heart:                 Previously seen        Upper Extremities:      Previously seen  RVOT:                  Previously seen        Lower Extremities:      Previously seen  Other:  Technically difficult due to advanced gestational age. ---------------------------------------------------------------------- Impression  Fetal growth is appropriate for gestational age .Amniotic fluid  is normal and good fetal activity is seen . ---------------------------------------------------------------------- Recommendations  Follow-up scans as clinically indicated. ----------------------------------------------------------------------                 Noralee Space, MD Electronically Signed Final Report   10/17/2021 03:47 pm ----------------------------------------------------------------------   Assessment and Plan:  Pregnancy: [id number] at [redacted]w[redacted]d 1. [redacted] weeks gestation of pregnancy 2. Supervision of other normal pregnancy, antepartum Normal growth and AFV.  Preterm labor symptoms and general obstetric precautions including but not limited to vaginal bleeding, contractions, leaking of fluid and fetal movement were reviewed in detail with the patient. Please refer to After Visit Summary for other counseling recommendations.   Return in about 2 weeks (around 11/12/2021) for Pelvic cultures, OFFICE OB VISIT (MD or APP).  Future Appointments  Date Time Provider Department Center  11/12/2021 10:55 AM Rashawn Rolon, Jethro Bastos, MD [facility]  11/19/2021 10:55 AM [place] Bing, MD [facility]  11/26/2021 10:55 AM [place] Bing, MD [facility]  12/03/2021 10:35 AM Raechel Marcos, Jethro Bastos, MD [facility]    Jaynie Collins, MD

## 2021-10-29 NOTE — Patient Instructions (Signed)
Return to office for any scheduled appointments. Call the office or go to the MAU at [facility] if: You begin to have strong, frequent contractions Your water breaks.  Sometimes it is a big gush of fluid, sometimes it is just a trickle that keeps getting your underwear wet or running down your legs You have vaginal bleeding.  It is normal to have a small amount of spotting if your cervix was checked.  You do not feel your baby moving like normal.  If you do not, get something to eat and drink and lay down and focus on feeling your baby move.   If your baby is still not moving like normal, you should call the office or go to MAU. Any other obstetric concerns.  

## 2021-11-12 ENCOUNTER — Encounter: Payer: Self-pay | Admitting: Obstetrics & Gynecology

## 2021-11-12 ENCOUNTER — Other Ambulatory Visit (HOSPITAL_COMMUNITY)
Admission: RE | Admit: 2021-11-12 | Discharge: 2021-11-12 | Disposition: A | Discharge status: Home / Self Care | Payer: Medicaid Other | Source: Ambulatory Visit | Attending: Obstetrics & Gynecology | Admitting: Obstetrics & Gynecology

## 2021-11-12 ENCOUNTER — Ambulatory Visit (INDEPENDENT_AMBULATORY_CARE_PROVIDER_SITE_OTHER): Payer: Medicaid Other | Admitting: Obstetrics & Gynecology

## 2021-11-12 VITALS — BP 120/83 | HR 99 | Wt 212.0 lb

## 2021-11-12 DIAGNOSIS — Z3483 Encounter for supervision of other normal pregnancy, third trimester: Secondary | ICD-10-CM

## 2021-11-12 DIAGNOSIS — Z3A36 36 weeks gestation of pregnancy: Secondary | ICD-10-CM | POA: Diagnosis present

## 2021-11-12 DIAGNOSIS — Z3480 Encounter for supervision of other normal pregnancy, unspecified trimester: Secondary | ICD-10-CM | POA: Diagnosis present

## 2021-11-12 NOTE — Patient Instructions (Signed)
Return to office for any scheduled appointments. Call the office or go to the MAU at [facility] [place] if: You begin to have strong, frequent contractions Your water breaks.  Sometimes it is a big gush of fluid, sometimes it is just a trickle that keeps getting your underwear wet or running down your legs You have vaginal bleeding.  It is normal to have a small amount of spotting if your cervix was checked.  You do not feel your baby moving like normal.  If you do not, get something to eat and drink and lay down and focus on feeling your baby move.   If your baby is still not moving like normal, you should call the office or go to MAU. Any other obstetric concerns.  

## 2021-11-12 NOTE — Progress Notes (Signed)
PRENATAL VISIT NOTE  Subjective:  [name] is a 36 y.o. [phone] at [redacted]w[redacted]d being seen today for ongoing prenatal care.  She is currently monitored for the following issues for this low-risk pregnancy and has GERD (gastroesophageal reflux disease); Migraine headache with aura; Supervision of other normal pregnancy, antepartum; S/P VSD repair; Depression; and Obesity in pregnancy, antepartum on their problem list.  Patient reports occasional contractions.  Contractions: Irritability. Vag. Bleeding: None.  Movement: Present. Denies leaking of fluid.   The following portions of the patient's history were reviewed and updated as appropriate: allergies, current medications, past family history, past medical history, past social history, past surgical history and problem list.   Objective:   Vitals:   11/12/21 1054  BP: 120/83  Pulse: 99  Weight: 212 lb (96.2 kg)    Fetal Status: Fetal Heart Rate (bpm): 138   Movement: Present  Presentation: Vertex  General:  Alert, oriented and cooperative. Patient is in no acute distress.  Skin: Skin is warm and dry. No rash noted.   Cardiovascular: Normal heart rate noted  Respiratory: Normal respiratory effort, no problems with respiration noted  Abdomen: Soft, gravid, appropriate for gestational age.  Pain/Pressure: Present     Pelvic: Cervical exam performed in the presence of a chaperone Dilation: 3 Effacement (%): Thick Station: -2. Cultures done.  Extremities: Normal range of motion.  Edema: None  Mental Status: Normal mood and affect. Normal behavior. Normal judgment and thought content.   [place] MFM OB FOLLOW UP  Result Date: 10/17/2021 ----------------------------------------------------------------------  OBSTETRICS REPORT                       (Signed Final 10/17/2021 03:47 pm) ---------------------------------------------------------------------- Patient Info  ID #:       [id number]                          D.O.B.:  [birth year] (35 yrs)  Name:        [name]                 Visit Date: 10/17/2021 03:33 pm ---------------------------------------------------------------------- Performed By  Attending:        Noralee Space MD        Ref. Address:     [address]                                                             [address]  Performed By:     Edmon Crape      [facility]  Referred By:      [facility] ---------------------------------------------------------------------- Orders  #  Description                           Code        Ordered By  1  [place] MFM OB FOLLOW UP                   28366.29    Lin Landsman ----------------------------------------------------------------------  #  Order #                     Accession #                Episode #  1  [id number]                   [id number]                 [id number] ---------------------------------------------------------------------- Indications  [redacted] weeks gestation of pregnancy                Z3A.32  Personal history of congenital abnormality     Z87.798  (s/p VSD repair)  Obesity complicating pregnancy, third          O99.213  trimester ---------------------------------------------------------------------- Fetal Evaluation  Num Of Fetuses:         1  Fetal Heart Rate(bpm):  157  Cardiac Activity:       Observed  Presentation:           Cephalic  Placenta:               Posterior  P. Cord Insertion:      Previously Visualized  Amniotic Fluid  AFI FV:      Within normal limits  AFI Sum(cm)     %Tile       Largest Pocket(cm)  15.76           56          5.36  RUQ(cm)       RLQ(cm)       LUQ(cm)        LLQ(cm)  4.95          3.5           5.36           1.95  ---------------------------------------------------------------------- Biometry  BPD:     87.68  mm     G. Age:  35w 3d         96  %    CI:        72.88   %    70 - 86  FL/HC:      18.7   %    19.9 - 21.5  HC:    326.54   mm     G. Age:  37w 0d         97  %    HC/AC:      1.10        0.96 - 1.11  AC:    296.98   mm     G. Age:  33w 5d         74  %    FL/BPD:     69.8   %    71 - 87  FL:      61.21  mm     G. Age:  31w 5d         14  %    FL/AC:      20.6   %    20 - 24  LV:        5.1  mm  Est. FW:    2239  gm    4 lb 15 oz      65  % ---------------------------------------------------------------------- OB History  Blood Type:   A+  Gravidity:    4         Term:   3        Prem:   0        SAB:   0  TOP:          0       Ectopic:  0        Living: 3 ---------------------------------------------------------------------- Gestational Age  LMP:           34w 1d        Date:  02/20/21                 EDD:   11/27/21  U/S Today:     34w 3d                                        EDD:   11/25/21  Best:          32w 6d     Det. By:  U/S C R L  (04/19/21)    EDD:   12/06/21 ---------------------------------------------------------------------- Anatomy  Cranium:               Appears normal         LVOT:                   Previously seen  Cavum:                 Previously seen        Aortic Arch:            Previously seen  Ventricles:            Appears normal         Ductal Arch:            Previously seen  Choroid Plexus:        Previously seen        Diaphragm:              Previously seen  Cerebellum:            Previously seen        Stomach:  Appears normal, left                                                                        sided  Posterior Fossa:       Previously seen        Abdomen:                Previously seen  Nuchal Fold:           Not applicable (>20    Abdominal Wall:         Previously seen                         wks GA)  Face:                   Orbits and profile     Cord Vessels:           Previously seen                         previously seen  Lips:                  Previously seen        Kidneys:                Appear normal  Palate:                Previously seen        Bladder:                Appears normal  Thoracic:              Previously seen        Spine:                  Previously seen  Heart:                 Previously seen        Upper Extremities:      Previously seen  RVOT:                  Previously seen        Lower Extremities:      Previously seen  Other:  Technically difficult due to advanced gestational age. ---------------------------------------------------------------------- Impression  Fetal growth is appropriate for gestational age .Amniotic fluid  is normal and good fetal activity is seen . ---------------------------------------------------------------------- Recommendations  Follow-up scans as clinically indicated. ----------------------------------------------------------------------                 Noralee Space, MD Electronically Signed Final Report   10/17/2021 03:47 pm ----------------------------------------------------------------------   Assessment and Plan:  Pregnancy: [id number] at [redacted]w[redacted]d 1. Supervision of other normal pregnancy, antepartum 2. [redacted] weeks gestation of pregnancy Pelvic cultures done today, will follow up results and manage accordingly. - Cervicovaginal ancillary only( [place]) - Strep Gp B NAA Preterm labor symptoms and general obstetric precautions including but not limited to vaginal bleeding, contractions, leaking of fluid and fetal movement were reviewed in detail with the patient. Please refer to After Visit Summary for other counseling recommendations.   Return in about  1 week (around 11/19/2021) for OFFICE OB VISIT (MD only).  Future Appointments  Date Time Provider Department Center  11/19/2021 10:55 AM [place] Bing, MD [facility]  11/26/2021 10:55 AM  [place] Bing, MD [facility]  12/03/2021 10:35 AM [place] Rossin, Jethro Bastos, MD [facility]    Jaynie Collins, MD

## 2021-11-13 LAB — CERVICOVAGINAL ANCILLARY ONLY
Chlamydia: NEGATIVE
Comment: NEGATIVE
Comment: NORMAL
Neisseria Gonorrhea: NEGATIVE

## 2021-11-14 LAB — STREP GP B NAA: Strep Gp B NAA: NEGATIVE

## 2021-11-17 ENCOUNTER — Inpatient Hospital Stay (HOSPITAL_COMMUNITY)
Admission: AD | Admit: 2021-11-17 | Discharge: 2021-11-18 | DRG: 807 | Disposition: A | Discharge status: Home / Self Care | Payer: Medicaid Other | Attending: Obstetrics and Gynecology | Admitting: Obstetrics and Gynecology

## 2021-11-17 ENCOUNTER — Other Ambulatory Visit: Payer: Self-pay

## 2021-11-17 ENCOUNTER — Encounter (HOSPITAL_COMMUNITY): Payer: Self-pay | Admitting: Obstetrics and Gynecology

## 2021-11-17 DIAGNOSIS — O99214 Obesity complicating childbirth: Principal | ICD-10-CM | POA: Diagnosis present

## 2021-11-17 DIAGNOSIS — Z87891 Personal history of nicotine dependence: Secondary | ICD-10-CM | POA: Diagnosis not present

## 2021-11-17 DIAGNOSIS — Z3A37 37 weeks gestation of pregnancy: Secondary | ICD-10-CM

## 2021-11-17 DIAGNOSIS — O26893 Other specified pregnancy related conditions, third trimester: Secondary | ICD-10-CM | POA: Diagnosis present

## 2021-11-17 LAB — CBC
HCT: 35.6 % — ABNORMAL LOW (ref 36.0–46.0)
Hemoglobin: 11.8 g/dL — ABNORMAL LOW (ref 12.0–15.0)
MCH: 27.1 pg (ref 26.0–34.0)
MCHC: 33.1 g/dL (ref 30.0–36.0)
MCV: 81.8 fL (ref 80.0–100.0)
Platelets: 242 10*3/uL (ref 150–400)
RBC: 4.35 MIL/uL (ref 3.87–5.11)
RDW: 14.1 % (ref 11.5–15.5)
WBC: 8.9 10*3/uL (ref 4.0–10.5)
nRBC: 0 % (ref 0.0–0.2)

## 2021-11-17 LAB — POCT FERN TEST: POCT Fern Test: POSITIVE

## 2021-11-17 MED ORDER — COCONUT OIL OIL: 1.0000 | TOPICAL_OIL | Status: DC | PRN

## 2021-11-17 MED ORDER — BENZOCAINE-MENTHOL 20-0.5 % EX AERO
1.0000 | INHALATION_SPRAY | CUTANEOUS | Status: DC | PRN
  Administered 2021-11-17: 1 via TOPICAL
  Filled 2021-11-17: qty 56

## 2021-11-17 MED ORDER — SENNOSIDES-DOCUSATE SODIUM 8.6-50 MG PO TABS
2.0000 | ORAL_TABLET | Freq: Every day | ORAL | Status: DC
  Administered 2021-11-18: 2 via ORAL
  Filled 2021-11-17: qty 2

## 2021-11-17 MED ORDER — SIMETHICONE 80 MG PO CHEW: 80.0000 mg | CHEWABLE_TABLET | ORAL | Status: DC | PRN

## 2021-11-17 MED ORDER — TETANUS-DIPHTH-ACELL PERTUSSIS 5-2.5-18.5 LF-MCG/0.5 IM SUSY: 0.5000 mL | PREFILLED_SYRINGE | Freq: Once | INTRAMUSCULAR | Status: DC

## 2021-11-17 MED ORDER — DIPHENHYDRAMINE HCL 25 MG PO CAPS: 25.0000 mg | ORAL_CAPSULE | Freq: Four times a day (QID) | ORAL | Status: DC | PRN

## 2021-11-17 MED ORDER — OXYCODONE-ACETAMINOPHEN 5-325 MG PO TABS: 2.0000 | ORAL_TABLET | ORAL | Status: DC | PRN

## 2021-11-17 MED ORDER — TERBUTALINE SULFATE 1 MG/ML IJ SOLN: 0.2500 mg | Freq: Once | INTRAMUSCULAR | Status: DC | PRN

## 2021-11-17 MED ORDER — ACETAMINOPHEN 325 MG PO TABS: 650.0000 mg | ORAL_TABLET | ORAL | Status: DC | PRN

## 2021-11-17 MED ORDER — DIBUCAINE (PERIANAL) 1 % EX OINT
1.0000 | TOPICAL_OINTMENT | CUTANEOUS | Status: DC | PRN
Start: 2021-11-17 — End: 2021-11-19

## 2021-11-17 MED ORDER — ONDANSETRON HCL 4 MG PO TABS: 4.0000 mg | ORAL_TABLET | ORAL | Status: DC | PRN

## 2021-11-17 MED ORDER — SOD CITRATE-CITRIC ACID 500-334 MG/5ML PO SOLN: 30.0000 mL | ORAL | Status: DC | PRN

## 2021-11-17 MED ORDER — PRENATAL MULTIVITAMIN CH
1.0000 | ORAL_TABLET | Freq: Every day | ORAL | Status: DC
  Administered 2021-11-18: 1 via ORAL
  Filled 2021-11-17: qty 1

## 2021-11-17 MED ORDER — ZOLPIDEM TARTRATE 5 MG PO TABS: 5.0000 mg | ORAL_TABLET | Freq: Every evening | ORAL | Status: DC | PRN

## 2021-11-17 MED ORDER — OXYCODONE-ACETAMINOPHEN 5-325 MG PO TABS: 1.0000 | ORAL_TABLET | ORAL | Status: DC | PRN

## 2021-11-17 MED ORDER — OXYTOCIN BOLUS FROM INFUSION
333.0000 mL | Freq: Once | INTRAVENOUS | Status: AC
  Administered 2021-11-17: 333 mL via INTRAVENOUS

## 2021-11-17 MED ORDER — OXYTOCIN-SODIUM CHLORIDE 30-0.9 UT/500ML-% IV SOLN
1.0000 m[IU]/min | INTRAVENOUS | Status: DC
  Administered 2021-11-17: 4 m[IU]/min via INTRAVENOUS
  Filled 2021-11-17: qty 500

## 2021-11-17 MED ORDER — WITCH HAZEL-GLYCERIN EX PADS: 1.0000 | MEDICATED_PAD | CUTANEOUS | Status: DC | PRN

## 2021-11-17 MED ORDER — OXYTOCIN-SODIUM CHLORIDE 30-0.9 UT/500ML-% IV SOLN: 2.5000 [IU]/h | INTRAVENOUS | Status: DC

## 2021-11-17 MED ORDER — HYDROXYZINE HCL 50 MG PO TABS: 50.0000 mg | ORAL_TABLET | Freq: Four times a day (QID) | ORAL | Status: DC | PRN

## 2021-11-17 MED ORDER — IBUPROFEN 600 MG PO TABS
600.0000 mg | ORAL_TABLET | Freq: Four times a day (QID) | ORAL | Status: DC
  Administered 2021-11-17 – 2021-11-18 (×4): 600 mg via ORAL
  Filled 2021-11-17 (×4): qty 1

## 2021-11-17 MED ORDER — LIDOCAINE HCL (PF) 1 % IJ SOLN: 30.0000 mL | INTRAMUSCULAR | Status: DC | PRN

## 2021-11-17 MED ORDER — LACTATED RINGERS IV SOLN: 500.0000 mL | INTRAVENOUS | Status: DC | PRN

## 2021-11-17 MED ORDER — FENTANYL CITRATE (PF) 100 MCG/2ML IJ SOLN
50.0000 ug | INTRAMUSCULAR | Status: DC | PRN
  Administered 2021-11-17: 50 ug via INTRAVENOUS
  Administered 2021-11-17: 100 ug via INTRAVENOUS
  Filled 2021-11-17 (×2): qty 2

## 2021-11-17 MED ORDER — ONDANSETRON HCL 4 MG/2ML IJ SOLN: 4.0000 mg | INTRAMUSCULAR | Status: DC | PRN

## 2021-11-17 MED ORDER — LACTATED RINGERS IV SOLN: INTRAVENOUS | Status: DC

## 2021-11-17 MED ORDER — ONDANSETRON HCL 4 MG/2ML IJ SOLN: 4.0000 mg | Freq: Four times a day (QID) | INTRAMUSCULAR | Status: DC | PRN

## 2021-11-17 NOTE — Lactation Note (Signed)
This note was copied from a baby's chart. Lactation Consultation Note  Patient Name: [name] VLDKC'C Date: 11/17/2021 Reason for consult: L&D Initial assessment;Early term 37-38.6wks Age:35 hours  BF parent holding abby STS, baby had his fist in his mouth suckling. LC used pillows for positioning, placed baby in football position. Placed dry cloth under breast for support. Reverse pressure to areola to soften.  Had to use t-cup hold to hold breast tissue in baby's mouth to suck. Once he finally figured it out he could then maintain latch. BF parent didn't BF her first 2 children but pumped for 5 months to her now 5 yr old. 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 BF parent hopes to be able to latch baby to breast but if not then she will pump and bottle feed like her last child.  BF parent will need shells to assist in removing edema and DEBP. LC will f/u on MBU. Maternal Data Does the patient have breastfeeding experience prior to this delivery?: Yes How long did the patient breastfeed?: pumped 5 months  Feeding    LATCH Score Latch: Repeated attempts needed to sustain latch, nipple held in mouth throughout feeding, stimulation needed to elicit sucking reflex.  Audible Swallowing: None  Type of Nipple: Everted at rest and after stimulation  Comfort (Breast/Nipple): Filling, red/small blisters or bruises, mild/mod discomfort (edema)  Hold (Positioning): Assistance needed to correctly position infant at breast and maintain latch.  LATCH Score: 5   Lactation Tools Discussed/Used    Interventions Interventions: Adjust position;Assisted with latch;Support pillows;Skin to skin;Position options;Breast compression  Discharge    Consult Status Consult Status: Follow-up from L&D Date: 11/18/21 Follow-up type: In-patient    Charyl Dancer 11/17/2021, 7:58 PM

## 2021-11-17 NOTE — Progress Notes (Signed)
Labor Progress Note [name] is a 36 y.o. [phone] at [redacted]w[redacted]d presented for SROM @1300  8/13 S: Patient is resting comfortably.  O:  BP 129/76   Pulse (!) 103   Temp 97.9 F (36.6 C) (Oral)   Resp 16   Ht 5\' 2"  (1.575 m)   Wt 96.2 kg   LMP 02/20/2021   SpO2 97%   BMI 38.78 kg/m  EFM: 125/+ accelerations/no decelerations  CVE: Dilation: 4 Effacement (%): 60 Station: -2 Presentation: Vertex Exam by:: 02/22/2021, RN   A&P: 36 y.o. [name] [redacted]w[redacted]d  #Labor: Progressing well. 4 x 4 pitocin  #Pain: unmedicated  #FWB: cat 1 #GBS negative   [id number], DO [facility]redacted][facility][redacted][facility], [facility] 5:47 PM

## 2021-11-17 NOTE — Progress Notes (Signed)
Pt informed that the ultrasound is considered a limited OB ultrasound and is not intended to be a complete ultrasound exam.  Patient also informed that the ultrasound is not being completed with the intent of assessing for fetal or placental anomalies or any pelvic abnormalities.  Explained that the purpose of today's ultrasound is to assess for  presentation.  Patient acknowledges the purpose of the exam and the limitations of the study.    Cephalic  Toni Abt, NP   

## 2021-11-17 NOTE — H&P (Addendum)
OBSTETRIC ADMISSION HISTORY AND PHYSICAL  [name] is a 36 y.o. female [phone] with IUP at [redacted]w[redacted]d by U/S presenting for SROM w/ + fern test. She reports +FMs, No LOF, no VB, no blurry vision, headaches or peripheral edema, and RUQ pain.  She plans on breast feeding. She requests POP for birth control. She received her prenatal care at  [facility]    Dating: By U/S --->  Estimated Date of Delivery: 12/06/21  Sono:    @[redacted]w[redacted]d , CWD, normal anatomy, vertex presentation,  2239g, 65% EFW   Prenatal History/Complications:    Past Medical History: Past Medical History:  Diagnosis Date   Depression    GERD (gastroesophageal reflux disease)    Hx of percutaneous transcatheter closure of congenital VSD    Migraine headache with aura    Panic attack     Past Surgical History: Past Surgical History:  Procedure Laterality Date   CARDIAC SURGERY     as a baby   TONSILLECTOMY      Obstetrical History: OB History     Gravida  4   Para  3   Term  3   Preterm      AB      Living  3      SAB      IAB      Ectopic      Multiple  0   Live Births  3           Social History Social History   Socioeconomic History   Marital status: Divorced    Spouse name: Not on file   Number of children: Not on file   Years of education: Not on file   Highest education level: Not on file  Occupational History   Not on file  Tobacco Use   Smoking status: Former    Types: Cigarettes    Quit date: 08/16/2010    Years since quitting: 11.2   Smokeless tobacco: Never  Vaping Use   Vaping Use: Never used  Substance and Sexual Activity   Alcohol use: Not Currently    Comment: last drink drink early September   Drug use: No   Sexual activity: Yes    Partners: Male    Birth control/protection: None  Other Topics Concern   Not on file  Social History Narrative   Not on file   Social Determinants of Health   Financial Resource Strain: Not on file  Food Insecurity: No Food  Insecurity (09/25/2020)   Hunger Vital Sign    Worried About Running Out of Food in the Last Year: Never true    Ran Out of Food in the Last Year: Never true  Transportation Needs: No Transportation Needs (09/25/2020)   PRAPARE - 09/27/2020 (Medical): No    Lack of Transportation (Non-Medical): No  Physical Activity: Not on file  Stress: Not on file  Social Connections: Not on file    Family History: Family History  Problem Relation Age of Onset   Healthy Mother    Hypertension Father    Diabetes Father    Asthma Paternal Aunt    Congestive Heart Failure Paternal Uncle    Cancer Paternal Grandmother        breast   Stroke Paternal Grandfather    Asthma Cousin    Congestive Heart Failure Other    Heart disease Neg Hx     Allergies: No Known Allergies  Pt denies allergies to latex,  iodine, or shellfish.  Medications Prior to Admission  Medication Sig Dispense Refill Last Dose   Prenatal Vit-Fe Fumarate-FA (MULTIVITAMIN-PRENATAL) 27-0.8 MG TABS tablet Take 1 tablet by mouth daily at 12 noon.   11/16/2021     Review of Systems   All systems reviewed and negative except as stated in HPI  Blood pressure 129/76, pulse (!) 103, temperature 97.9 F (36.6 C), temperature source Oral, resp. rate 16, height 5\' 2"  (1.575 m), weight 96.2 kg, last menstrual period 02/20/2021, SpO2 97 %, not currently breastfeeding. General appearance: alert and cooperative Lungs: clear to auscultation bilaterally Heart: regular rate and rhythm Abdomen: soft, non-tender; bowel sounds normal Extremities: Homans sign is negative, no sign of DVT Presentation: cephalic Fetal monitoringBaseline: 150 bpm, Variability: Good {> 6 bpm), Accelerations: Reactive, and Decelerations: Absent Uterine activityNone Dilation: 4 Effacement (%): 60 Station: -2 Exam by:: [email or web address], RN   Prenatal labs: ABO, Rh: --/--/A POS (08/13 1500) Antibody: NEG (08/13 1500) Rubella: 1.47  (02/14 1123) RPR: Non Reactive (06/13 0928)  HBsAg: Negative (02/14 1123)  HIV: Non Reactive (06/13 0928)  GBS: Negative/-- (08/08 1100)  2 hr Glucola: normal  Genetic screening:  low risk  Anatomy 01-04-1992: normal   Prenatal Transfer Tool  Maternal Diabetes: No Genetic Screening: Normal Maternal Ultrasounds/Referrals: Normal Fetal Ultrasounds or other Referrals:  None Maternal Substance Abuse:  No Significant Maternal Medications:  None Significant Maternal Lab Results: Group B Strep negative  Results for orders placed or performed during the hospital encounter of 11/17/21 (from the past 24 hour(s))  Fern Test   Collection Time: 11/17/21  2:41 PM  Result Value Ref Range   POCT Fern Test Positive = ruptured amniotic membanes   CBC   Collection Time: 11/17/21  3:00 PM  Result Value Ref Range   WBC 8.9 4.0 - 10.5 K/uL   RBC 4.35 3.87 - 5.11 MIL/uL   Hemoglobin 11.8 (L) 12.0 - 15.0 g/dL   HCT 11/19/21 (L) 29.7 - 98.9 %   MCV 81.8 80.0 - 100.0 fL   MCH 27.1 26.0 - 34.0 pg   MCHC 33.1 30.0 - 36.0 g/dL   RDW 21.1 94.1 - 74.0 %   Platelets 242 150 - 400 K/uL   nRBC 0.0 0.0 - 0.2 %  Type and screen [facility]   Collection Time: 11/17/21  3:00 PM  Result Value Ref Range   ABO/RH(D) A POS    Antibody Screen NEG    Sample Expiration      11/20/2021,2359 Performed at [facility], [address] [place], [place]     Patient Active Problem List   Diagnosis Date Noted   Normal labor 11/17/2021   Obesity in pregnancy, antepartum 08/20/2021   Depression 05/21/2021   S/P VSD repair    Supervision of other normal pregnancy, antepartum 04/19/2021   Migraine headache with aura    GERD (gastroesophageal reflux disease) 12/01/2006    Assessment/Plan:  [name] is a 36 y.o. G4P3003 at [redacted]w[redacted]d here for SROM   #Labor: Augmentation with pitocin planned.  #Pain: Unmedicated, unmedicated with all prior deliveries.  #FWB: Cat 1 #ID:  GBS neg #MOF:  breast #MOC: POP, talked about LARCs with patient  #Circ:  NO  [redacted]w[redacted]d, DO  _________________________________  GME ATTESTATION:  Evaluation and management procedures were performed by the [facility] Medicine Resident under my supervision. I was immediately available for direct supervision, assistance and direction throughout this encounter.  I also confirm that  I have verified the information documented in the resident's note, and that I have also personally reperformed the pertinent components of the physical exam and all of the medical decision making activities.  I have also made any necessary editorial changes.  Myrtie Hawk, DO OB Fellow, Faculty [facility], Center for [facility] 11/17/2021 5:26 PM

## 2021-11-17 NOTE — Discharge Summary (Addendum)
Postpartum Discharge Summary  Date of Service updated 11/18/21    Patient Name: [name] DOB: 11-01-1985 MRN: [id number]  Date of admission: 11/17/2021 Delivery date:11/17/2021  Delivering provider: Shelda Pal  Date of discharge: 11/18/2021  Admitting diagnosis: Normal labor [O80, Z37.9] Intrauterine pregnancy: [redacted]w[redacted]d    Secondary diagnosis:  Principal Problem:   Normal labor  Additional problems: None    Discharge diagnosis: Term Pregnancy Delivered                                              Post partum procedures: none Augmentation: Pitocin Complications: None  Hospital course: Onset of Labor With Vaginal Delivery      36y.o. yo [id number] 35w2das admitted in Latent Labor on 11/17/2021. Patient had an uncomplicated labor course as follows:  Membrane Rupture Time/Date: 1:30 PM ,11/17/2021   Delivery Method:Vaginal, Spontaneous  Episiotomy: None  Lacerations:  None  Patient had an uncomplicated postpartum course.  She is ambulating, tolerating a regular diet, passing flatus, and urinating well. Patient is discharged home in stable condition on 11/18/21.  Newborn Data: Birth date:11/17/2021  Birth time:6:52 PM  Gender:Female  Living status:Living  Apgars:9 ,9  Weight:3410 g   Magnesium Sulfate received: No BMZ received: No Rhophylac:N/A MMR:N/A T-DaP: n/a Flu: N/A Transfusion:No  Physical exam  Vitals:   11/17/21 2146 11/18/21 0212 11/18/21 0558 11/18/21 1036  BP: 126/85 122/80 129/76   Pulse: 100 100 96 (!) 108  Resp: 18 18 18 18   Temp: 98.4 F (36.9 C) 98.2 F (36.8 C) 98.1 F (36.7 C) 98.2 F (36.8 C)  TempSrc: Oral Oral Oral Oral  SpO2: 98% 96% 98% 98%  Weight:      Height:       General: alert, cooperative, and no distress Lochia: appropriate Uterine Fundus: firm Incision: N/A DVT Evaluation: No evidence of DVT seen on physical exam. Labs: Lab Results  Component Value Date   WBC 9.7 11/18/2021   HGB 10.3 (L)  11/18/2021   HCT 30.3 (L) 11/18/2021   MCV 81.2 11/18/2021   PLT 200 11/18/2021      Latest Ref Rng & Units 09/27/2020    4:33 AM  CMP  Glucose 70 - 99 mg/dL 90   BUN 6 - 20 mg/dL 10   Creatinine 0.44 - 1.00 mg/dL 0.54   Sodium 135 - 145 mmol/L 134   Potassium 3.5 - 5.1 mmol/L 4.5   Chloride 98 - 111 mmol/L 107   CO2 22 - 32 mmol/L 18   Calcium 8.9 - 10.3 mg/dL 8.8   Total Protein 6.5 - 8.1 g/dL 5.9   Total Bilirubin 0.3 - 1.2 mg/dL 0.4   Alkaline Phos 38 - 126 U/L 159   AST 15 - 41 U/L 31   ALT 0 - 44 U/L 7    Edinburgh Score:    10/25/2020    1:53 PM  Edinburgh Postnatal Depression Scale Screening Tool  I have been able to laugh and see the funny side of things. 0  I have looked forward with enjoyment to things. 0  I have blamed myself unnecessarily when things went wrong. 0  I have been anxious or worried for no good reason. 0  I have felt scared or panicky for no good reason. 0  Things have been getting on top of me. 0  I have been so unhappy that I have had difficulty sleeping. 0  I have felt sad or miserable. 0  I have been so unhappy that I have been crying. 0  The thought of harming myself has occurred to me. 0  Edinburgh Postnatal Depression Scale Total 0     After visit meds:  Allergies as of 11/18/2021   No Known Allergies      Medication List     TAKE these medications    acetaminophen 325 MG tablet Commonly known as: Tylenol Take 2 tablets (650 mg total) by mouth every 4 (four) hours as needed (for pain scale < 4).   ibuprofen 600 MG tablet Commonly known as: ADVIL Take 1 tablet (600 mg total) by mouth every 6 (six) hours.   multivitamin-prenatal 27-0.8 MG Tabs tablet Take 1 tablet by mouth daily at 12 noon.   senna-docusate 8.6-50 MG tablet Commonly known as: Senokot-S Take 2 tablets by mouth daily.   Slynd 4 MG Tabs Generic drug: Drospirenone Take 1 tablet by mouth daily.         Discharge home in stable condition Infant  Feeding: Breast Infant Disposition:home with mother Discharge instruction: per After Visit Summary and Postpartum booklet. Activity: Advance as tolerated. Pelvic rest for 6 weeks.  Diet: routine diet Future Appointments: Future Appointments  Date Time Provider [place]  12/31/2021  9:55 AM Radene Gunning, MD [facility]   Follow up Visit:  Message sent on 11/18/21 to [facility] by Clarnce Flock, MD Please schedule this patient for a In person postpartum visit in 6 weeks with the following provider: Any provider. Additional Postpartum F/U: none   Low risk pregnancy complicated by:  none Delivery mode:  Vaginal, Spontaneous  Anticipated Birth Control:  POPs sent on DC   11/18/2021 Shelda Pal, DO    Attestation of Attending Supervision of Obstetric Fellow: Evaluation and management procedures were performed by the Obstetric Fellow under my supervision and collaboration.  I have reviewed the Obstetric Fellow's note and chart, and I agree with the management and plan as documented.  Clarnce Flock, MD Family Medicine Attending, [facility] for [facility] 11/18/2021 2:41 PM

## 2021-11-17 NOTE — MAU Note (Signed)
.  [name] is a 36 y.o. at [redacted]w[redacted]d here in MAU reporting: SROM at 1300 with clear fluid.  Denies reg ctx. Reports some bloody show.  +FM  Onset of complaint: 1300  Pain score: 0 There were no vitals filed for this visit.   FHT: 155

## 2021-11-18 LAB — TYPE AND SCREEN
ABO/RH(D): A POS
Antibody Screen: NEGATIVE

## 2021-11-18 LAB — CBC
HCT: 30.3 % — ABNORMAL LOW (ref 36.0–46.0)
Hemoglobin: 10.3 g/dL — ABNORMAL LOW (ref 12.0–15.0)
MCH: 27.6 pg (ref 26.0–34.0)
MCHC: 34 g/dL (ref 30.0–36.0)
MCV: 81.2 fL (ref 80.0–100.0)
Platelets: 200 10*3/uL (ref 150–400)
RBC: 3.73 MIL/uL — ABNORMAL LOW (ref 3.87–5.11)
RDW: 14.3 % (ref 11.5–15.5)
WBC: 9.7 10*3/uL (ref 4.0–10.5)
nRBC: 0 % (ref 0.0–0.2)

## 2021-11-18 LAB — RPR: RPR Ser Ql: NONREACTIVE

## 2021-11-18 MED ORDER — SLYND 4 MG PO TABS: 1.0000 | ORAL_TABLET | Freq: Every day | ORAL | 2 refills | Status: DC

## 2021-11-18 MED ORDER — SENNOSIDES-DOCUSATE SODIUM 8.6-50 MG PO TABS: 2.0000 | ORAL_TABLET | Freq: Every day | ORAL | 0 refills | Status: AC

## 2021-11-18 MED ORDER — ACETAMINOPHEN 325 MG PO TABS: 650.0000 mg | ORAL_TABLET | ORAL | 0 refills | Status: AC | PRN

## 2021-11-18 MED ORDER — IBUPROFEN 600 MG PO TABS: 600.0000 mg | ORAL_TABLET | Freq: Four times a day (QID) | ORAL | 0 refills | Status: DC

## 2021-11-18 NOTE — Progress Notes (Addendum)
POSTPARTUM PROGRESS NOTE  Subjective: [name] is a 36 y.o. [id number] s/p SVD at [redacted]w[redacted]d.  She reports she doing well. No acute events overnight. She denies any problems with ambulating, voiding or po intake. Denies nausea or vomiting. She has passed flatus. Pain is well controlled.  Lochia is appropriate.  Objective: Blood pressure 129/76, pulse 96, temperature 98.1 F (36.7 C), temperature source Oral, resp. rate 18, height 5\' 2"  (1.575 m), weight 96.2 kg, last menstrual period 02/20/2021, SpO2 98 %, unknown if currently breastfeeding.  Physical Exam:  General: alert, cooperative and no distress Chest: no respiratory distress Abdomen: soft, non-tender  Uterine Fundus: firm and at level of umbilicus Extremities: No calf swelling or tenderness  no edema  Recent Labs    11/17/21 1500 11/18/21 0505  HGB 11.8* 10.3*  HCT 35.6* 30.3*    Assessment/Plan: [name] is a 37 y.o. 20 s/p SVD at [redacted]w[redacted]d.  Routine Postpartum Care: Doing well, pain well-controlled.  -- Continue routine care, lactation support  -- Contraception: OP POPs planned -- Feeding: breast  Dispo: Plan for discharge tonight.  [redacted]w[redacted]d, DO Faculty Practice, Center for [facility] 11/18/2021 7:59 AM _________________________________________ GME ATTESTATION:  Evaluation and management procedures were performed by the [facility] Medicine Resident under my supervision. I was immediately available for direct supervision, assistance and direction throughout this encounter.  I also confirm that I have verified the information documented in the resident's note, and that I have also personally reperformed the pertinent components of the physical exam and all of the medical decision making activities.  I have also made any necessary editorial changes.  [facility], DO OB Fellow, Faculty [facility], Center for [facility] 11/18/2021 10:58 AM

## 2021-11-18 NOTE — Lactation Note (Signed)
This note was copied from a baby's chart. Lactation Consultation Note  Patient Name: [name] [id number] Date: 11/18/2021 Reason for consult: Follow-up assessment;Early term 37-38.6wks Age:35 hours  P4: Early term infant at 45+2 Feeding preference: Breast  "[name]" was awake and alert laying near birth parent when I arrived.  Parent stated that he has been latching and feeding well. She is excited because her other three children were not able to breast feed.  Birth parent had no questions/concerns at this time.  Encouraged to feed at least every three hours due to gestational age or sooner if "[name]" shows cues. Continue lots of STS, breast massage and hand expression. Suggested she call her RN/LC for latch assistance as needed. Support person present.    Maternal Data Has patient been taught Hand Expression?: Yes Does the patient have breastfeeding experience prior to this delivery?: No (Pumped and bottle fed third child for 5 months)  Feeding Mother's Current Feeding Choice: Breast Milk  LATCH Score                    Lactation Tools Discussed/Used    Interventions Interventions: Education  Discharge Pump: Personal  Consult Status Consult Status: Follow-up Date: 11/19/21 Follow-up type: In-patient    Dora Sims 11/18/2021, 6:48 PM

## 2021-11-18 NOTE — Social Work (Signed)
CSW received consult for hx of Depression.  CSW met with MOB to offer support and complete assessment. CSW entered the room introduced self, CSW role and reason for visit. MOB was agreeable to visit. CSW inquired about how MOB was feeling since giving birth and the delivery process. MOB reported she was doing well and  delivery was extremely fast. CSW congratulated MOB on baby "Christian".   CSW inquired about MOB Mental Health history, MOB reported she was dealing with depression and anxiety about 2/3 years ago when she was going through a custody battle with her Ex husband. MOB reported her son was kept form her and caused her depression and anxiety. MOB reported she was put on Buspar but stopped it because it made her feel weird , she was then switched to Prozac which work for her. MOB reported she no longer takes the medication and reports stable mood since and throughout pregnancy. CSW inquired about therapy, MOB reported none. CSW assessed for safety, MOB denied any SI or HI.    CSW provided education regarding the baby blues period vs. perinatal mood disorders, discussed treatment and gave resources for mental health follow up if concerns arise.  CSW recommends self-evaluation during the postpartum time period using the New Mom Checklist from Postpartum Progress and encouraged MOB to contact a medical professional if symptoms are noted at any time.    CSW provided review of Sudden Infant Death Syndrome (SIDS) precautions. MOB identified [facility] for infants follow up care. MOB reported they have all necessary items for the infant including a crib for safe sleep.   CSW identifies no further need for intervention and no barriers to discharge at this time.   Jaimere Feutz, LCSWA Clinical Social Worker [phone] 

## 2021-11-18 NOTE — Lactation Note (Signed)
This note was copied from a baby's chart. Lactation Consultation Note  Patient Name: [name] [id number] Date: 11/18/2021 Reason for consult: Initial assessment;Early term 37-38.6wks Age:35 hours BF parent stated that the baby has been latching well and has BF 2 more times since [facility] assisted in L&D. BF parent asked for hand pump to use for pre-pumping before latching. Suggested shells, BF parent stated yes those are very helpful. She has worn them before with her last child. BF parent stated she has no questions at this time and feels that BF is going well so far. Newborn feeding habits, STS, I&O reviewed. Mom encouraged to feed baby 8-12 times/24 hours and with feeding cues.   Encouraged to call for assistance or questions.  Maternal Data Does the patient have breastfeeding experience prior to this delivery?: Yes  Feeding    LATCH Score                    Lactation Tools Discussed/Used Tools: Shells;Pump Breast pump type: Manual Pump Education: Setup, frequency, and cleaning Reason for Pumping: pre-pump/mom requested hand pump Pumping frequency: pre-pumping  Interventions Interventions: Breast feeding basics reviewed;Shells;Hand pump;LC Services brochure  Discharge    Consult Status Consult Status: Follow-up Date: 11/18/21 Follow-up type: In-patient    Aydden Cumpian, Diamond Nickel 11/18/2021, 2:13 AM

## 2021-11-19 ENCOUNTER — Encounter: Payer: Medicaid Other | Admitting: Obstetrics and Gynecology

## 2021-11-26 ENCOUNTER — Encounter: Payer: Medicaid Other | Admitting: Obstetrics and Gynecology

## 2021-11-27 ENCOUNTER — Telehealth (HOSPITAL_COMMUNITY): Payer: Self-pay | Admitting: *Deleted

## 2021-11-27 NOTE — Telephone Encounter (Signed)
Attempted Hospital Discharge Follow-Up Call.  Left voice mail requesting that patient return RN's phone call if patient has any concerns or questions regarding herself or her baby.  

## 2021-12-03 ENCOUNTER — Encounter: Payer: Medicaid Other | Admitting: Obstetrics & Gynecology

## 2021-12-06 ENCOUNTER — Inpatient Hospital Stay (HOSPITAL_COMMUNITY): Admit: 2021-12-06 | Payer: Self-pay

## 2021-12-07 ENCOUNTER — Ambulatory Visit (HOSPITAL_COMMUNITY)
Admission: EM | Admit: 2021-12-07 | Discharge: 2021-12-07 | Disposition: A | Discharge status: Home / Self Care | Payer: Medicaid Other | Attending: Urgent Care | Admitting: Urgent Care

## 2021-12-07 ENCOUNTER — Other Ambulatory Visit: Payer: Self-pay

## 2021-12-07 ENCOUNTER — Encounter (HOSPITAL_COMMUNITY): Payer: Self-pay | Admitting: *Deleted

## 2021-12-07 DIAGNOSIS — N61 Mastitis without abscess: Secondary | ICD-10-CM | POA: Diagnosis not present

## 2021-12-07 MED ORDER — CEPHALEXIN 500 MG PO CAPS: 500.0000 mg | ORAL_CAPSULE | Freq: Four times a day (QID) | ORAL | 0 refills | Status: AC

## 2021-12-07 NOTE — ED Triage Notes (Signed)
Pt reports started having breast pain on WED. Both breast are tender Lt worse than Rt. PT is breast feeding.

## 2021-12-07 NOTE — Discharge Instructions (Addendum)
Please take the antibiotic 4 times daily for 7 to 10 days. Feed until the breasts are completely emptied. When pumping, do not stop pumping if the milk is still coming out.  You must completely empty the breast. Make sure he is properly latching to prevent abnormal letdown. Continue with warm compresses, do it as many times daily as you can, afterwards have him latch and suck or use the pump with a strong pump section.  Gently milked the duct and try to compress any blocked milk. If you develop a fever, please head to the ER.

## 2021-12-07 NOTE — ED Provider Notes (Signed)
[facility]    CSN: [id number] Arrival date & time: 12/07/21  1620      History   Chief Complaint Chief Complaint  Patient presents with   Breast Problem    HPI SHONICE WRISLEY is a 36 y.o. female.   Pleasant 36 year old female presents today due to concerns of bilateral breast pain, but much worse on the left medial aspect around 8:00.  She is currently breast-feeding, has a 72-week-old son.  Has had mastitis in the past, states this feels similar.  She reports a good latch, states she pumps frequently.  Felt chills but has not had a fever.  Feels like the pain is deep in her breast.  No bloody discharge.  No skin rash. Son recently tested positive for RSV.     Past Medical History:  Diagnosis Date   Depression    GERD (gastroesophageal reflux disease)    Hx of percutaneous transcatheter closure of congenital VSD    Migraine headache with aura    Panic attack     Patient Active Problem List   Diagnosis Date Noted   Normal labor 11/17/2021   Obesity in pregnancy, antepartum 08/20/2021   Depression 05/21/2021   S/P VSD repair    Supervision of other normal pregnancy, antepartum 04/19/2021   Migraine headache with aura    GERD (gastroesophageal reflux disease) 12/01/2006    Past Surgical History:  Procedure Laterality Date   CARDIAC SURGERY     as a baby   TONSILLECTOMY      OB History     Gravida  4   Para  4   Term  4   Preterm      AB      Living  4      SAB      IAB      Ectopic      Multiple  0   Live Births  4            Home Medications    Prior to Admission medications   Medication Sig Start Date End Date Taking? Authorizing Provider  cephALEXin (KEFLEX) 500 MG capsule Take 1 capsule (500 mg total) by mouth 4 (four) times daily for 10 days. 12/07/21 12/17/21 Yes Avagail Whittlesey L, PA  acetaminophen (TYLENOL) 325 MG tablet Take 2 tablets (650 mg total) by mouth every 4 (four) hours as needed (for pain scale < 4).  11/18/21 12/18/21  Mercado-Ortiz, Lahoma Crocker, DO  Calcium Carbonate Antacid (TUMS PO) Take 2 tablets by mouth daily as needed (heartburn).    [provider]  Drospirenone (SLYND) 4 MG TABS Take 1 tablet by mouth daily. 11/18/21 12/18/21  Mercado-Ortiz, Lahoma Crocker, DO  ibuprofen (ADVIL) 600 MG tablet Take 1 tablet (600 mg total) by mouth every 6 (six) hours. 11/18/21   Mercado-Ortiz, Lahoma Crocker, DO  Prenatal Vit-Fe Fumarate-FA (PRENATAL PO) Take 2 tablets by mouth daily.    [provider]  senna-docusate (SENOKOT-S) 8.6-50 MG tablet Take 2 tablets by mouth daily. 11/18/21 12/18/21  Mercado-Ortiz, Lahoma Crocker, DO    Family History Family History  Problem Relation Age of Onset   Healthy Mother    Hypertension Father    Diabetes Father    Asthma Paternal Aunt    Congestive Heart Failure Paternal Uncle    Cancer Paternal Grandmother        breast   Stroke Paternal Grandfather    Asthma Cousin    Congestive Heart Failure Other  Heart disease Neg Hx     Social History Social History   Tobacco Use   Smoking status: Former    Types: Cigarettes    Quit date: 08/16/2010    Years since quitting: 11.3   Smokeless tobacco: Never  Vaping Use   Vaping Use: Never used  Substance Use Topics   Alcohol use: Not Currently    Comment: last drink drink early September   Drug use: No     Allergies   Patient has no known allergies.   Review of Systems Review of Systems As per HPI  Physical Exam Triage Vital Signs ED Triage Vitals  Enc Vitals Group     BP 12/07/21 1702 130/84     Pulse Rate 12/07/21 1702 100     Resp 12/07/21 1702 18     Temp 12/07/21 1702 98.3 F (36.8 C)     Temp src --      SpO2 12/07/21 1702 99 %     Weight --      Height --      Head Circumference --      Peak Flow --      Pain Score 12/07/21 1700 9     Pain Loc --      Pain Edu? --      Excl. in GC? --    No data found.  Updated Vital Signs BP 130/84   Pulse 100   Temp 98.3 F (36.8 C)    Resp 18   LMP 02/20/2021 Comment: recent birth 11-17-21  SpO2 99%   Breastfeeding Yes   Visual Acuity Right Eye Distance:   Left Eye Distance:   Bilateral Distance:    Right Eye Near:   Left Eye Near:    Bilateral Near:     Physical Exam Vitals and nursing note reviewed.  Constitutional:      General: She is not in acute distress.    Appearance: Normal appearance. She is normal weight. She is not ill-appearing or toxic-appearing.  HENT:     Head: Normocephalic and atraumatic.  Cardiovascular:     Rate and Rhythm: Normal rate.  Pulmonary:     Effort: Pulmonary effort is normal. No respiratory distress.  Chest:  Breasts:    Right: No swelling, bleeding, inverted nipple, mass, nipple discharge or skin change.     Left: Tenderness present. No swelling, bleeding, inverted nipple, mass, nipple discharge or skin change.       Comments: Location of tenderness and slight induration. No erythema of skin, no abscess formation Neurological:     Mental Status: She is alert.      UC Treatments / Results  Labs (all labs ordered are listed, but only abnormal results are displayed) Labs Reviewed - No data to display  EKG   Radiology No results found.  Procedures Procedures (including critical care time)  Medications Ordered in UC Medications - No data to display  Initial Impression / Assessment and Plan / UC Course  I have reviewed the triage vital signs and the nursing notes.  Pertinent labs & imaging results that were available during my care of the patient were reviewed by me and considered in my medical decision making (see chart for details).     Mastitis without abscess -we will start Keflex 4 times daily x7 to 10 days.  Discussed other supportive measures to help relieve any blocked duct.  Warm compresses, gentle massage. RTC precautions reviewed.   Final Clinical Impressions(s) / UC Diagnoses  Final diagnoses:  Mastitis without abscess     Discharge  Instructions      Please take the antibiotic 4 times daily for 7 to 10 days. Feed until the breasts are completely emptied. When pumping, do not stop pumping if the milk is still coming out.  You must completely empty the breast. Make sure he is properly latching to prevent abnormal letdown. Continue with warm compresses, do it as many times daily as you can, afterwards have him latch and suck or use the pump with a strong pump section.  Gently milked the duct and try to compress any blocked milk. If you develop a fever, please head to the ER.     ED Prescriptions     Medication Sig Dispense Auth. Provider   cephALEXin (KEFLEX) 500 MG capsule Take 1 capsule (500 mg total) by mouth 4 (four) times daily for 10 days. 40 capsule Nabila Albarracin L, PA      PDMP not reviewed this encounter.   Maretta Bees, [place] 12/07/21 [phone]

## 2021-12-12 ENCOUNTER — Telehealth: Payer: Medicaid Other | Admitting: Physician Assistant

## 2021-12-12 DIAGNOSIS — M545 Low back pain, unspecified: Secondary | ICD-10-CM

## 2021-12-12 NOTE — Progress Notes (Signed)
For the safety of you and your child, I recommend a face to face office visit with a health care provider.  Many mothers need to take medicines during their pregnancy and while nursing.  Almost all medicines pass into the breast milk in small quantities.  Most are generally considered safe for a mother to take but some medicines must be avoided.  After reviewing your E-Visit request, I recommend that you consult your OB/GYN or pediatrician for medical advice in relation to your condition and prescription medications while pregnant or breastfeeding.  NOTE:  There will be NO CHARGE for this eVisit  If you are having a true medical emergency please call 911.    For an urgent face to face visit, [place] has six urgent care centers for your convenience:     [facility] [place] Get Driving Directions [phone] [address] [place], [place] [address]    [facility] ([employer]) Get Driving Directions [phone] [address] [place], [place] [address]  [facility] ([place]) Get Driving Directions [phone] [address] ,  [place]  [address]  [facility] Get Driving Directions [phone] [address], [address] [place], [place] [address]   [facility] [place] Get Driving Directions  [phone] [address] [place], [place] [address]   [facility] [place] Get Driving Directions [phone] [address] ,  [address]  Your MyChart E-visit questionnaire answers were reviewed by a board certified advanced clinical practitioner to complete your personal care plan based on your specific symptoms.  Thank you for using e-Visits. 

## 2021-12-13 ENCOUNTER — Ambulatory Visit: Payer: Medicaid Other

## 2021-12-13 ENCOUNTER — Ambulatory Visit
Admission: RE | Admit: 2021-12-13 | Discharge: 2021-12-13 | Disposition: A | Discharge status: Home / Self Care | Payer: Medicaid Other | Source: Ambulatory Visit | Attending: Urgent Care | Admitting: Urgent Care

## 2021-12-13 VITALS — BP 121/84 | HR 97 | Temp 98.4°F | Resp 18 | Ht 62.0 in | Wt 195.0 lb

## 2021-12-13 DIAGNOSIS — M545 Low back pain, unspecified: Secondary | ICD-10-CM

## 2021-12-13 LAB — POCT URINALYSIS DIP (MANUAL ENTRY)
Bilirubin, UA: NEGATIVE
Glucose, UA: NEGATIVE mg/dL
Ketones, POC UA: NEGATIVE mg/dL
Nitrite, UA: NEGATIVE
Protein Ur, POC: NEGATIVE mg/dL
Spec Grav, UA: 1.025 (ref 1.010–1.025)
Urobilinogen, UA: 0.2 E.U./dL
pH, UA: 5.5 (ref 5.0–8.0)

## 2021-12-13 NOTE — ED Provider Notes (Addendum)
[facility]   [id number] 12/13/21 Arrival Time: 4193  ASSESSMENT & PLAN:  1. Acute left-sided low back pain without sciatica    Musculoskeletal exam is unremarkable. No tenderness with palpation. No radiculopathy.  No weakness or sensory changes.  No saddle paresthesias or changes in bowel or bladder function.  No CVA tenderness.  UA positive for large blood and trace leukocytes.  She is 1 month postpartum and continues to have vaginal spotting.  Her reported flank pain makes me concerned for urinary tract however this is not supported by the UA results.  Will send urine for confirmatory culture and contact patient if significant.  Otherwise, discussed diagnosis most likely is muscular skeletal, secondary to her multiple pregnancies, overweight, strain of carrying her 2 children.  Recommended weight loss, physical therapy for core strengthening.  Asked her to follow-up with her primary care provider.  Imaging: No results found.    No orders of the defined types were placed in this encounter.   [place] Controlled Substances Registry consulted for this patient. I feel the risk/benefit ratio today is favorable for proceeding with this prescription for a controlled substance. Medication sedation precautions given.  Reviewed expectations re: course of current medical issues. Questions answered. Outlined signs and symptoms indicating need for more acute intervention. Patient verbalized understanding. After Visit Summary given.  SUBJECTIVE: History from: patient. [name] is a 36 y.o. female who reports persistent pain of her L lumbar spine with L sided pain "under my ribs" and L flank when she "breathes hard".  Back Pain: Patient presents for presents evaluation of low back problems.  Symptoms have been present for 3 days and include pain in L lower back (constant aching) with intermittent sharp pain "under my ribs" (L side) and flank (5/10 in severity). Initial inciting  event: none. Symptoms are worst: constant. Alleviating factors identifiable by patient are sitting. Exacerbating factors identifiable by patient are  breathing deeply . Treatments so far initiated by patient:  ibuprofen  Previous lower back problems: none. Previous workup: none. Previous treatments: none.  Already taking keflex for mastitis prescribed in urgent care.  Endorses well hydrating.   Denies pain with urination or suprapubic pain.  She states she thought the pain was gas. Had similar a few weeks ago and took "gas relief". Thought constipation - no bathrom a few days. Took miralax but no resolution. Not "super painful". Worsened by "breathing hard" while standing. Primarily lower L side. But moves "under rib" L side and L flank.  ROS: As per HPI.   OBJECTIVE:  Vitals:   12/13/21 0903 12/13/21 0906  BP: 121/84   Pulse: 97   Resp: 18   Temp: 98.4 F (36.9 C)   SpO2: 96%   Weight:  195 lb (88.5 kg)  Height:  5\' 2"  (1.575 m)    General appearance: alert; no distress MSK: no cyanosis or edema; symmetrical with no gross deformities; no tenderness, no swelling, no bruising, normal ROM. No CVA tenderness CV: normal extremity capillary refill Skin: warm and dry Neurologic: normal gait; normal symmetric reflexes in all extremities; normal sensation in all extremities Psychological: alert and cooperative; normal mood and affect  Imaging: No results found.  No Known Allergies  Past Medical History:  Diagnosis Date   Depression    GERD (gastroesophageal reflux disease)    Hx of percutaneous transcatheter closure of congenital VSD    Migraine headache with aura    Panic attack    Social History   Socioeconomic  History   Marital status: Single    Spouse name: Not on file   Number of children: Not on file   Years of education: Not on file   Highest education level: Not on file  Occupational History   Not on file  Tobacco Use   Smoking status: Former    Types:  Cigarettes    Quit date: 08/16/2010    Years since quitting: 11.3   Smokeless tobacco: Never  Vaping Use   Vaping Use: Never used  Substance and Sexual Activity   Alcohol use: Not Currently    Comment: last drink drink early September   Drug use: No   Sexual activity: Yes    Partners: Male    Birth control/protection: None  Other Topics Concern   Not on file  Social History Narrative   Not on file   Social Determinants of Health   Financial Resource Strain: Not on file  Food Insecurity: No Food Insecurity (09/25/2020)   Hunger Vital Sign    Worried About Running Out of Food in the Last Year: Never true    Ran Out of Food in the Last Year: Never true  Transportation Needs: No Transportation Needs (09/25/2020)   PRAPARE - Administrator, Civil Service (Medical): No    Lack of Transportation (Non-Medical): No  Physical Activity: Not on file  Stress: Not on file  Social Connections: Not on file  Intimate Partner Violence: Not on file   Family History  Problem Relation Age of Onset   Healthy Mother    Hypertension Father    Diabetes Father    Asthma Paternal Aunt    Congestive Heart Failure Paternal Uncle    Cancer Paternal Grandmother        breast   Stroke Paternal Grandfather    Asthma Cousin    Congestive Heart Failure Other    Heart disease Neg Hx    Past Surgical History:  Procedure Laterality Date   CARDIAC SURGERY     as a baby   TONSILLECTOMY        Charma Igo, FNP 12/13/21 0944    Charma Igo, FNP 12/13/21 [phone]

## 2021-12-13 NOTE — ED Triage Notes (Signed)
Patient to Urgent Care with complaints of lower back pain, left sided. Reports pain radiates into her LLQ. Pain relieved when sitting, then when standing/ yawning feels pain.   Denies any urinary symptoms, no hx of kidney stones. Has been taking gas-ex/ laxative- pain has continued.   Patient also has a 35 year old and has been lifting him and lifting the car seat of her newborn.   Currently taking keflex for mastitis.

## 2021-12-13 NOTE — Discharge Instructions (Signed)
Follow up with your primary care provider if your symptoms are not improving.     

## 2021-12-31 ENCOUNTER — Ambulatory Visit (INDEPENDENT_AMBULATORY_CARE_PROVIDER_SITE_OTHER): Payer: Medicaid Other | Admitting: Obstetrics and Gynecology

## 2021-12-31 ENCOUNTER — Encounter: Payer: Self-pay | Admitting: Obstetrics and Gynecology

## 2021-12-31 NOTE — Progress Notes (Signed)
Post Partum Visit Note  [name] is a 36 y.o. [id number] female who presents for a postpartum visit. She is 6 weeks postpartum following a normal spontaneous vaginal delivery.  I have fully reviewed the prenatal and intrapartum course. The delivery was at 37.2 gestational weeks.  Anesthesia: none. Postpartum course has been uncomplicated. Baby is doing well. Baby is feeding by breast. Bleeding  started her cycle this week  . Bowel function is normal. Bladder function is normal. Patient is not sexually active. Contraception method is  started [employer]  . Postpartum depression screening: negative.   The pregnancy intention screening data noted above was reviewed. Potential methods of contraception were discussed. The patient elected to proceed with No data recorded.   Edinburgh Postnatal Depression Scale - 12/31/21 1000       Edinburgh Postnatal Depression Scale:  In the Past 7 Days   I have been able to laugh and see the funny side of things. 0    I have looked forward with enjoyment to things. 0    I have blamed myself unnecessarily when things went wrong. 0    I have been anxious or worried for no good reason. 2    I have felt scared or panicky for no good reason. 0    Things have been getting on top of me. 0    I have been so unhappy that I have had difficulty sleeping. 0    I have felt sad or miserable. 0    I have been so unhappy that I have been crying. 0    The thought of harming myself has occurred to me. 0    Edinburgh Postnatal Depression Scale Total 2             Health Maintenance Due  Topic Date Due   COVID-19 Vaccine (1) Never done   HPV VACCINES (3 - 3-dose series) 12/19/2005   INFLUENZA VACCINE  11/05/2021    The following portions of the patient's history were reviewed and updated as appropriate: allergies, current medications, past family history, past medical history, past social history, past surgical history, and problem list.  Review of  Systems Pertinent items are noted in HPI.  Objective:  BP 121/79   Pulse 93   Wt 193 lb (87.5 kg)   LMP 02/20/2021 Comment: recent birth 11-17-21  Breastfeeding Yes   BMI 35.30 kg/m    General:  alert and no distress   Breasts:  not indicated        Abdomen: soft, non-tender; bowel sounds normal; no masses,  no organomegaly   Wound N/a  GU exam:  not indicated       Assessment:    1. Postpartum care and examination 6 wk postpartum exam.   Plan:   Essential components of care per ACOG recommendations:  1.  Mood and well being: Patient with negative depression screening today. Reviewed local resources for support.  - Patient tobacco use? No.   - hx of drug use? No.    2. Infant care and feeding:  -Patient currently breastmilk feeding? Yes. Reviewed importance of draining breast regularly to support lactation.  -Social determinants of health (SDOH) reviewed in EPIC. No concerns  3. Sexuality, contraception and birth spacing - Patient does not want a pregnancy in the next year.  Desired family size is 4 children.  - Reviewed reproductive life planning. Reviewed contraceptive methods based on pt preferences and effectiveness.  Patient desired POP - on [place].   -  Discussed birth spacing of 18 months  4. Sleep and fatigue -Encouraged family/partner/community support of 4 hrs of uninterrupted sleep to help with mood and fatigue  5. Physical Recovery  - Discussed patients delivery and complications. She describes her labor as good. - Patient had a Vaginal, no problems at delivery. Patient had no laceration. Perineal healing reviewed. Patient expressed understanding - Patient has urinary incontinence? No. - Patient is safe to resume physical and sexual activity  6.  Health Maintenance - HM due items addressed Yes - Last pap smear  Diagnosis  Date Value Ref Range Status  05/21/2021   Final   - Negative for intraepithelial lesion or malignancy (NILM)   Pap smear not done  at today's visit.  -Breast Cancer screening indicated? No.   7. Chronic Disease/Pregnancy Condition follow up: None  - PCP follow up  Radene Gunning, [place] [facility], [place]

## 2022-01-08 ENCOUNTER — Telehealth: Payer: Self-pay

## 2022-01-08 NOTE — Telephone Encounter (Signed)
Return call to pt regarding triage message.  Pt delivered 11/17/21 LMP: 12/27/21 pt states after cycle ended she started bleeding again wanted to discuss was this normal Pt made aware with birth control she may have irregular 3-6 months . Pt advised to monitor bleeding if bleeding becomes very heavy and painful to let us know.  Pt agreeable and voiced understanding.

## 2022-02-13 ENCOUNTER — Ambulatory Visit
Admission: EM | Admit: 2022-02-13 | Discharge: 2022-02-13 | Disposition: A | Discharge status: Home / Self Care | Payer: Medicaid Other | Attending: Emergency Medicine | Admitting: Emergency Medicine

## 2022-02-13 DIAGNOSIS — J069 Acute upper respiratory infection, unspecified: Secondary | ICD-10-CM | POA: Diagnosis not present

## 2022-02-13 LAB — POCT RAPID STREP A (OFFICE): Rapid Strep A Screen: NEGATIVE

## 2022-02-13 NOTE — ED Provider Notes (Signed)
[name]    CSN: [id number] Arrival date & time: 02/13/22  0831      History   Chief Complaint Chief Complaint  Patient presents with   Sore Throat    Sore throat, sinus drainage and overall not feeling great - Entered by patient    HPI [name] is a 36 y.o. female.  Patient presents with 1 day history of low-grade fever, sore throat, postnasal drip, body aches.  No rash, cough, shortness of breath, vomiting, diarrhea, or other symptoms.  No OTC medications taken; patient is breastfeeding her 63 month old and is unsure of what she can take.    The history is provided by the patient and medical records.    Past Medical History:  Diagnosis Date   Depression    GERD (gastroesophageal reflux disease)    Hx of percutaneous transcatheter closure of congenital VSD    Migraine headache with aura    Panic attack     Patient Active Problem List   Diagnosis Date Noted   Depression 05/21/2021   S/P VSD repair    Migraine headache with aura    GERD (gastroesophageal reflux disease) 12/01/2006    Past Surgical History:  Procedure Laterality Date   CARDIAC SURGERY     as a baby   TONSILLECTOMY      OB History     Gravida  4   Para  4   Term  4   Preterm      AB      Living  4      SAB      IAB      Ectopic      Multiple  0   Live Births  4            Home Medications    Prior to Admission medications   Medication Sig Start Date End Date Taking? Authorizing Provider  Calcium Carbonate Antacid (TUMS PO) Take 2 tablets by mouth daily as needed (heartburn).    [provider]  ibuprofen (ADVIL) 600 MG tablet Take 1 tablet (600 mg total) by mouth every 6 (six) hours. 11/18/21   Mercado-Ortiz, Lahoma Crocker, DO  Prenatal Vit-Fe Fumarate-FA (PRENATAL PO) Take 2 tablets by mouth daily.    [provider]    Family History Family History  Problem Relation Age of Onset   Healthy Mother    Hypertension Father     Diabetes Father    Asthma Paternal Aunt    Congestive Heart Failure Paternal Uncle    Cancer Paternal Grandmother        breast   Stroke Paternal Grandfather    Asthma Cousin    Congestive Heart Failure Other    Heart disease Neg Hx     Social History Social History   Tobacco Use   Smoking status: Former    Types: Cigarettes    Quit date: 08/16/2010    Years since quitting: 11.5   Smokeless tobacco: Never  Vaping Use   Vaping Use: Never used  Substance Use Topics   Alcohol use: Not Currently    Comment: last drink drink early September   Drug use: No     Allergies   Patient has no known allergies.   Review of Systems Review of Systems  Constitutional:  Positive for fever. Negative for chills.  HENT:  Positive for postnasal drip, rhinorrhea and sore throat. Negative for ear pain.   Respiratory:  Negative for cough and  shortness of breath.   Cardiovascular:  Negative for chest pain and palpitations.  Gastrointestinal:  Negative for diarrhea and vomiting.  Skin:  Negative for rash.  All other systems reviewed and are negative.    Physical Exam Triage Vital Signs ED Triage Vitals  Enc Vitals Group     BP 02/13/22 0903 (!) 148/89     Pulse Rate 02/13/22 0903 (!) 109     Resp 02/13/22 0903 20     Temp 02/13/22 0903 99 F (37.2 C)     Temp src --      SpO2 02/13/22 0903 98 %     Weight 02/13/22 0901 191 lb (86.6 kg)     Height 02/13/22 0901 5\' 2"  (1.575 m)     Head Circumference --      Peak Flow --      Pain Score 02/13/22 0857 7     Pain Loc --      Pain Edu? --      Excl. in GC? --    No data found.  Updated Vital Signs BP (!) 148/89   Pulse (!) 109   Temp 99 F (37.2 C)   Resp 20   Ht 5\' 2"  (1.575 m)   Wt 191 lb (86.6 kg)   SpO2 98%   Breastfeeding Yes   BMI 34.93 kg/m   Visual Acuity Right Eye Distance:   Left Eye Distance:   Bilateral Distance:    Right Eye Near:   Left Eye Near:    Bilateral Near:     Physical Exam Vitals and  nursing note reviewed.  Constitutional:      General: She is not in acute distress.    Appearance: Normal appearance. She is well-developed. She is not ill-appearing.  HENT:     Right Ear: Tympanic membrane normal.     Left Ear: Tympanic membrane normal.     Nose: Rhinorrhea present.     Mouth/Throat:     Mouth: Mucous membranes are moist.     Pharynx: Posterior oropharyngeal erythema present.  Cardiovascular:     Rate and Rhythm: Normal rate and regular rhythm.     Heart sounds: Normal heart sounds.  Pulmonary:     Effort: Pulmonary effort is normal. No respiratory distress.     Breath sounds: Normal breath sounds.  Musculoskeletal:     Cervical back: Neck supple.  Skin:    General: Skin is warm and dry.  Neurological:     Mental Status: She is alert.  Psychiatric:        Mood and Affect: Mood normal.        Behavior: Behavior normal.      UC Treatments / Results  Labs (all labs ordered are listed, but only abnormal results are displayed) Labs Reviewed  POCT RAPID STREP A (OFFICE)    EKG   Radiology No results found.  Procedures Procedures (including critical care time)  Medications Ordered in UC Medications - No data to display  Initial Impression / Assessment and Plan / UC Course  I have reviewed the triage vital signs and the nursing notes.  Pertinent labs & imaging results that were available during my care of the patient were reviewed by me and considered in my medical decision making (see chart for details).    Viral URI.  Rapid strep negative.  Patient declines COVID test.  Discussed symptomatic treatment including Tylenol, nasal saline drops, rest, hydration.  Instructed patient to follow up with PCP if symptoms  are not improving.  She agrees to plan of care.   Final Clinical Impressions(s) / UC Diagnoses   Final diagnoses:  Viral URI     Discharge Instructions      Your strep test is negative.    Take Tylenol as needed for fever or  discomfort.  Follow up with your primary care provider if your symptoms are not improving.        ED Prescriptions   None    PDMP not reviewed this encounter.   Mickie Bail, NP 02/13/22 [phone]

## 2022-02-13 NOTE — Discharge Instructions (Addendum)
Your strep test is negative.    Take Tylenol as needed for fever or discomfort.      Follow-up with your primary care provider if your symptoms are not improving.     

## 2022-02-13 NOTE — ED Triage Notes (Signed)
Patient to Urgent Care with complaints of sore throat, sinus drainage down her throat, and fever x1 day (99.7). Reports generalized body aches.   Patient also breast feeds and is unsure what she is able to take.  Reports hx of sinus infections.

## 2022-03-24 ENCOUNTER — Other Ambulatory Visit: Payer: Self-pay

## 2022-03-24 DIAGNOSIS — Z309 Encounter for contraceptive management, unspecified: Secondary | ICD-10-CM

## 2022-03-24 MED ORDER — SLYND 4 MG PO TABS: 1.0000 | ORAL_TABLET | Freq: Every day | ORAL | 11 refills | Status: AC

## 2022-03-24 NOTE — Progress Notes (Signed)
Pt needs Refill on BCP's Rx sent.

## 2022-04-30 ENCOUNTER — Telehealth: Payer: Medicaid Other | Admitting: Physician Assistant

## 2022-04-30 DIAGNOSIS — J069 Acute upper respiratory infection, unspecified: Secondary | ICD-10-CM | POA: Diagnosis not present

## 2022-04-30 MED ORDER — PROMETHAZINE-DM 6.25-15 MG/5ML PO SYRP: 5.0000 mL | ORAL_SOLUTION | Freq: Four times a day (QID) | ORAL | 0 refills | Status: DC | PRN

## 2022-04-30 MED ORDER — FLUTICASONE PROPIONATE 50 MCG/ACT NA SUSP: 2.0000 | Freq: Every day | NASAL | 0 refills | Status: DC

## 2022-04-30 MED ORDER — PREDNISONE 10 MG (21) PO TBPK: ORAL_TABLET | ORAL | 0 refills | Status: DC

## 2022-04-30 NOTE — Patient Instructions (Signed)
Cleda Mccreedy, thank you for joining Margaretann Loveless, PA-C for today's virtual visit.  While this provider is not your primary care provider (PCP), if your PCP is located in our provider database this encounter information will be shared with them immediately following your visit.   A [facility] MyChart account gives you access to today's visit and all your visits, tests, and labs performed at [facility] " click here if you don't have a [facility] MyChart account or go to [email or web address]  Consent: (Patient) [name] provided verbal consent for this virtual visit at the beginning of the encounter.  Current Medications:  Current Outpatient Medications:    fluticasone (FLONASE) 50 MCG/ACT nasal spray, Place 2 sprays into both nostrils daily., Disp: 16 g, Rfl: 0   predniSONE (STERAPRED UNI-PAK 21 TAB) 10 MG (21) TBPK tablet, 6 day taper; take as directed on package instructions, Disp: 21 tablet, Rfl: 0   promethazine-dextromethorphan (PROMETHAZINE-DM) 6.25-15 MG/5ML syrup, Take 5 mLs by mouth 4 (four) times daily as needed., Disp: 118 mL, Rfl: 0   Calcium Carbonate Antacid (TUMS PO), Take 2 tablets by mouth daily as needed (heartburn)., Disp: , Rfl:    ibuprofen (ADVIL) 600 MG tablet, Take 1 tablet (600 mg total) by mouth every 6 (six) hours., Disp: 30 tablet, Rfl: 0   Prenatal Vit-Fe Fumarate-FA (PRENATAL PO), Take 2 tablets by mouth daily., Disp: , Rfl:    Medications ordered in this encounter:  Meds ordered this encounter  Medications   predniSONE (STERAPRED UNI-PAK 21 TAB) 10 MG (21) TBPK tablet    Sig: 6 day taper; take as directed on package instructions    Dispense:  21 tablet    Refill:  0    Order Specific Question:   Supervising Provider    Answer:   Merrilee Jansky [[id number]]   promethazine-dextromethorphan (PROMETHAZINE-DM) 6.25-15 MG/5ML syrup    Sig: Take 5 mLs by mouth 4 (four) times daily as needed.    Dispense:  118 mL    Refill:  0     Order Specific Question:   Supervising Provider    Answer:   Merrilee Jansky [[id number]]   fluticasone (FLONASE) 50 MCG/ACT nasal spray    Sig: Place 2 sprays into both nostrils daily.    Dispense:  16 g    Refill:  0    Order Specific Question:   Supervising Provider    Answer:   Merrilee Jansky [id number]     *If you need refills on other medications prior to your next appointment, please contact your pharmacy*  Follow-Up: Call back or seek an in-person evaluation if the symptoms worsen or if the condition fails to improve as anticipated.  [facility] [phone]  Other Instructions  Viral Respiratory Infection A respiratory infection is an illness that affects part of the respiratory system, such as the lungs, nose, or throat. A respiratory infection that is caused by a virus is called a viral respiratory infection. Common types of viral respiratory infections include: A cold. The flu (influenza). A respiratory syncytial virus (RSV) infection. What are the causes? This condition is caused by a virus. The virus may spread through contact with droplets or direct contact with infected people or their mucus or secretions. The virus may spread from person to person (is contagious). What are the signs or symptoms? Symptoms of this condition include: A stuffy or runny nose. A sore throat or cough. Shortness of breath or difficulty breathing.  Yellow or green mucus (sputum). Other symptoms may include: A fever. Sweating or chills. Fatigue. Achy muscles. A headache. How is this diagnosed? This condition may be diagnosed based on: Your symptoms. A physical exam. Testing of secretions from the nose or throat. Chest X-ray. How is this treated? This condition may be treated with medicines, such as: Antiviral medicine. This may shorten the length of time a person has symptoms. Expectorants. These make it easier to cough up mucus. Decongestant nasal  sprays. Acetaminophen or NSAIDs, such as ibuprofen, to relieve fever and pain. Antibiotic medicines are not prescribed for viral infections.This is because antibiotics are designed to kill bacteria. They do not kill viruses. Follow these instructions at home: Managing pain and congestion Take over-the-counter and prescription medicines only as told by your health care provider. If you have a sore throat, gargle with a mixture of salt and water 3-4 times a day or as needed. To make salt water, completely dissolve -1 tsp (3-6 g) of salt in 1 cup (237 mL) of warm water. Use nose drops made from salt water to ease congestion and soften raw skin around your nose. Take 2 tsp (10 mL) of honey at bedtime to lessen coughing at night. Do not give honey to children who are younger than 1 year. Drink enough fluid to keep your urine pale yellow. This helps prevent dehydration and helps loosen up mucus. General instructions  Rest as much as possible. Do not drink alcohol. Do not use any products that contain nicotine or tobacco. These products include cigarettes, chewing tobacco, and vaping devices, such as e-cigarettes. If you need help quitting, ask your health care provider. Keep all follow-up visits. This is important. How is this prevented?     Get an annual flu shot. You may get the flu shot in late summer, fall, or winter. Ask your health care provider when you should get your flu shot. Avoid spreading your infection to other people. If you are sick: Wash your hands with soap and water often, especially after you cough or sneeze. Wash for at least 20 seconds. If soap and water are not available, use alcohol-based hand sanitizer. Cover your mouth when you cough. Cover your nose and mouth when you sneeze. Do not share cups or eating utensils. Clean commonly used objects often. Clean commonly touched surfaces. Stay home from work or school as told by your health care provider. Avoid contact with  people who are sick during cold and flu season. This is generally fall and winter. Contact a health care provider if: Your symptoms last for 10 days or longer. Your symptoms get worse over time. You have severe sinus pain in your face or forehead. The glands in your jaw or neck become very swollen. You have shortness of breath. Get help right away if you: Feel pain or pressure in your chest. Have trouble breathing. Faint or feel like you will faint. Have severe and persistent vomiting. Feel confused or disoriented. These symptoms may represent a serious problem that is an emergency. Do not wait to see if the symptoms will go away. Get medical help right away. Call your local emergency services (911 in the [place] Do not drive yourself to the hospital. Summary A respiratory infection is an illness that affects part of the respiratory system, such as the lungs, nose, or throat. A respiratory infection that is caused by a virus is called a viral respiratory infection. Common types of viral respiratory infections include a cold, influenza, and respiratory  syncytial virus (RSV) infection. Symptoms of this condition include a stuffy or runny nose, cough, fatigue, achy muscles, sore throat, and fevers or chills. Antibiotic medicines are not prescribed for viral infections. This is because antibiotics are designed to kill bacteria. They are not effective against viruses. This information is not intended to replace advice given to you by your health care provider. Make sure you discuss any questions you have with your health care provider. Document Revised: 06/28/2020 Document Reviewed: 06/28/2020 Elsevier Patient Education  [place].    If you have been instructed to have an in-person evaluation today at a local Urgent Care facility, please use the link below. It will take you to a list of all of our available [facility], including address, phone number and hours of operation.  Please do not delay care.  [facility]  If you or a family member do not have a primary care provider, use the link below to schedule a visit and establish care. When you choose a [facility] primary care physician or advanced practice provider, you gain a long-term partner in health. Find a Primary Care Provider  Learn more about [facility] in-office and virtual care options: [facility]

## 2022-04-30 NOTE — Progress Notes (Signed)
Virtual Visit Consent   [name], you are scheduled for a virtual visit with a [facility] provider today. Just as with appointments in the office, your consent must be obtained to participate. Your consent will be active for this visit and any virtual visit you may have with one of our providers in the next 365 days. If you have a MyChart account, a copy of this consent can be sent to you electronically.  As this is a virtual visit, video technology does not allow for your provider to perform a traditional examination. This may limit your provider's ability to fully assess your condition. If your provider identifies any concerns that need to be evaluated in person or the need to arrange testing (such as labs, EKG, etc.), we will make arrangements to do so. Although advances in technology are sophisticated, we cannot ensure that it will always work on either your end or our end. If the connection with a video visit is poor, the visit may have to be switched to a telephone visit. With either a video or telephone visit, we are not always able to ensure that we have a secure connection.  By engaging in this virtual visit, you consent to the provision of healthcare and authorize for your insurance to be billed (if applicable) for the services provided during this visit. Depending on your insurance coverage, you may receive a charge related to this service.  I need to obtain your verbal consent now. Are you willing to proceed with your visit today? [name] has provided verbal consent on 04/30/2022 for a virtual visit (video or telephone). Mar Daring, PA-C  Date: 04/30/2022 9:09 AM  Virtual Visit via Video Note   I, Mar Daring, connected with  [name]  ([id number], January 01, 1986) on 04/30/22 at  9:00 AM EST by a video-enabled telemedicine application and verified that I am speaking with the correct person using two identifiers.  Location: Patient: Virtual Visit  Location Patient: Home Provider: Virtual Visit Location Provider: Home Office   I discussed the limitations of evaluation and management by telemedicine and the availability of in person appointments. The patient expressed understanding and agreed to proceed.    History of Present Illness: [name] is a 37 y.o. who identifies as a female who was assigned female at birth, and is being seen today for URI symptoms.  HPI: URI  This is a new problem. The current episode started in the past 7 days (2-3 days). The problem has been gradually worsening. The maximum temperature recorded prior to her arrival was 101 - 101.9 F (101). The fever has been present for Less than 1 day. Associated symptoms include chest pain (tightness with coughing), congestion, coughing, headaches, rhinorrhea, sinus pain and a sore throat. Pertinent negatives include no ear pain, plugged ear sensation or wheezing. Treatments tried: Dayquil, nyquil. The treatment provided no relief.     Problems:  Patient Active Problem List   Diagnosis Date Noted   Depression 05/21/2021   S/P VSD repair    Migraine headache with aura    GERD (gastroesophageal reflux disease) 12/01/2006    Allergies: No Known Allergies Medications:  Current Outpatient Medications:    fluticasone (FLONASE) 50 MCG/ACT nasal spray, Place 2 sprays into both nostrils daily., Disp: 16 g, Rfl: 0   predniSONE (STERAPRED UNI-PAK 21 TAB) 10 MG (21) TBPK tablet, 6 day taper; take as directed on package instructions, Disp: 21 tablet, Rfl: 0   promethazine-dextromethorphan (PROMETHAZINE-DM)  6.25-15 MG/5ML syrup, Take 5 mLs by mouth 4 (four) times daily as needed., Disp: 118 mL, Rfl: 0   Calcium Carbonate Antacid (TUMS PO), Take 2 tablets by mouth daily as needed (heartburn)., Disp: , Rfl:    ibuprofen (ADVIL) 600 MG tablet, Take 1 tablet (600 mg total) by mouth every 6 (six) hours., Disp: 30 tablet, Rfl: 0   Prenatal Vit-Fe Fumarate-FA (PRENATAL PO), Take 2  tablets by mouth daily., Disp: , Rfl:   Observations/Objective: Patient is well-developed, well-nourished in no acute distress.  Resting comfortably at home.  Head is normocephalic, atraumatic.  No labored breathing.  Speech is clear and coherent with logical content.  Patient is alert and oriented at baseline.    Assessment and Plan: 1. Viral URI with cough - predniSONE (STERAPRED UNI-PAK 21 TAB) 10 MG (21) TBPK tablet; 6 day taper; take as directed on package instructions  Dispense: 21 tablet; Refill: 0 - promethazine-dextromethorphan (PROMETHAZINE-DM) 6.25-15 MG/5ML syrup; Take 5 mLs by mouth 4 (four) times daily as needed.  Dispense: 118 mL; Refill: 0 - fluticasone (FLONASE) 50 MCG/ACT nasal spray; Place 2 sprays into both nostrils daily.  Dispense: 16 g; Refill: 0  - Suspect viral URI - Prednisone added for chest congestion and cough - Fluticasone for nasal congestion - Promethazine DM for cough - Symptomatic medications of choice over the counter as needed - Push fluids - Rest - Seek further evaluation if symptoms change or worsen   Follow Up Instructions: I discussed the assessment and treatment plan with the patient. The patient was provided an opportunity to ask questions and all were answered. The patient agreed with the plan and demonstrated an understanding of the instructions.  A copy of instructions were sent to the patient via MyChart unless otherwise noted below.    The patient was advised to call back or seek an in-person evaluation if the symptoms worsen or if the condition fails to improve as anticipated.  Time:  I spent 11 minutes with the patient via telehealth technology discussing the above problems/concerns.    Mar Daring, PA-C

## 2022-05-07 ENCOUNTER — Telehealth: Payer: Medicaid Other | Admitting: Physician Assistant

## 2022-05-07 DIAGNOSIS — J019 Acute sinusitis, unspecified: Secondary | ICD-10-CM | POA: Diagnosis not present

## 2022-05-07 DIAGNOSIS — B9689 Other specified bacterial agents as the cause of diseases classified elsewhere: Secondary | ICD-10-CM

## 2022-05-07 DIAGNOSIS — B9789 Other viral agents as the cause of diseases classified elsewhere: Secondary | ICD-10-CM

## 2022-05-07 MED ORDER — AMOXICILLIN-POT CLAVULANATE 875-125 MG PO TABS: 1.0000 | ORAL_TABLET | Freq: Two times a day (BID) | ORAL | 0 refills | Status: DC

## 2022-05-07 NOTE — Progress Notes (Signed)
I have spent 5 minutes in review of e-visit questionnaire, review and updating patient chart, medical decision making and response to patient.   Romulus Hanrahan Cody Dustin Burrill, PA-C    

## 2022-05-07 NOTE — Progress Notes (Signed)
Message sent to patient requesting further input regarding current symptoms. Awaiting patient response.  

## 2022-05-07 NOTE — Progress Notes (Signed)
I have spent 5 minutes in review of e-visit questionnaire, review and updating patient chart, medical decision making and response to patient.   Lonie Newsham Cody Shatona Andujar, PA-C    

## 2022-05-07 NOTE — Progress Notes (Signed)
E-Visit for Sinus Problems  We are sorry that you are not feeling well.  Here is how we plan to help!  Based on what you have shared with me it looks like you have sinusitis.  Sinusitis is inflammation and infection in the sinus cavities of the head.  Based on your presentation I believe you most likely have Acute Viral Sinusitis.This is an infection most likely caused by a virus. There is not specific treatment for viral sinusitis other than to help you with the symptoms until the infection runs its course.  You may use an oral decongestant such as Mucinex D or if you have glaucoma or high blood pressure use plain Mucinex. Saline nasal spray help and can safely be used as often as needed for congestion.  Keep up with use oft he Flonase nasal spray. If this is not clearing up over the weekend or you note any new sinus pain or tooth pain, please let us know.   Some authorities believe that zinc sprays or the use of Echinacea may shorten the course of your symptoms.  Sinus infections are not as easily transmitted as other respiratory infection, however we still recommend that you avoid close contact with loved ones, especially the very young and elderly.  Remember to wash your hands thoroughly throughout the day as this is the number one way to prevent the spread of infection!  Home Care: Only take medications as instructed by your medical team. Do not take these medications with alcohol. A steam or ultrasonic humidifier can help congestion.  You can place a towel over your head and breathe in the steam from hot water coming from a faucet. Avoid close contacts especially the very young and the elderly. Cover your mouth when you cough or sneeze. Always remember to wash your hands.  Get Help Right Away If: You develop worsening fever or sinus pain. You develop a severe head ache or visual changes. Your symptoms persist after you have completed your treatment plan.  Make sure you Understand  these instructions. Will watch your condition. Will get help right away if you are not doing well or get worse.   Thank you for choosing an e-visit.  Your e-visit answers were reviewed by a board certified advanced clinical practitioner to complete your personal care plan. Depending upon the condition, your plan could have included both over the counter or prescription medications.  Please review your pharmacy choice. Make sure the pharmacy is open so you can pick up prescription now. If there is a problem, you may contact your provider through [employer] and have the prescription routed to another pharmacy.  Your safety is important to [place]. If you have drug allergies check your prescription carefully.   For the next 24 hours you can use MyChart to ask questions about today's visit, request a non-urgent call back, or ask for a work or school excuse. You will get an email in the next two days asking about your experience. I hope that your e-visit has been valuable and will speed your recovery.

## 2022-05-07 NOTE — Progress Notes (Signed)

## 2022-09-04 ENCOUNTER — Ambulatory Visit (INDEPENDENT_AMBULATORY_CARE_PROVIDER_SITE_OTHER): Payer: Medicaid Other | Admitting: Nurse Practitioner

## 2022-09-04 ENCOUNTER — Encounter: Payer: Self-pay | Admitting: Nurse Practitioner

## 2022-09-04 ENCOUNTER — Ambulatory Visit: Payer: Self-pay | Admitting: Physician Assistant

## 2022-09-04 VITALS — BP 118/70 | HR 84 | Temp 98.0°F | Resp 16 | Ht 62.0 in | Wt 199.0 lb

## 2022-09-04 DIAGNOSIS — Z131 Encounter for screening for diabetes mellitus: Secondary | ICD-10-CM

## 2022-09-04 DIAGNOSIS — F325 Major depressive disorder, single episode, in full remission: Secondary | ICD-10-CM | POA: Diagnosis not present

## 2022-09-04 DIAGNOSIS — K219 Gastro-esophageal reflux disease without esophagitis: Secondary | ICD-10-CM | POA: Diagnosis not present

## 2022-09-04 DIAGNOSIS — Z6835 Body mass index (BMI) 35.0-35.9, adult: Secondary | ICD-10-CM

## 2022-09-04 DIAGNOSIS — E669 Obesity, unspecified: Secondary | ICD-10-CM

## 2022-09-04 DIAGNOSIS — G43109 Migraine with aura, not intractable, without status migrainosus: Secondary | ICD-10-CM | POA: Diagnosis not present

## 2022-09-04 DIAGNOSIS — E66812 Obesity, class 2: Secondary | ICD-10-CM

## 2022-09-04 LAB — CBC WITH DIFFERENTIAL/PLATELET
Basophils Absolute: 0 10*3/uL (ref 0.0–0.1)
Basophils Relative: 0.6 % (ref 0.0–3.0)
Eosinophils Absolute: 0.1 10*3/uL (ref 0.0–0.7)
Eosinophils Relative: 1.9 % (ref 0.0–5.0)
HCT: 39.9 % (ref 36.0–46.0)
Hemoglobin: 13.1 g/dL (ref 12.0–15.0)
Lymphocytes Relative: 25.1 % (ref 12.0–46.0)
Lymphs Abs: 1.9 10*3/uL (ref 0.7–4.0)
MCHC: 32.8 g/dL (ref 30.0–36.0)
MCV: 82.4 fl (ref 78.0–100.0)
Monocytes Absolute: 0.5 10*3/uL (ref 0.1–1.0)
Monocytes Relative: 7 % (ref 3.0–12.0)
Neutro Abs: 4.9 10*3/uL (ref 1.4–7.7)
Neutrophils Relative %: 65.4 % (ref 43.0–77.0)
Platelets: 300 10*3/uL (ref 150.0–400.0)
RBC: 4.84 Mil/uL (ref 3.87–5.11)
RDW: 14.1 % (ref 11.5–15.5)
WBC: 7.5 10*3/uL (ref 4.0–10.5)

## 2022-09-04 LAB — COMPREHENSIVE METABOLIC PANEL
ALT: 6 U/L (ref 0–35)
AST: 12 U/L (ref 0–37)
Albumin: 4.4 g/dL (ref 3.5–5.2)
Alkaline Phosphatase: 55 U/L (ref 39–117)
BUN: 14 mg/dL (ref 6–23)
CO2: 22 mEq/L (ref 19–32)
Calcium: 9.3 mg/dL (ref 8.4–10.5)
Chloride: 107 mEq/L (ref 96–112)
Creatinine, Ser: 0.63 mg/dL (ref 0.40–1.20)
GFR: 114.85 mL/min (ref >60.00)
Glucose, Bld: 103 mg/dL — ABNORMAL HIGH (ref 70–99)
Potassium: 4.5 mEq/L (ref 3.5–5.1)
Sodium: 138 mEq/L (ref 135–145)
Total Bilirubin: 0.3 mg/dL (ref 0.2–1.2)
Total Protein: 7.5 g/dL (ref 6.0–8.3)

## 2022-09-04 LAB — LIPID PANEL
Cholesterol: 162 mg/dL (ref 0–200)
HDL: 33.1 mg/dL — ABNORMAL LOW (ref >39.00)
LDL Cholesterol: 111 mg/dL — ABNORMAL HIGH (ref 0–99)
NonHDL: 129.22
Total CHOL/HDL Ratio: 5
Triglycerides: 91 mg/dL (ref 0.0–149.0)
VLDL: 18.2 mg/dL (ref 0.0–40.0)

## 2022-09-04 LAB — TSH: TSH: 1.58 u[IU]/mL (ref 0.35–5.50)

## 2022-09-04 LAB — HEMOGLOBIN A1C: Hgb A1c MFr Bld: 5.6 % (ref 4.6–6.5)

## 2022-09-04 NOTE — Patient Instructions (Signed)
Labs ordered.

## 2022-09-04 NOTE — Progress Notes (Signed)
New Patient Office Visit  Subjective    Patient ID: [name], female    DOB: 04-26-1985  Age: 37 y.o. MRN: [id number]  CC:  Chief Complaint  Patient presents with   Establish Care    HPI [name] presents to establish care. She has 4 kids and lives with the fiancee.  She has history of migraine, GERD, and generalized anxiety.  She has migraine 2 or 3 times in a year.  She has visual aura.  The symptoms are alleviated by NSAIDs and sleeping.  She would like to get started on weight loss medication.  She has been trying diet and exercise without significant improvement.  She denies any self or family history of MTC  Health Maintenance  Topic Date Due   HPV VACCINES (3 - 3-dose series) 12/19/2005   COVID-19 Vaccine (1) 09/20/2022 (Originally 08/26/1987)   INFLUENZA VACCINE  11/06/2022   PAP SMEAR-Modifier  05/21/2024   DTaP/Tdap/Td (4 - Td or Tdap) 10/02/2031   Hepatitis C Screening  Completed   HIV Screening  Completed       Topic Date Due   HPV VACCINES (3 - 3-dose series) 12/19/2005    Outpatient Encounter Medications as of 09/04/2022  Medication Sig   fluticasone (FLONASE) 50 MCG/ACT nasal spray Place 2 sprays into both nostrils daily.   SLYND 4 MG TABS Take 1 tablet by mouth daily.   [DISCONTINUED] amoxicillin-clavulanate (AUGMENTIN) 875-125 MG tablet Take 1 tablet by mouth 2 (two) times daily.   [DISCONTINUED] Calcium Carbonate Antacid (TUMS PO) Take 2 tablets by mouth daily as needed (heartburn).   [DISCONTINUED] ibuprofen (ADVIL) 600 MG tablet Take 1 tablet (600 mg total) by mouth every 6 (six) hours.   [DISCONTINUED] Prenatal Vit-Fe Fumarate-FA (PRENATAL PO) Take 2 tablets by mouth daily.   [DISCONTINUED] promethazine-dextromethorphan (PROMETHAZINE-DM) 6.25-15 MG/5ML syrup Take 5 mLs by mouth 4 (four) times daily as needed.   No facility-administered encounter medications on file as of 09/04/2022.    Past Medical History:  Diagnosis Date    Depression    GERD (gastroesophageal reflux disease)    Hx of percutaneous transcatheter closure of congenital VSD    Migraine headache with aura    Panic attack     Past Surgical History:  Procedure Laterality Date   CARDIAC SURGERY     as a baby   TONSILLECTOMY      Family History  Problem Relation Age of Onset   Healthy Mother    Hypertension Father    Diabetes Father    Cancer Paternal Grandmother        breast   Stroke Paternal Grandfather    Asthma Paternal Aunt    Cancer Paternal Uncle        colon cancer   Congestive Heart Failure Paternal Uncle    Asthma Cousin    Congestive Heart Failure Other    Heart disease Neg Hx     Social History   Socioeconomic History   Marital status: Single    Spouse name: Not on file   Number of children: Not on file   Years of education: Not on file   Highest education level: Not on file  Occupational History   Not on file  Tobacco Use   Smoking status: Former    Types: Cigarettes    Quit date: 08/16/2010    Years since quitting: 12.0   Smokeless tobacco: Never  Vaping Use   Vaping Use: Never used  Substance and Sexual  Activity   Alcohol use: Not Currently    Comment: last drink drink early September   Drug use: No   Sexual activity: Yes    Partners: Male    Birth control/protection: None  Other Topics Concern   Not on file  Social History Narrative   Not on file   Social Determinants of Health   Financial Resource Strain: Not on file  Food Insecurity: No Food Insecurity (09/25/2020)   Hunger Vital Sign    Worried About Running Out of Food in the Last Year: Never true    Ran Out of Food in the Last Year: Never true  Transportation Needs: No Transportation Needs (09/25/2020)   PRAPARE - Administrator, Civil Service (Medical): No    Lack of Transportation (Non-Medical): No  Physical Activity: Not on file  Stress: Not on file  Social Connections: Not on file  Intimate Partner Violence: Not on file     Review of Systems  Constitutional: Negative.   HENT: Negative.    Eyes: Negative.   Respiratory: Negative.    Cardiovascular: Negative.   Gastrointestinal: Negative.   Genitourinary: Negative.   Musculoskeletal: Negative.   Skin: Negative.   Neurological: Negative.   Psychiatric/Behavioral: Negative.          Objective    BP 118/70   Pulse 84   Temp 98 F (36.7 C)   Resp 16   Ht 5\' 2"  (1.575 m)   Wt 199 lb (90.3 kg)   SpO2 99%   BMI 36.40 kg/m   Physical Exam Constitutional:      Appearance: Normal appearance. She is normal weight.  HENT:     Head: Normocephalic.     Right Ear: Tympanic membrane normal.     Left Ear: Tympanic membrane normal.     Mouth/Throat:     Mouth: Mucous membranes are moist.  Eyes:     Extraocular Movements: Extraocular movements intact.     Conjunctiva/sclera: Conjunctivae normal.     Pupils: Pupils are equal, round, and reactive to light.  Neck:     Thyroid: No thyroid mass or thyroid tenderness.  Cardiovascular:     Rate and Rhythm: Normal rate and regular rhythm.     Pulses: Normal pulses.     Heart sounds: Normal heart sounds. No murmur heard. Pulmonary:     Effort: Pulmonary effort is normal.     Breath sounds: Normal breath sounds.  Abdominal:     General: Bowel sounds are normal.     Palpations: Abdomen is soft. There is no mass.     Tenderness: There is no abdominal tenderness. There is no rebound.  Musculoskeletal:        General: No swelling.     Cervical back: Neck supple. No tenderness.     Right lower leg: No edema.     Left lower leg: No edema.  Skin:    Findings: No bruising, erythema or rash.  Neurological:     General: No focal deficit present.     Mental Status: She is alert and oriented to person, place, and time. Mental status is at baseline.  Psychiatric:        Mood and Affect: Mood normal.        Behavior: Behavior normal.        Thought Content: Thought content normal.        Judgment:  Judgment normal.         Assessment & Plan:  Screening for  diabetes mellitus (DM) -     Hemoglobin A1c  Migraine with aura and without status migrainosus, not intractable Assessment & Plan: Stable at present. Well-controlled with NSAIDs.  Orders: -     CBC with Differential/Platelet -     Comprehensive metabolic panel -     Lipid panel -     TSH  Gastroesophageal reflux disease, unspecified whether esophagitis present Assessment & Plan: Stable at present.    Major depressive disorder with single episode, in full remission [facility]) Assessment & Plan: Stable without medication. Labs ordered.  Orders: -     CBC with Differential/Platelet -     Comprehensive metabolic panel -     Lipid panel -     TSH  Class 2 obesity without serious comorbidity with body mass index (BMI) of 35.0 to 35.9 in adult, unspecified obesity type Assessment & Plan: Body mass index is 36.4 kg/m. Advised pt to lose weight. Advised patient to avoid trans fat, fatty and fried food. Follow a regular physical activity schedule. Went over the risk of chronic diseases with increased weight.   If Lab results within normal limit will start on weight loss medication       No follow-ups on file.   Kara Dies, NP

## 2022-09-08 ENCOUNTER — Encounter: Payer: Self-pay | Admitting: Nurse Practitioner

## 2022-09-09 ENCOUNTER — Other Ambulatory Visit: Payer: Self-pay | Admitting: Nurse Practitioner

## 2022-09-09 MED ORDER — SEMAGLUTIDE-WEIGHT MANAGEMENT 0.25 MG/0.5ML ~~LOC~~ SOAJ: 0.2500 mg | SUBCUTANEOUS | 4 refills | Status: DC

## 2022-09-14 DIAGNOSIS — E669 Obesity, unspecified: Secondary | ICD-10-CM | POA: Insufficient documentation

## 2022-09-14 DIAGNOSIS — E66811 Obesity, class 1: Secondary | ICD-10-CM | POA: Insufficient documentation

## 2022-09-14 NOTE — Assessment & Plan Note (Addendum)
Body mass index is 36.4 kg/m. Advised pt to lose weight. Advised patient to avoid trans fat, fatty and fried food. Follow a regular physical activity schedule. Went over the risk of chronic diseases with increased weight.   If Lab results within normal limit will start on weight loss medication

## 2022-09-14 NOTE — Assessment & Plan Note (Signed)
Stable at present.   

## 2022-09-14 NOTE — Assessment & Plan Note (Signed)
Stable without medication. Labs ordered.

## 2022-09-14 NOTE — Assessment & Plan Note (Signed)
Stable at present. Well-controlled with NSAIDs.

## 2022-09-15 IMAGING — US US MFM OB FOLLOW-UP
1 series · 14 of 28 positions shown · non-contrast
Comparison: none

[Series 1: us mfm ob follow-up · 38 acquisitions, 14 frames shown]
[im 2/38]
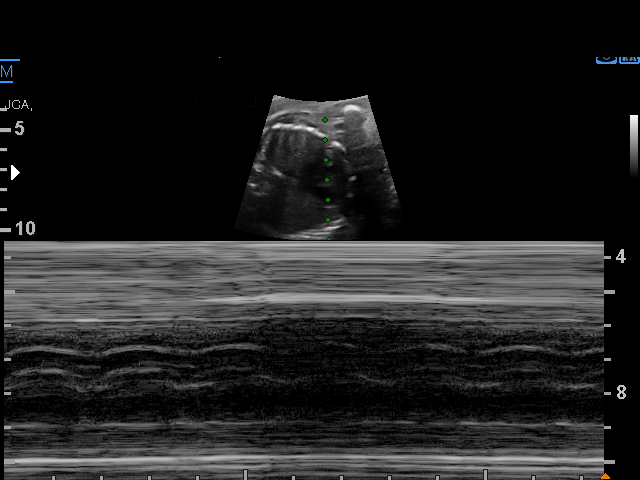
[im 5/38]
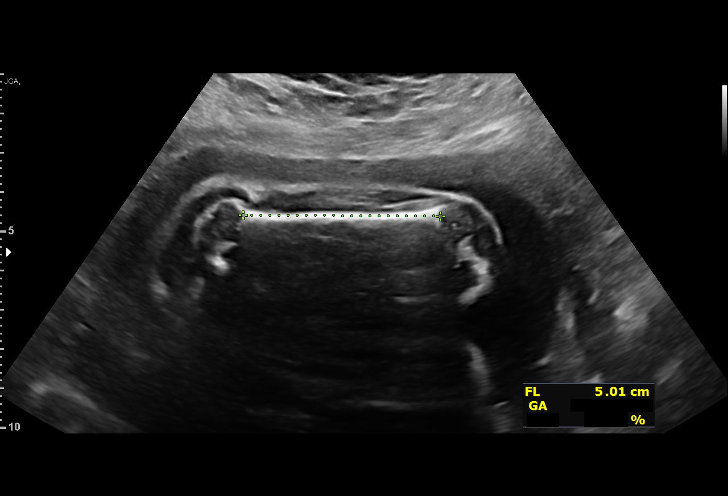
[im 7/38]
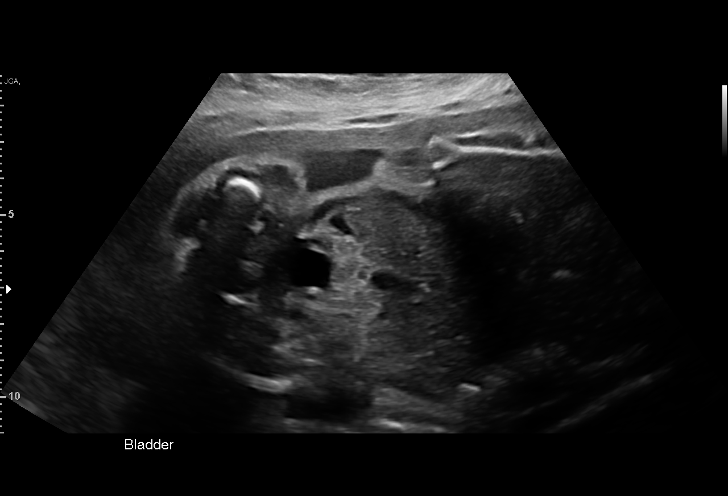
[im 10/38]
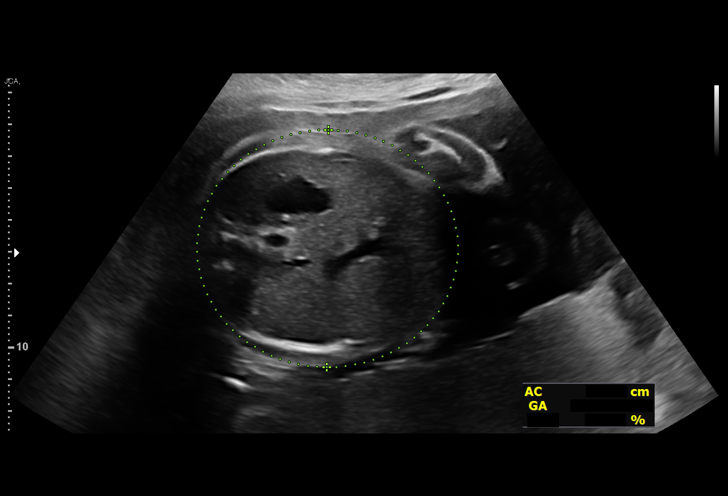
[im 13/38]
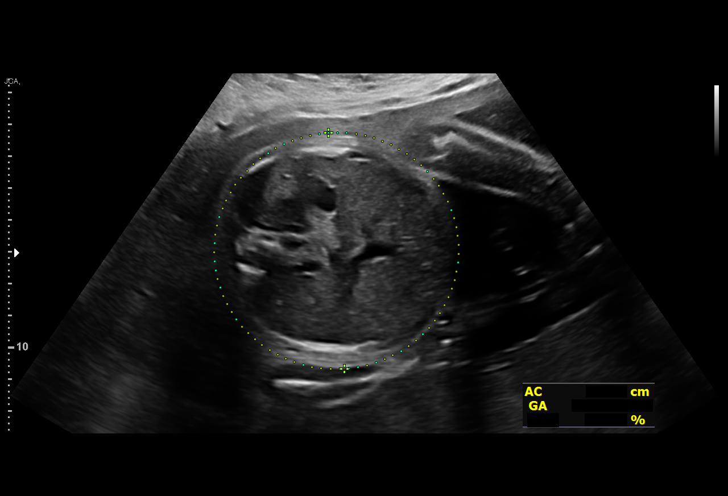
[im 16/38]
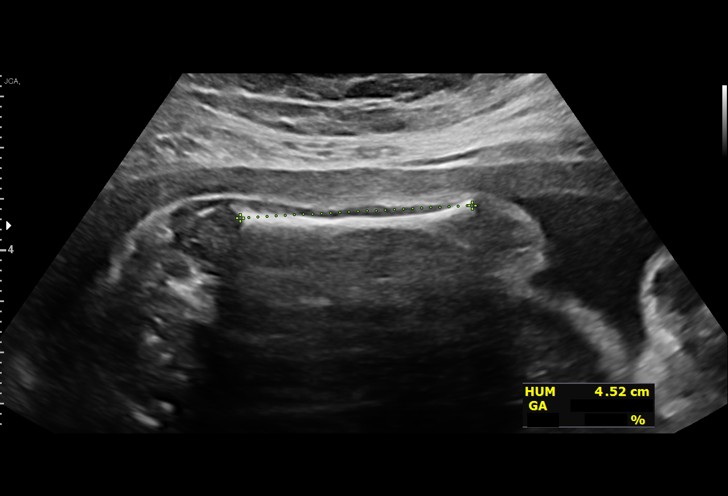
[im 18/38]
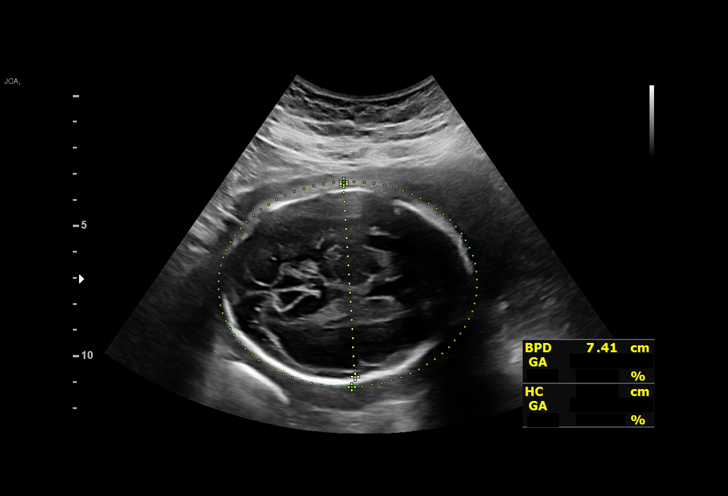
[im 21/38]
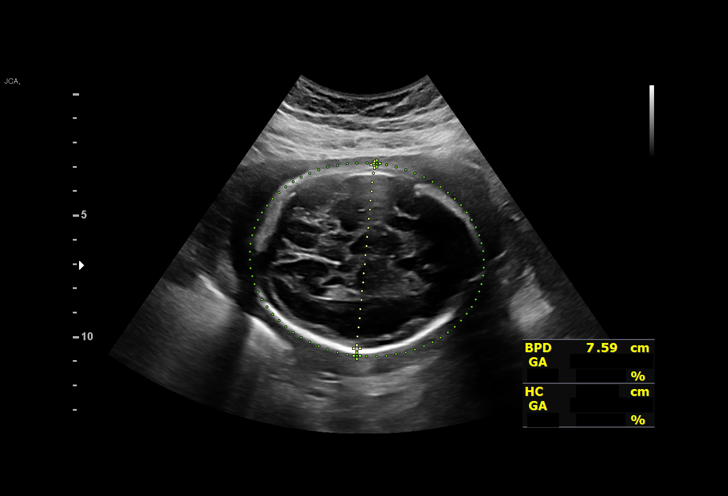
[im 24/38]
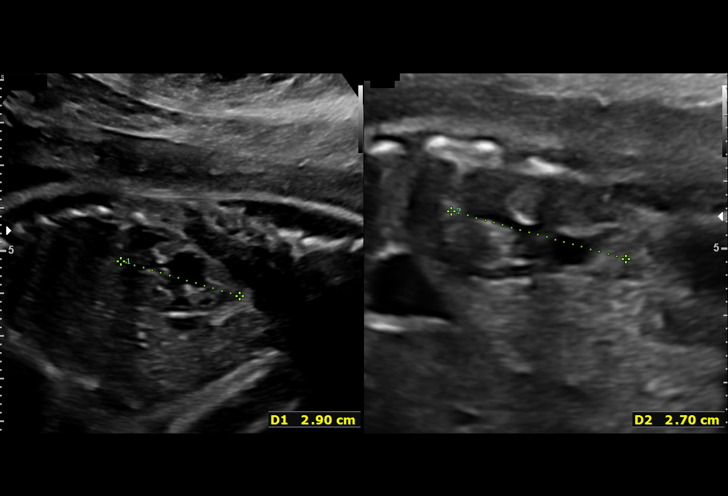
[im 27/38]
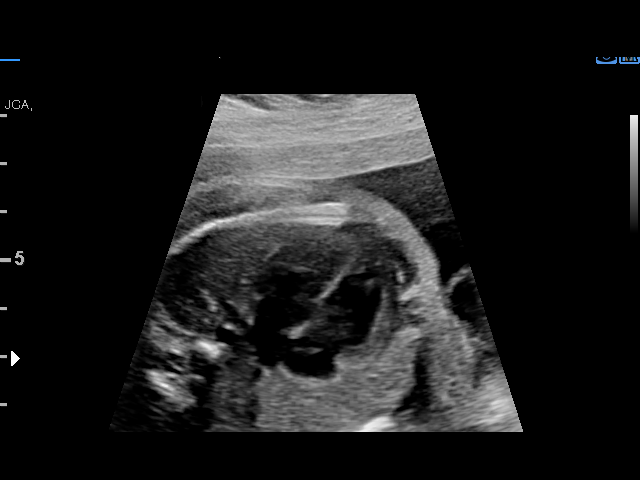
[im 29/38]
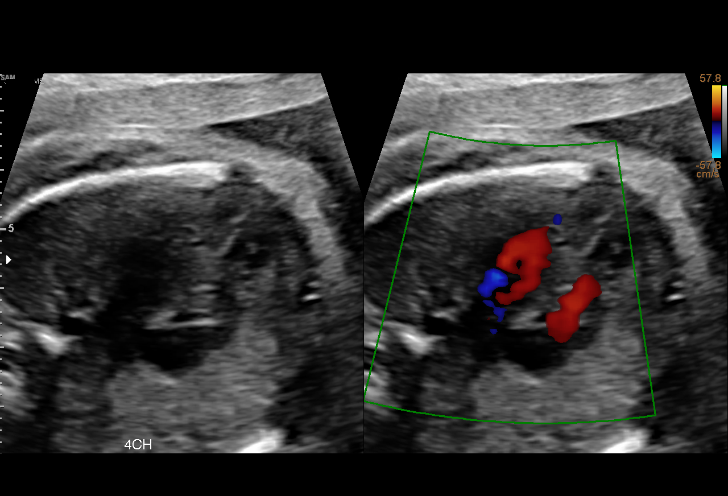
[im 32/38]
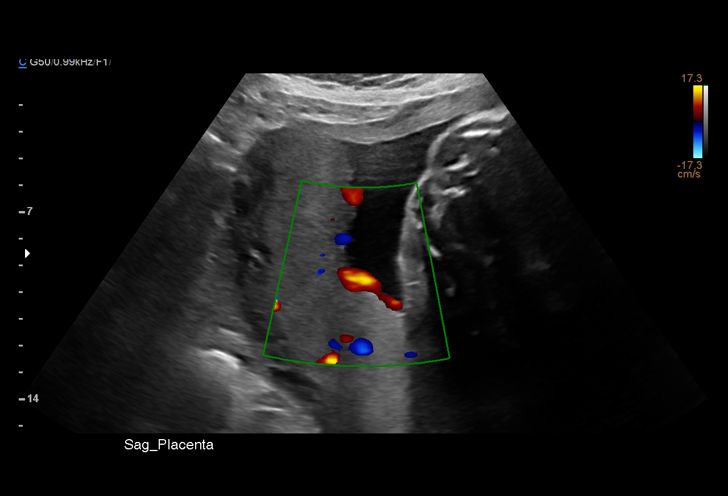
[im 35/38]
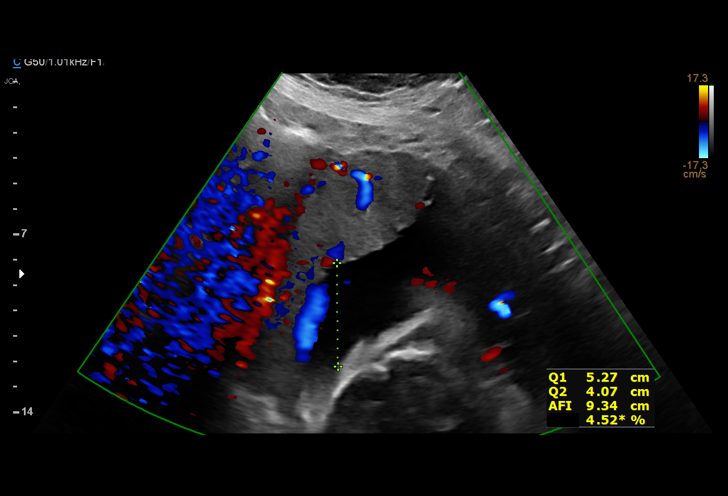
[im 38/38]
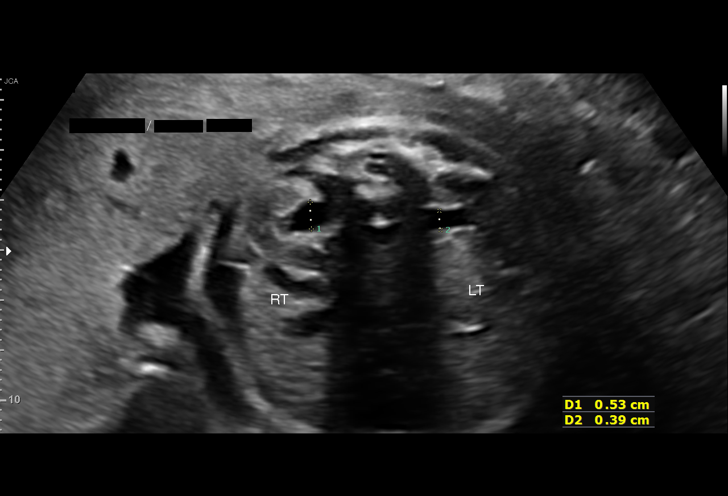

[14 of 28 positions shown; findings below may reference images not displayed]

Indications

 Obesity complicating pregnancy, second
 trimester (Pregravid BMI 36)
 History of congenital or genetic condition
 (Maternal VSD repair)
 Velamentous insertion of umbilical cord
 Negative AFP(Low Risk NIPS)
 Genetic carrier (Intermediate Fragile X
 Syndrome)
 Encounter for other antenatal screening
 follow-up
 Pyelectasis of fetus on prenatal ultrasound
 27 weeks gestation of pregnancy
Fetal Evaluation

 Num Of Fetuses:         1
 Fetal Heart Rate(bpm):  155
 Cardiac Activity:       Observed
 Presentation:           Cephalic
 Placenta:               Posterior
 P. Cord Insertion:      Visualized

 Amniotic Fluid
 AFI FV:      Within normal limits

 AFI Sum(cm)     %Tile       Largest Pocket(cm)
 13.17           37
 RUQ(cm)       RLQ(cm)       LUQ(cm)        LLQ(cm)

Biometry

 BPD:      75.3  mm     G. Age:  30w 1d       > 99  %    CI:        73.58   %    70 - 86
                                                         FL/HC:      18.0   %    18.6 -
 HC:      278.9  mm     G. Age:  30w 4d         98  %    HC/AC:      1.19        1.05 -
 AC:      235.1  mm     G. Age:  27w 6d         64  %    FL/BPD:     66.5   %    71 - 87
 FL:       50.1  mm     G. Age:  27w 0d         29  %    FL/AC:      21.3   %    20 - 24
 HUM:      45.4  mm     G. Age:  26w 6d         40  %
 LV:        3.7  mm

 Est. FW:    5561  gm      2 lb 8 oz     70  %
OB History

 Gravidity:    3         Term:   2        Prem:   0        SAB:   0
 TOP:          0       Ectopic:  0        Living: 2
Gestational Age

 LMP:           27w 1d        Date:  12/28/19                 EDD:   10/03/20
 U/S Today:     28w 6d                                        EDD:   09/21/20
 Best:          27w 1d     Det. By:  LMP  (12/28/19)          EDD:   10/03/20
Anatomy

 Cranium:               Appears normal         Aortic Arch:            Previously seen
 Cavum:                 Appears normal         Ductal Arch:            Previously seen
 Ventricles:            Appears normal         Diaphragm:              Previously seen
 Choroid Plexus:        Previously seen        Stomach:                Appears normal, left
                                                                       sided
 Cerebellum:            Previously seen        Abdomen:                Appears normal
 Posterior Fossa:       Previously seen        Abdominal Wall:         Previously seen
 Nuchal Fold:           Previously seen        Cord Vessels:           Previously seen
 Face:                  Orbits and profile     Kidneys:                Right UTD 5 mm
                        previously seen
 Lips:                  Previously seen        Bladder:                Appears normal
 Thoracic:              Appears normal         Spine:                  Previously seen
 Heart:                 Appears normal         Upper Extremities:      Previously seen
                        (4CH, axis, and
                        situs)
 RVOT:                  Previously seen        Lower Extremities:      Previously seen
 LVOT:                  Previously seen

 Other:  Male gender previously seen. Technically difficult due to maternal
         habitus and fetal position.
Impression

 gestational diabetes.
 Fetal growth is appropriate for gestational age.  Amniotic fluid
 is normal good fetal activity seen.  Right urinary tract dilation
 measuring 5 mm is seen.  Left kidney appears normal.
 I reassured the patient of normal fetal growth.
Recommendations

 -An appointment was made for her to return in 4 weeks for
 fetal growth assessment.
                 Bittner, Ivania

## 2022-10-27 ENCOUNTER — Telehealth: Payer: Self-pay | Admitting: Nurse Practitioner

## 2022-10-27 ENCOUNTER — Encounter: Payer: Self-pay | Admitting: Nurse Practitioner

## 2022-10-27 NOTE — Telephone Encounter (Signed)
NOTED

## 2022-10-27 NOTE — Telephone Encounter (Signed)
Prescription Request  10/27/2022  LOV: 09/04/2022  What is the name of the medication or equipment? Reginal Lutes is on back order at patient's pharmacy  Have you contacted your pharmacy to request a refill? Yes   Which pharmacy would you like this sent to?  [facility], [place] [facility]- Patient is going to call pharmacy's and let us know which one.   Patient notified that their request is being sent to the clinical staff for review and that they should receive a response within 2 business days.   Please advise at Mobile [phone] (mobile)

## 2022-10-28 NOTE — Telephone Encounter (Signed)
[facility] scheduled with Mallie Snooks

## 2022-10-30 ENCOUNTER — Encounter: Payer: Self-pay | Admitting: Nurse Practitioner

## 2022-10-30 ENCOUNTER — Telehealth (INDEPENDENT_AMBULATORY_CARE_PROVIDER_SITE_OTHER): Payer: MEDICAID | Admitting: Nurse Practitioner

## 2022-10-30 ENCOUNTER — Other Ambulatory Visit (HOSPITAL_COMMUNITY): Payer: Self-pay

## 2022-10-30 DIAGNOSIS — Z792 Long term (current) use of antibiotics: Secondary | ICD-10-CM | POA: Diagnosis not present

## 2022-10-30 DIAGNOSIS — Z6835 Body mass index (BMI) 35.0-35.9, adult: Secondary | ICD-10-CM

## 2022-10-30 DIAGNOSIS — E669 Obesity, unspecified: Secondary | ICD-10-CM

## 2022-10-30 MED ORDER — AMOXICILLIN 500 MG PO TABS: 500.0000 mg | ORAL_TABLET | Freq: Two times a day (BID) | ORAL | 0 refills | Status: DC

## 2022-10-30 MED ORDER — WEGOVY 0.5 MG/0.5ML ~~LOC~~ SOAJ: 0.5000 mg | SUBCUTANEOUS | 2 refills | Status: DC

## 2022-10-30 NOTE — Progress Notes (Signed)
   Virtual Visit via Video Note  I connected with Cleda Mccreedy on 11/10/22 at 8:12 AM by a video enabled telemedicine application and verified that I am speaking with the correct person using two identifiers.  Patient Location: Home Provider Location: Office/Clinic  I discussed the limitations, risks, security, and privacy concerns of performing an evaluation and management service by video and the availability of in person appointments. I also discussed with the patient that there may be a patient responsible charge related to this service. The patient expressed understanding and agreed to proceed.  Subjective: PCP: Kara Dies, NP  Chief Complaint  Patient presents with   dental procedure   HPI She has a dental procedure on 11/13/22. She has h/o VSD repair as a child and need prophylactic antibiotic treatment before her dental appointment.   She states have not had any antibiotic in last 90 days.   She is taking Wegovy 0.25 Mg from last 2 months and have lots 10 pounds while on medication.  She have some nausea.  ROS: Per HPI  Current Outpatient Medications:    fluticasone (FLONASE) 50 MCG/ACT nasal spray, Place 2 sprays into both nostrils daily., Disp: 16 g, Rfl: 0   Semaglutide-Weight Management (WEGOVY) 0.5 MG/0.5ML SOAJ, Inject 0.5 mg into the skin once a week., Disp: 2 mL, Rfl: 2   SLYND 4 MG TABS, Take 1 tablet by mouth daily., Disp: , Rfl:   Observations/Objective: There were no vitals filed for this visit. Physical Exam  Assessment and Plan: Need for antibiotic prophylaxis for dental procedure Assessment & Plan: Amoxicillin sent to the pharmacy.    Class 2 obesity without serious comorbidity with body mass index (BMI) of 35.0 to 35.9 in adult, unspecified obesity type Assessment & Plan: Increase the dose of Wegovy to 0.5 Mg weekly for 4 weeks. She is not breast-feeding. Continue diet management and regular exercise   Other orders -     Amoxicillin;  Take 1 tablet (500 mg total) by mouth 2 (two) times daily for 10 days.  Dispense: 20 tablet; Refill: 0 -     Wegovy; Inject 0.5 mg into the skin once a week.  Dispense: 2 mL; Refill: 2    Follow Up Instructions: No follow-ups on file.   I discussed the assessment and treatment plan with the patient. The patient was provided an opportunity to ask questions, and all were answered. The patient agreed with the plan and demonstrated an understanding of the instructions.   The patient was advised to call back or seek an in-person evaluation if the symptoms worsen or if the condition fails to improve as anticipated.  The above assessment and management plan was discussed with the patient. The patient verbalized understanding of and has agreed to the management plan.   Kara Dies, NP

## 2022-10-30 NOTE — Telephone Encounter (Signed)
Pt need a PA for wegovy

## 2022-10-31 NOTE — Telephone Encounter (Signed)
Patient has Medicaid and they currently only covers Wegovy for  reduction of major adverse cardiovascular [CV] events (CV death, non-fatal myocardial infarction, or non-fatal stroke) in adults with established CV disease who are either obese or overweight.  Medicaid will not cover Wegovy for weight loss until 8/1. PA Team can submit PA on/after that date.

## 2022-10-31 NOTE — Telephone Encounter (Signed)
Called pt and informed her of this information. She is ok with waiting until 8/1 for the PA to be sent.

## 2022-11-06 ENCOUNTER — Telehealth: Payer: Self-pay

## 2022-11-06 NOTE — Telephone Encounter (Addendum)
Pharmacy Patient Advocate Encounter   Received notification from Patient Advice Request messages that prior authorization for Wegovy 0.5MG /0.5ML auto-injectors is required/requested.   Insurance verification completed.   The patient is insured through [facility] .   Per test claim: PA required; PA started via CoverMyMeds. KEY [id number] . Waiting for clinical questions to populate.

## 2022-11-07 NOTE — Telephone Encounter (Signed)
Clinical questions answered. PA submitted

## 2022-11-07 NOTE — Telephone Encounter (Signed)
Pharmacy Patient Advocate Encounter  Received notification from [facility] that Prior Authorization for Reginal Lutes has been DENIED. Please advise how you'd like to proceed. Full denial letter will be uploaded to the media tab. See denial reason below.  PA #/Case ID/Reference #: [id number]

## 2022-11-10 DIAGNOSIS — Z792 Long term (current) use of antibiotics: Secondary | ICD-10-CM | POA: Insufficient documentation

## 2022-11-10 NOTE — Assessment & Plan Note (Addendum)
Increase the dose of Wegovy to 0.5 Mg weekly for 4 weeks. She is not breast-feeding. Continue diet management and regular exercise

## 2022-11-10 NOTE — Telephone Encounter (Signed)
Pt would like to be called regarding her wegovy

## 2022-11-10 NOTE — Assessment & Plan Note (Signed)
Amoxicillin sent to the pharmacy.

## 2022-11-11 NOTE — Telephone Encounter (Signed)
Patient called and wanted to know if the Prior Auth could be resent. She read on Medicaid website that Medicaid was going to start covering Wegovy on August 1st. Front office did read the message from Prior Auth from 11/07/2022, but maybe they could try one more time for the patient.

## 2022-11-12 NOTE — Telephone Encounter (Signed)
Pt called stating she thinks the reason why her wegovy was denied was because the insurance did not get her BMI and weight

## 2022-11-13 ENCOUNTER — Ambulatory Visit (INDEPENDENT_AMBULATORY_CARE_PROVIDER_SITE_OTHER): Payer: MEDICAID | Admitting: Nurse Practitioner

## 2022-11-13 ENCOUNTER — Encounter: Payer: Self-pay | Admitting: Nurse Practitioner

## 2022-11-13 VITALS — BP 128/82 | HR 95 | Temp 97.9°F | Ht 62.0 in | Wt 189.8 lb

## 2022-11-13 DIAGNOSIS — Z6834 Body mass index (BMI) 34.0-34.9, adult: Secondary | ICD-10-CM

## 2022-11-13 DIAGNOSIS — E669 Obesity, unspecified: Secondary | ICD-10-CM

## 2022-11-13 NOTE — Telephone Encounter (Signed)
Spoke to pt during visit today and she is clear on what needs to be done to get [facility] approval.

## 2022-11-13 NOTE — Assessment & Plan Note (Addendum)
BMI improved. Continue diet management and regular exercise schedule. Continue wegovy 0.5 mg weekly. Will continue to monitor.  Will recheck weight in 2 months.

## 2022-11-13 NOTE — Progress Notes (Signed)
Established Patient Office Visit  Subjective:  Patient ID: [name], female    DOB: [birth year]  Age: 37 y.o. MRN: [id number]  CC:  Chief Complaint  Patient presents with   Weight Loss    HPI  [name] presents for follow up of weight management. She has lost 10 lb since the last visit in May, 2024.   She states has been watching her diet. Has stopped Soda and now drinking flavored water.   Trying to do regular physical activity.   She states that her appetite has been reduced and she feel full quickly.    She has family history of diabetes and hypertension.   HPI   Past Medical History:  Diagnosis Date   Depression    GERD (gastroesophageal reflux disease)    Hx of percutaneous transcatheter closure of congenital VSD    Migraine headache with aura    Panic attack     Past Surgical History:  Procedure Laterality Date   CARDIAC SURGERY     as a baby   TONSILLECTOMY      Family History  Problem Relation Age of Onset   Healthy Mother    Hypertension Father    Diabetes Father    Cancer Paternal Grandmother        breast   Stroke Paternal Grandfather    Asthma Paternal Aunt    Cancer Paternal Uncle        colon cancer   Congestive Heart Failure Paternal Uncle    Asthma Cousin    Congestive Heart Failure Other    Heart disease Neg Hx     Social History   Socioeconomic History   Marital status: Single    Spouse name: Not on file   Number of children: Not on file   Years of education: Not on file   Highest education level: Some college, no degree  Occupational History   Not on file  Tobacco Use   Smoking status: Former    Current packs/day: 0.00    Types: Cigarettes    Quit date: 08/16/2010    Years since quitting: 12.2   Smokeless tobacco: Never  Vaping Use   Vaping status: Never Used  Substance and Sexual Activity   Alcohol use: Not Currently    Comment: last drink drink early September   Drug use: No   Sexual activity: Yes     Partners: Male    Birth control/protection: None  Other Topics Concern   Not on file  Social History Narrative   Not on file   Social Determinants of Health   Financial Resource Strain: Medium Risk (11/12/2022)   Overall Financial Resource Strain (CARDIA)    Difficulty of Paying Living Expenses: Somewhat hard  Food Insecurity: No Food Insecurity (11/12/2022)   Hunger Vital Sign    Worried About Running Out of Food in the Last Year: Never true    Ran Out of Food in the Last Year: Never true  Transportation Needs: No Transportation Needs (11/12/2022)   PRAPARE - Administrator, Civil Service (Medical): No    Lack of Transportation (Non-Medical): No  Physical Activity: Sufficiently Active (11/12/2022)   Exercise Vital Sign    Days of Exercise per Week: 4 days    Minutes of Exercise per Session: 60 min  Stress: No Stress Concern Present (11/12/2022)   [employer] of Occupational Health - Occupational Stress Questionnaire    Feeling of Stress : Only a little  Social  Connections: Moderately Isolated (11/12/2022)   Social Connection and Isolation Panel [NHANES]    Frequency of Communication with Friends and Family: More than three times a week    Frequency of Social Gatherings with Friends and Family: Once a week    Attends Religious Services: 1 to 4 times per year    Active Member of [employer] or Organizations: No    Attends Engineer, structural: Not on file    Marital Status: Divorced  Catering manager Violence: Not on file     Outpatient Medications Prior to Visit  Medication Sig Dispense Refill   fluticasone (FLONASE) 50 MCG/ACT nasal spray Place 2 sprays into both nostrils daily. 16 g 0   Semaglutide-Weight Management (WEGOVY) 0.5 MG/0.5ML SOAJ Inject 0.5 mg into the skin once a week. 2 mL 2   SLYND 4 MG TABS Take 1 tablet by mouth daily.     No facility-administered medications prior to visit.    No Known Allergies  ROS Review of Systems Negative unless  indicated in HPI.    Objective:    Physical Exam Constitutional:      Appearance: Normal appearance. She is normal weight.  HENT:     Mouth/Throat:     Mouth: Mucous membranes are moist.  Eyes:     Extraocular Movements: Extraocular movements intact.     Conjunctiva/sclera: Conjunctivae normal.     Pupils: Pupils are equal, round, and reactive to light.  Neck:     Thyroid: No thyroid mass or thyroid tenderness.  Cardiovascular:     Rate and Rhythm: Normal rate and regular rhythm.     Pulses: Normal pulses.     Heart sounds: Normal heart sounds. No murmur heard. Pulmonary:     Effort: Pulmonary effort is normal.     Breath sounds: Normal breath sounds.  Abdominal:     General: Bowel sounds are normal.     Palpations: Abdomen is soft. There is no mass.     Tenderness: There is no abdominal tenderness. There is no rebound.  Musculoskeletal:        General: No swelling.     Cervical back: Neck supple. No tenderness.     Right lower leg: No edema.     Left lower leg: No edema.  Skin:    Findings: No bruising, erythema or rash.  Neurological:     General: No focal deficit present.     Mental Status: She is alert and oriented to person, place, and time. Mental status is at baseline.  Psychiatric:        Mood and Affect: Mood normal.        Behavior: Behavior normal.        Thought Content: Thought content normal.        Judgment: Judgment normal.     BP 128/82   Pulse 95   Temp 97.9 F (36.6 C) (Oral)   Ht 5\' 2"  (1.575 m)   Wt 189 lb 12.8 oz (86.1 kg)   LMP  (LMP Unknown)   SpO2 98%   Breastfeeding No   BMI 34.71 kg/m  Wt Readings from Last 3 Encounters:  11/13/22 189 lb 12.8 oz (86.1 kg)  09/04/22 199 lb (90.3 kg)  02/13/22 191 lb (86.6 kg)     Health Maintenance  Topic Date Due   HPV VACCINES (3 - 3-dose series) 12/19/2005   COVID-19 Vaccine (1 - 2023-24 season) Never done   INFLUENZA VACCINE  07/06/2023 (Originally 11/06/2022)   PAP SMEAR-Modifier  05/21/2024   DTaP/Tdap/Td (4 - Td or Tdap) 10/02/2031   Hepatitis C Screening  Completed   HIV Screening  Completed       Topic Date Due   HPV VACCINES (3 - 3-dose series) 12/19/2005    Lab Results  Component Value Date   TSH 1.58 09/04/2022   Lab Results  Component Value Date   WBC 7.5 09/04/2022   HGB 13.1 09/04/2022   HCT 39.9 09/04/2022   MCV 82.4 09/04/2022   PLT 300.0 09/04/2022   Lab Results  Component Value Date   NA 138 09/04/2022   K 4.5 09/04/2022   CO2 22 09/04/2022   GLUCOSE 103 (H) 09/04/2022   BUN 14 09/04/2022   CREATININE 0.63 09/04/2022   BILITOT 0.3 09/04/2022   ALKPHOS 55 09/04/2022   AST 12 09/04/2022   ALT 6 09/04/2022   PROT 7.5 09/04/2022   ALBUMIN 4.4 09/04/2022   CALCIUM 9.3 09/04/2022   ANIONGAP 9 09/27/2020   EGFR 126 09/21/2020   GFR 114.85 09/04/2022   Lab Results  Component Value Date   CHOL 162 09/04/2022   Lab Results  Component Value Date   HDL 33.10 (L) 09/04/2022   Lab Results  Component Value Date   LDLCALC 111 (H) 09/04/2022   Lab Results  Component Value Date   TRIG 91.0 09/04/2022   Lab Results  Component Value Date   CHOLHDL 5 09/04/2022   Lab Results  Component Value Date   HGBA1C 5.6 09/04/2022      Assessment & Plan:  Class 1 obesity without serious comorbidity with body mass index (BMI) of 34.0 to 34.9 in adult, unspecified obesity type Assessment & Plan: BMI improved. Continue diet management and regular exercise schedule. Continue wegovy 0.5 mg weekly. Will continue to monitor.  Will recheck weight in 2 months.       Follow-up: Return in about 2 months (around 01/13/2023) for weight check.   Kara Dies, NP

## 2022-11-17 ENCOUNTER — Encounter: Payer: Self-pay | Admitting: Nurse Practitioner

## 2022-11-18 NOTE — Telephone Encounter (Signed)
Patient just called and said Good Morning,  I received a message from my insurance again stating they needed a new PA sent over and that the one they have was sent on 08/02. Just making sure the new one has been sent with my updated weight and BMI.  She said she needs a pre authorization for a medication she is trying to get. Her number is [phone].

## 2022-11-18 NOTE — Telephone Encounter (Signed)
Please check for PA

## 2022-11-18 NOTE — Telephone Encounter (Signed)
Please appeal and send them the recent notes from 11/13/22.

## 2022-11-19 NOTE — Telephone Encounter (Signed)
Please assist with appeal letter as most recent chart notes were submitted with original PA and we currently do not have a pharmacist to handle appeals. Thank you

## 2022-11-21 NOTE — Telephone Encounter (Signed)
Pt called checking on the PA for wegovy

## 2022-11-24 ENCOUNTER — Other Ambulatory Visit (HOSPITAL_COMMUNITY): Payer: Self-pay

## 2022-11-25 ENCOUNTER — Other Ambulatory Visit (HOSPITAL_COMMUNITY): Payer: Self-pay

## 2022-11-25 NOTE — Telephone Encounter (Signed)
Pharmacy Patient Advocate Encounter  Received notification from [facility] that Prior Authorization for Wegovy 0.5MG /0.5ML auto-injectors has been APPROVED from 11/25/22 to 05/28/23   PA #/Case ID/Reference #: [id number]    All strengths of the drug are approved

## 2022-11-25 NOTE — Telephone Encounter (Signed)
Resubmitted PA, New Key: [id number]

## 2022-11-25 NOTE — Telephone Encounter (Signed)
Let the Patient know the PA was resubmitted.

## 2022-11-26 NOTE — Telephone Encounter (Signed)
Pt has been notified.

## 2023-01-13 ENCOUNTER — Ambulatory Visit: Payer: MEDICAID | Admitting: Nurse Practitioner

## 2023-02-05 ENCOUNTER — Other Ambulatory Visit: Payer: Self-pay | Admitting: Nurse Practitioner

## 2023-02-05 ENCOUNTER — Telehealth: Payer: Self-pay | Admitting: Nurse Practitioner

## 2023-02-05 MED ORDER — SEMAGLUTIDE-WEIGHT MANAGEMENT 1 MG/0.5ML ~~LOC~~ SOAJ: 1.0000 mg | SUBCUTANEOUS | 2 refills | Status: DC

## 2023-02-05 NOTE — Telephone Encounter (Signed)
Noted. Pt scheduled for 03/12/23

## 2023-02-05 NOTE — Telephone Encounter (Signed)
Patient called and said she is out of her medication. The name is Semaglutide-Weight Management (WEGOVY) 0.5 MG/0.5ML SOAJ. The pharmacy she use is [facility] [address], [place], [place] [address]. Her number is [phone]

## 2023-02-05 NOTE — Telephone Encounter (Signed)
Increased wegovy to 1 mg weekly. Please schedule a f/u in 1 month.

## 2023-02-23 ENCOUNTER — Other Ambulatory Visit: Payer: Self-pay | Admitting: Family Medicine

## 2023-02-24 ENCOUNTER — Other Ambulatory Visit: Payer: Self-pay | Admitting: *Deleted

## 2023-02-24 MED ORDER — SLYND 4 MG PO TABS: 1.0000 | ORAL_TABLET | Freq: Every day | ORAL | 6 refills | Status: DC

## 2023-03-12 ENCOUNTER — Encounter: Payer: Self-pay | Admitting: Nurse Practitioner

## 2023-03-12 ENCOUNTER — Ambulatory Visit: Payer: MEDICAID | Admitting: Nurse Practitioner

## 2023-03-12 VITALS — BP 118/72 | HR 100 | Temp 98.1°F | Ht 62.0 in | Wt 181.4 lb

## 2023-03-12 DIAGNOSIS — E66811 Obesity, class 1: Secondary | ICD-10-CM | POA: Diagnosis not present

## 2023-03-12 DIAGNOSIS — Z6834 Body mass index (BMI) 34.0-34.9, adult: Secondary | ICD-10-CM | POA: Diagnosis not present

## 2023-03-12 NOTE — Progress Notes (Signed)
Established Patient Office Visit  Subjective:  Patient ID: [name], female    DOB: 1985-11-28  Age: 37 y.o. MRN: [id number]  CC:  Chief Complaint  Patient presents with   Acute Visit    HPI  [name] presents for follow up on weight. She has lost 9 lb since last visit on 11/17/22.  Patient reports that she is actively trying to lose weight and has been compliant with the prescribed medication and diet.  She follows a regular exercise schedule and is committed to making lifestyle changes to improve her overall health.  HPI   Past Medical History:  Diagnosis Date   Depression    GERD (gastroesophageal reflux disease)    Hx of percutaneous transcatheter closure of congenital VSD    Migraine headache with aura    Panic attack     Past Surgical History:  Procedure Laterality Date   CARDIAC SURGERY     as a baby   TONSILLECTOMY      Family History  Problem Relation Age of Onset   Healthy Mother    Hypertension Father    Diabetes Father    Cancer Paternal Grandmother        breast   Stroke Paternal Grandfather    Asthma Paternal Aunt    Cancer Paternal Uncle        colon cancer   Congestive Heart Failure Paternal Uncle    Asthma Cousin    Congestive Heart Failure Other    Heart disease Neg Hx     Social History   Socioeconomic History   Marital status: Single    Spouse name: Not on file   Number of children: Not on file   Years of education: Not on file   Highest education level: Some college, no degree  Occupational History   Not on file  Tobacco Use   Smoking status: Former    Current packs/day: 0.00    Types: Cigarettes    Quit date: 08/16/2010    Years since quitting: 12.6   Smokeless tobacco: Never  Vaping Use   Vaping status: Never Used  Substance and Sexual Activity   Alcohol use: Not Currently    Comment: last drink drink early September   Drug use: No   Sexual activity: Yes    Partners: Male    Birth control/protection: None   Other Topics Concern   Not on file  Social History Narrative   Not on file   Social Drivers of Health   Financial Resource Strain: Low Risk  (03/11/2023)   Overall Financial Resource Strain (CARDIA)    Difficulty of Paying Living Expenses: Not very hard  Food Insecurity: No Food Insecurity (03/11/2023)   Hunger Vital Sign    Worried About Running Out of Food in the Last Year: Never true    Ran Out of Food in the Last Year: Never true  Transportation Needs: No Transportation Needs (03/11/2023)   PRAPARE - Administrator, Civil Service (Medical): No    Lack of Transportation (Non-Medical): No  Physical Activity: Sufficiently Active (03/11/2023)   Exercise Vital Sign    Days of Exercise per Week: 4 days    Minutes of Exercise per Session: 60 min  Stress: No Stress Concern Present (03/11/2023)   [employer] of Occupational Health - Occupational Stress Questionnaire    Feeling of Stress : Only a little  Social Connections: Moderately Isolated (03/11/2023)   Social Connection and Isolation Panel [NHANES]  Frequency of Communication with Friends and Family: Three times a week    Frequency of Social Gatherings with Friends and Family: Twice a week    Attends Religious Services: 1 to 4 times per year    Active Member of [employer] or Organizations: No    Attends Engineer, structural: Not on file    Marital Status: Divorced  Catering manager Violence: Not on file     Outpatient Medications Prior to Visit  Medication Sig Dispense Refill   fluticasone (FLONASE) 50 MCG/ACT nasal spray Place 2 sprays into both nostrils daily. 16 g 0   Semaglutide-Weight Management 1 MG/0.5ML SOAJ Inject 1 mg into the skin once a week. 2 mL 2   SLYND 4 MG TABS Take 1 tablet (4 mg total) by mouth daily. 28 tablet 6   No facility-administered medications prior to visit.    No Known Allergies  ROS Review of Systems Negative unless indicated in HPI.    Objective:    Physical  Exam Constitutional:      Appearance: Normal appearance.  HENT:     Mouth/Throat:     Mouth: Mucous membranes are moist.  Eyes:     Conjunctiva/sclera: Conjunctivae normal.     Pupils: Pupils are equal, round, and reactive to light.  Cardiovascular:     Rate and Rhythm: Normal rate and regular rhythm.     Pulses: Normal pulses.     Heart sounds: Normal heart sounds.  Pulmonary:     Effort: Pulmonary effort is normal.     Breath sounds: Normal breath sounds.  Abdominal:     General: Bowel sounds are normal.     Palpations: Abdomen is soft.  Musculoskeletal:     Cervical back: Normal range of motion. No tenderness.  Skin:    General: Skin is warm.     Findings: No bruising.  Neurological:     General: No focal deficit present.     Mental Status: She is alert and oriented to person, place, and time. Mental status is at baseline.  Psychiatric:        Mood and Affect: Mood normal.        Behavior: Behavior normal.        Thought Content: Thought content normal.        Judgment: Judgment normal.     BP 118/72   Pulse 100   Temp 98.1 F (36.7 C)   Ht 5\' 2"  (1.575 m)   Wt 181 lb 6.4 oz (82.3 kg)   SpO2 98%   BMI 33.18 kg/m  Wt Readings from Last 3 Encounters:  03/12/23 181 lb 6.4 oz (82.3 kg)  11/13/22 189 lb 12.8 oz (86.1 kg)  09/04/22 199 lb (90.3 kg)     Health Maintenance  Topic Date Due   HPV VACCINES (3 - 3-dose series) 12/19/2005   COVID-19 Vaccine (1 - 2024-25 season) 03/28/2023 (Originally 12/07/2022)   INFLUENZA VACCINE  07/06/2023 (Originally 11/06/2022)   Cervical Cancer Screening (HPV/Pap Cotest)  05/21/2026   DTaP/Tdap/Td (4 - Td or Tdap) 10/02/2031   Hepatitis C Screening  Completed   HIV Screening  Completed       Topic Date Due   HPV VACCINES (3 - 3-dose series) 12/19/2005    Lab Results  Component Value Date   TSH 1.58 09/04/2022   Lab Results  Component Value Date   WBC 7.5 09/04/2022   HGB 13.1 09/04/2022   HCT 39.9 09/04/2022   MCV  82.4 09/04/2022  PLT 300.0 09/04/2022   Lab Results  Component Value Date   NA 138 09/04/2022   K 4.5 09/04/2022   CO2 22 09/04/2022   GLUCOSE 103 (H) 09/04/2022   BUN 14 09/04/2022   CREATININE 0.63 09/04/2022   BILITOT 0.3 09/04/2022   ALKPHOS 55 09/04/2022   AST 12 09/04/2022   ALT 6 09/04/2022   PROT 7.5 09/04/2022   ALBUMIN 4.4 09/04/2022   CALCIUM 9.3 09/04/2022   ANIONGAP 9 09/27/2020   EGFR 126 09/21/2020   GFR 114.85 09/04/2022   Lab Results  Component Value Date   CHOL 162 09/04/2022   Lab Results  Component Value Date   HDL 33.10 (L) 09/04/2022   Lab Results  Component Value Date   LDLCALC 111 (H) 09/04/2022   Lab Results  Component Value Date   TRIG 91.0 09/04/2022   Lab Results  Component Value Date   CHOLHDL 5 09/04/2022   Lab Results  Component Value Date   HGBA1C 5.6 09/04/2022      Assessment & Plan:  Class 1 obesity without serious comorbidity with body mass index (BMI) of 34.0 to 34.9 in adult, unspecified obesity type Assessment & Plan: BMI improved. Continue diet management and regular exercise schedule. Continue wegovy 1 mg weekly.       Follow-up: Return in about 3 months (around 06/10/2023) for weight follow up.   Kara Dies, NP

## 2023-03-16 ENCOUNTER — Telehealth: Payer: Self-pay | Admitting: Nurse Practitioner

## 2023-03-16 NOTE — Telephone Encounter (Signed)
Prescription Request  03/16/2023  LOV: 03/12/2023  What is the name of the medication or equipment? amoxicillin  Have you contacted your pharmacy to request a refill? No   Which pharmacy would you like this sent to? [facility]  Patient notified that their request is being sent to the clinical staff for review and that they should receive a response within 2 business days.   Please advise at Mobile [phone] (mobile)

## 2023-03-17 ENCOUNTER — Telehealth: Payer: MEDICAID | Admitting: Physician Assistant

## 2023-03-17 DIAGNOSIS — K047 Periapical abscess without sinus: Secondary | ICD-10-CM | POA: Diagnosis not present

## 2023-03-17 MED ORDER — IBUPROFEN 800 MG PO TABS
800.0000 mg | ORAL_TABLET | Freq: Three times a day (TID) | ORAL | 0 refills | Status: DC | PRN
Start: 2023-03-17 — End: 2023-09-08

## 2023-03-17 MED ORDER — AMOXICILLIN 500 MG PO CAPS
500.0000 mg | ORAL_CAPSULE | Freq: Three times a day (TID) | ORAL | 0 refills | Status: AC
Start: 2023-03-17 — End: 2023-03-24

## 2023-03-17 NOTE — Telephone Encounter (Signed)
Lvm to see why pt to give office a call back wanted to know why pt is requesting antibiotic.

## 2023-03-17 NOTE — Progress Notes (Signed)
Virtual Visit Consent   [name], you are scheduled for a virtual visit with a [facility] provider today. Just as with appointments in the office, your consent must be obtained to participate. Your consent will be active for this visit and any virtual visit you may have with one of our providers in the next 365 days. If you have a MyChart account, a copy of this consent can be sent to you electronically.  As this is a virtual visit, video technology does not allow for your provider to perform a traditional examination. This may limit your provider's ability to fully assess your condition. If your provider identifies any concerns that need to be evaluated in person or the need to arrange testing (such as labs, EKG, etc.), we will make arrangements to do so. Although advances in technology are sophisticated, we cannot ensure that it will always work on either your end or our end. If the connection with a video visit is poor, the visit may have to be switched to a telephone visit. With either a video or telephone visit, we are not always able to ensure that we have a secure connection.  By engaging in this virtual visit, you consent to the provision of healthcare and authorize for your insurance to be billed (if applicable) for the services provided during this visit. Depending on your insurance coverage, you may receive a charge related to this service.  I need to obtain your verbal consent now. Are you willing to proceed with your visit today? [name] has provided verbal consent on 03/17/2023 for a virtual visit (video or telephone). Margaretann Loveless, PA-C  Date: 03/17/2023 12:34 PM  Virtual Visit via Video Note   I, Margaretann Loveless, connected with  [name]  ([id number], 06/08/1985) on 03/17/23 at 12:30 PM EST by a video-enabled telemedicine application and verified that I am speaking with the correct person using two identifiers.  Location: Patient: Virtual Visit  Location Patient: Home Provider: Virtual Visit Location Provider: Home Office   I discussed the limitations of evaluation and management by telemedicine and the availability of in person appointments. The patient expressed understanding and agreed to proceed.    History of Present Illness: [name] is a 37 y.o. who identifies as a female who was assigned female at birth, and is being seen today for dental pain, possible infection.  HPI: Dental Pain  This is a new problem. The current episode started in the past 7 days. The problem occurs constantly. The problem has been gradually worsening. The pain is mild. Associated symptoms include facial pain and thermal sensitivity. Pertinent negatives include no difficulty swallowing, fever, oral bleeding or sinus pressure. She has tried NSAIDs and acetaminophen for the symptoms. The treatment provided no relief.    Has dental appointment tomorrow.  Problems:  Patient Active Problem List   Diagnosis Date Noted   Need for antibiotic prophylaxis for dental procedure 11/10/2022   Class 1 obesity without serious comorbidity with body mass index (BMI) of 34.0 to 34.9 in adult 09/14/2022   Depression 05/21/2021   S/P VSD repair    Migraine headache with aura    GERD (gastroesophageal reflux disease) 12/01/2006    Allergies: No Known Allergies Medications:  Current Outpatient Medications:    amoxicillin (AMOXIL) 500 MG capsule, Take 1 capsule (500 mg total) by mouth 3 (three) times daily for 7 days., Disp: 21 capsule, Rfl: 0   ibuprofen (ADVIL) 800 MG tablet, Take 1  tablet (800 mg total) by mouth every 8 (eight) hours as needed., Disp: 30 tablet, Rfl: 0   fluticasone (FLONASE) 50 MCG/ACT nasal spray, Place 2 sprays into both nostrils daily., Disp: 16 g, Rfl: 0   Semaglutide-Weight Management 1 MG/0.5ML SOAJ, Inject 1 mg into the skin once a week., Disp: 2 mL, Rfl: 2   SLYND 4 MG TABS, Take 1 tablet (4 mg total) by mouth daily., Disp: 28 tablet,  Rfl: 6  Observations/Objective: Patient is well-developed, well-nourished in no acute distress.  Resting comfortably at home.  Head is normocephalic, atraumatic.  No labored breathing.  Speech is clear and coherent with logical content.  Patient is alert and oriented at baseline.    Assessment and Plan: 1. Dental infection - amoxicillin (AMOXIL) 500 MG capsule; Take 1 capsule (500 mg total) by mouth 3 (three) times daily for 7 days.  Dispense: 21 capsule; Refill: 0 - ibuprofen (ADVIL) 800 MG tablet; Take 1 tablet (800 mg total) by mouth every 8 (eight) hours as needed.  Dispense: 30 tablet; Refill: 0  - Suspected infection  - Amoxicillin and ibuprofen prescribed - Can use ice on outside jaw/cheek for swelling - Can also take tylenol for pain with other medications - Discussed DenTemp putty that can be used to cover a broken tooth - Keep scheduled dental appointment - Seek in person evaluation if symptoms fail to improve or if they worsen   Follow Up Instructions: I discussed the assessment and treatment plan with the patient. The patient was provided an opportunity to ask questions and all were answered. The patient agreed with the plan and demonstrated an understanding of the instructions.  A copy of instructions were sent to the patient via MyChart unless otherwise noted below.    The patient was advised to call back or seek an in-person evaluation if the symptoms worsen or if the condition fails to improve as anticipated.    Margaretann Loveless, PA-C

## 2023-03-17 NOTE — Telephone Encounter (Signed)
Medication sent.

## 2023-03-17 NOTE — Telephone Encounter (Signed)
Pt called back stating she has a dental appointment tomorrow and she has to have it to take before she goes

## 2023-03-17 NOTE — Patient Instructions (Signed)
[name], thank you for joining Margaretann Loveless, PA-C for today's virtual visit.  While this provider is not your primary care provider (PCP), if your PCP is located in our provider database this encounter information will be shared with them immediately following your visit.   A [place] MyChart account gives you access to today's visit and all your visits, tests, and labs performed at [facility] " click here if you don't have a [place] MyChart account or go to [email or web address]  Consent: (Patient) [name] provided verbal consent for this virtual visit at the beginning of the encounter.  Current Medications:  Current Outpatient Medications:    amoxicillin (AMOXIL) 500 MG capsule, Take 1 capsule (500 mg total) by mouth 3 (three) times daily for 7 days., Disp: 21 capsule, Rfl: 0   ibuprofen (ADVIL) 800 MG tablet, Take 1 tablet (800 mg total) by mouth every 8 (eight) hours as needed., Disp: 30 tablet, Rfl: 0   fluticasone (FLONASE) 50 MCG/ACT nasal spray, Place 2 sprays into both nostrils daily., Disp: 16 g, Rfl: 0   Semaglutide-Weight Management 1 MG/0.5ML SOAJ, Inject 1 mg into the skin once a week., Disp: 2 mL, Rfl: 2   SLYND 4 MG TABS, Take 1 tablet (4 mg total) by mouth daily., Disp: 28 tablet, Rfl: 6   Medications ordered in this encounter:  Meds ordered this encounter  Medications   amoxicillin (AMOXIL) 500 MG capsule    Sig: Take 1 capsule (500 mg total) by mouth 3 (three) times daily for 7 days.    Dispense:  21 capsule    Refill:  0    Order Specific Question:   Supervising Provider    Answer:   Merrilee Jansky [id number]   ibuprofen (ADVIL) 800 MG tablet    Sig: Take 1 tablet (800 mg total) by mouth every 8 (eight) hours as needed.    Dispense:  30 tablet    Refill:  0    Order Specific Question:   Supervising Provider    Answer:   Merrilee Jansky [id number]     *If you need refills on other medications prior to your next  appointment, please contact your pharmacy*  Follow-Up: Call back or seek an in-person evaluation if the symptoms worsen or if the condition fails to improve as anticipated.  [facility] [phone]  Other Instructions Dental Abscess  A dental abscess is an infection around a tooth that may involve pain, swelling, and a collection of pus, as well as other symptoms. Treatment is important to help with symptoms and to prevent the infection from spreading. The general types of dental abscesses are: Pulpal abscess. This abscess may form from the inner part of the tooth (pulp). Periodontal abscess. This abscess may form from the gum. What are the causes? This condition is caused by a bacterial infection in or around the tooth. It may result from: Severe tooth decay (cavities). Trauma to the tooth, such as a broken or chipped tooth. What increases the risk? This condition is more likely to develop in males. It is also more likely to develop in people who: Have cavities. Have severe gum disease. Eat sugary snacks between meals. Use tobacco products. Have diabetes. Have a weakened disease-fighting system (immune system). Do not brush and care for their teeth regularly. What are the signs or symptoms? Mild symptoms of this condition include: Tenderness. Bad breath. Fever. A bitter taste in the mouth. Pain in and around  the infected tooth. Moderate symptoms of this condition include: Swollen neck glands. Chills. Pus drainage. Swelling and redness around the infected tooth, in the mouth, or in the face. Severe pain in and around the infected tooth. Severe symptoms of this condition include: Difficulty swallowing. Difficulty opening the mouth. Nausea. Vomiting. How is this diagnosed? This condition is diagnosed based on: Your symptoms and your medical and dental history. An examination of the infected tooth. During the exam, your dental care provider may tap on the  infected tooth. You may also need to have X-rays taken of the affected area. How is this treated? This condition is treated by getting rid of the infection. This may be done with: Antibiotic medicines. These may be used in certain situations. Antibacterial mouth rinse. Incision and drainage. This procedure is done by making an incision in the abscess to drain out the pus. Removing pus is the first priority in treating an abscess. A root canal. This may be performed to save the tooth. Your dental care provider accesses the visible part of your tooth (crown) with a drill and removes any infected pulp. Then the space is filled and sealed off. Tooth extraction. The tooth is pulled out if it cannot be saved by other treatment. You may also receive treatment for pain, such as: Acetaminophen or NSAIDs. Gels that contain a numbing medicine. An injection to block the pain near your nerve. Follow these instructions at home: Medicines Take over-the-counter and prescription medicines only as told by your dental care provider. If you were prescribed an antibiotic, take it as told by your dental care provider. Do not stop taking the antibiotic even if you start to feel better. If you were prescribed a gel that contains a numbing medicine, use it exactly as told in the directions. Do not use these gels for children who are younger than 46 years of age. Use an antibacterial mouth rinse as told by your dental care provider. General instructions  Gargle with a mixture of salt and water 3-4 times a day or as needed. To make salt water, completely dissolve -1 tsp (3-6 g) of salt in 1 cup (237 mL) of warm water. Eat a soft diet while your abscess is healing. Drink enough fluid to keep your urine pale yellow. Do not apply heat to the outside of your mouth. Do not use any products that contain nicotine or tobacco. These products include cigarettes, chewing tobacco, and vaping devices, such as e-cigarettes. If you  need help quitting, ask your dental care provider. Keep all follow-up visits. This is important. How is this prevented?  Excellent dental home care, which includes brushing your teeth every morning and night with fluoride toothpaste. Floss one time each day. Get regularly scheduled dental cleanings. Consider having a dental sealant applied on teeth that have deep grooves to prevent cavities. Drink fluoridated water regularly. This includes most tap water. Check the label on bottled water to see if it contains fluoride. Reduce or eliminate sugary drinks. Eat healthy meals and snacks. Wear a mouth guard or face shield to protect your teeth while playing sports. Contact a health care provider if: Your pain is worse and is not helped by medicine. You have swelling. You see pus around the tooth. You have a fever or chills. Get help right away if: Your symptoms suddenly get worse. You have a very bad headache. You have problems breathing or swallowing. You have trouble opening your mouth. You have swelling in your neck or around your  eye. These symptoms may represent a serious problem that is an emergency. Do not wait to see if the symptoms will go away. Get medical help right away. Call your local emergency services (911 in the [place] Do not drive yourself to the hospital. Summary A dental abscess is a collection of pus in or around a tooth that results from an infection. A dental abscess may result from severe tooth decay, trauma to the tooth, or severe gum disease around a tooth. Symptoms include severe pain, swelling, redness, and drainage of pus in and around the infected tooth. The first priority in treating a dental abscess is to drain out the pus. Treatment may also involve removing damage inside the tooth (root canal) or extracting the tooth. This information is not intended to replace advice given to you by your health care provider. Make sure you discuss any questions you have with  your health care provider. Document Revised: 05/31/2020 Document Reviewed: 05/31/2020 Elsevier Patient Education  2024 Elsevier Inc.    If you have been instructed to have an in-person evaluation today at a local Urgent Care facility, please use the link below. It will take you to a list of all of our available [place] [facility], including address, phone number and hours of operation. Please do not delay care.  [facility]  If you or a family member do not have a primary care provider, use the link below to schedule a visit and establish care. When you choose a [facility] primary care physician or advanced practice provider, you gain a long-term partner in health. Find a Primary Care Provider  Learn more about [facility]'s in-office and virtual care options: [place] - Get Care Now

## 2023-03-26 NOTE — Assessment & Plan Note (Signed)
BMI improved. Continue diet management and regular exercise schedule. Continue wegovy 1 mg weekly.

## 2023-04-08 NOTE — L&D Delivery Note (Signed)
 OB/GYN Faculty Practice Delivery Note  [name] is a 37 y.o. [id number] s/p SVD at [redacted]w[redacted]d. She was admitted for IOL due to resolved IUGR.   ROM: 2h 81m with clear fluid GBS Status: Negative/-- (12/04 1100)    Labor Progress: Initial SVE: 4/80/-3. She then progressed to complete.   Delivery Date/Time: 03/28/2024 Delivery: Called to room and patient was complete and pushing. Head delivered LOA. No nuchal cord present. Shoulder and body delivered in usual fashion. Infant with spontaneous cry, placed on mother's abdomen, dried and stimulated. Cord clamped x 2 after 1-minute delay, and cut by FOB. Placenta delivered spontaneously with gentle cord traction. Fundus firm with massage and Pitocin . Labia, perineum, vagina, and cervix inspected inspected with no lacerations noted.  Baby Weight: pending  Placenta: Sent to L&D Complications: None Lacerations: N/A EBL: 70 mL Analgesia: Unmedicated   Infant:  APGAR (1 MIN):  8 APGAR (5 MINS):  9

## 2023-04-09 ENCOUNTER — Ambulatory Visit (INDEPENDENT_AMBULATORY_CARE_PROVIDER_SITE_OTHER): Payer: MEDICAID | Admitting: Obstetrics and Gynecology

## 2023-04-09 VITALS — BP 118/78 | HR 106 | Wt 180.0 lb

## 2023-04-09 DIAGNOSIS — Z3041 Encounter for surveillance of contraceptive pills: Secondary | ICD-10-CM | POA: Diagnosis not present

## 2023-04-09 DIAGNOSIS — F419 Anxiety disorder, unspecified: Secondary | ICD-10-CM | POA: Diagnosis not present

## 2023-04-09 DIAGNOSIS — F32A Depression, unspecified: Secondary | ICD-10-CM | POA: Diagnosis not present

## 2023-04-09 MED ORDER — SLYND 4 MG PO TABS: 1.0000 | ORAL_TABLET | Freq: Every day | ORAL | 1 refills | Status: DC

## 2023-04-09 NOTE — Progress Notes (Signed)
 Patient presents for Annual.  LMP: continuous  Last pap: Date: 05/21/21 Contraception: OCP Mammogram: Not yet indicated STD Screening: Declines Flu Vaccine : Declines  CC:  Annual

## 2023-04-09 NOTE — Progress Notes (Signed)
 Obstetrics and Gynecology Established Patient Evaluation  Appointment Date: 04/09/2023  OBGYN Clinic: [facility]  Primary Care Provider: Vincente Saber  Chief Complaint: OCP refill   History of Present Illness: [name] is a 38 y.o.  [phone] (No LMP recorded. (Menstrual status: Oral contraceptives).), seen for the above chief complaint. Her past medical history is significant for h/o migraines, h/o VSD repair  Patient delivered 12/2021 with us  and started on Slynd . She uses it continuously and likes it, although she does wonder if it can affect as she has times where she feels depressed, anxious. She has seen a counselor in the past and feels that she needs to re-establish care.   She ran out of pills for a few days and had a period/bleeding for a few days after  Review of Systems: A comprehensive review of systems was negative.    Patient Active Problem List   Diagnosis Date Noted   Need for antibiotic prophylaxis for dental procedure 11/10/2022   Class 1 obesity without serious comorbidity with body mass index (BMI) of 34.0 to 34.9 in adult 09/14/2022   Depression 05/21/2021   S/P VSD repair    Migraine headache with aura    GERD (gastroesophageal reflux disease) 12/01/2006    Past Medical History:  Past Medical History:  Diagnosis Date   Depression    GERD (gastroesophageal reflux disease)    Hx of percutaneous transcatheter closure of congenital VSD    Migraine headache with aura    Panic attack     Past Surgical History:  Past Surgical History:  Procedure Laterality Date   CARDIAC SURGERY     as a baby   TONSILLECTOMY      Past Obstetrical History:  OB History  Gravida Para Term Preterm AB Living  4 4 4   4   SAB IAB Ectopic Multiple Live Births     0 4    # Outcome Date GA Lbr Len/2nd Weight Sex Type Anes PTL Lv  4 Term 11/17/21 [redacted]w[redacted]d 01:42 7 lb 8.3 oz (3.41 kg) M Vag-Spont None  LIV  3 Term 09/27/20 [redacted]w[redacted]d 05:23 /  00:21 8 lb 15.7 oz (4.074 kg) M Vag-Spont None  LIV     Birth Comments: WNL  2 Term 12/05/12 [redacted]w[redacted]d 04:35 / 00:06 8 lb 2 oz (3.685 kg) M Vag-Spont None  LIV     Birth Comments: none  1 Term 06/05/06 [redacted]w[redacted]d  7 lb 1 oz (3.204 kg) M Vag-Spont None  LIV   Past Gynecological History: As per HPI. History of Pap Smear(s): Yes.   Last pap 2023, which was pap and hpv negative  Social History:  Social History   Socioeconomic History   Marital status: Single    Spouse name: Not on file   Number of children: Not on file   Years of education: Not on file   Highest education level: Some college, no degree  Occupational History   Not on file  Tobacco Use   Smoking status: Former    Current packs/day: 0.00    Types: Cigarettes    Quit date: 08/16/2010    Years since quitting: 12.6   Smokeless tobacco: Never  Vaping Use   Vaping status: Never Used  Substance and Sexual Activity   Alcohol use: Not Currently    Comment: last drink drink early September   Drug use: No   Sexual activity: Yes    Partners: Male    Birth control/protection: None  Other  Topics Concern   Not on file  Social History Narrative   Not on file   Social Drivers of Health   Financial Resource Strain: Low Risk  (03/11/2023)   Overall Financial Resource Strain (CARDIA)    Difficulty of Paying Living Expenses: Not very hard  Food Insecurity: No Food Insecurity (03/11/2023)   Hunger Vital Sign    Worried About Running Out of Food in the Last Year: Never true    Ran Out of Food in the Last Year: Never true  Transportation Needs: No Transportation Needs (03/11/2023)   PRAPARE - Administrator, Civil Service (Medical): No    Lack of Transportation (Non-Medical): No  Physical Activity: Sufficiently Active (03/11/2023)   Exercise Vital Sign    Days of Exercise per Week: 4 days    Minutes of Exercise per Session: 60 min  Stress: No Stress Concern Present (03/11/2023)   [employer] of Occupational Health -  Occupational Stress Questionnaire    Feeling of Stress : Only a little  Social Connections: Moderately Isolated (03/11/2023)   Social Connection and Isolation Panel [NHANES]    Frequency of Communication with Friends and Family: Three times a week    Frequency of Social Gatherings with Friends and Family: Twice a week    Attends Religious Services: 1 to 4 times per year    Active Member of [employer] or Organizations: No    Attends Engineer, Structural: Not on file    Marital Status: Divorced  Catering Manager Violence: Not on file    Family History:  Family History  Problem Relation Age of Onset   Healthy Mother    Hypertension Father    Diabetes Father    Cancer Paternal Grandmother        breast   Stroke Paternal Grandfather    Asthma Paternal Aunt    Cancer Paternal Uncle        colon cancer   Congestive Heart Failure Paternal Uncle    Asthma Cousin    Congestive Heart Failure Other    Heart disease Neg Hx     Medications Oluwakemi R. Spatafore had no medications administered during this visit. Current Outpatient Medications  Medication Sig Dispense Refill   fluticasone  (FLONASE ) 50 MCG/ACT nasal spray Place 2 sprays into both nostrils daily. 16 g 0   ibuprofen  (ADVIL ) 800 MG tablet Take 1 tablet (800 mg total) by mouth every 8 (eight) hours as needed. 30 tablet 0   Semaglutide -Weight Management 1 MG/0.5ML SOAJ Inject 1 mg into the skin once a week. 2 mL 2   amoxicillin  (AMOXIL ) 500 MG tablet TAKE 1 TABLET BY MOUTH TWICE A DAY FOR 10 DAYS (Patient not taking: Reported on 04/09/2023) 20 tablet 0   SLYND  4 MG TABS Take 1 tablet (4 mg total) by mouth daily. 180 tablet 1   No current facility-administered medications for this visit.   Allergies Patient has no known allergies.  Physical Exam:  BP 118/78   Pulse (!) 106   Wt 180 lb (81.6 kg)   BMI 32.92 kg/m  Body mass index is 32.92 kg/m.  General appearance: Well nourished, well developed female in no acute distress.   Neuro/Psych:  Normal mood and affect.   Laboratory: none  Radiology: none  Assessment: patient doing well  Plan:  1. Encounter for surveillance of contraceptive pills (Primary) Slynd  refill given. I did tell her I do recommend do the placebo week a few times a year.   2.  Anxiety and Depression Referral sent to Jamie at the main clinic. I told her that any type of hormone contraceptive may cause changes in emotions but not a common issue that I'm aware of with Slynd .   Return in about 1 year (around 04/08/2024).  Future Appointments  Date Time Provider Department Center  06/11/2023  1:20 PM Vincente Saber, NP [facility]    Bebe Izell Raddle MD Attending [facility] for [facility])

## 2023-04-17 NOTE — BH Specialist Note (Signed)
Integrated Behavioral Health via Telemedicine Visit  05/01/2023 [name] [id number]  Number of Integrated Behavioral Health Clinician visits: 1- Initial Visit  Session Start time: 1017   Session End time: 1119  Total time in minutes: 62   Referring Provider: Hudspeth Bing, MD Patient/Family location: Home [facility] Provider location: [facility]  All persons participating in visit: Patient [name] and [facility] Fiona Coto   Types of Service: Individual psychotherapy and Video visit  I connected with [name] and/or [name]'s  n/a  via  Telephone or [employer]  (Video is Surveyor, mining) and verified that I am speaking with the correct person using two identifiers. Discussed confidentiality: Yes   I discussed the limitations of telemedicine and the availability of in person appointments.  Discussed there is a possibility of technology failure and discussed alternative modes of communication if that failure occurs.  I discussed that engaging in this telemedicine visit, they consent to the provision of behavioral healthcare and the services will be billed under their insurance.  Patient and/or legal guardian expressed understanding and consented to Telemedicine visit: Yes   Presenting Concerns: Patient and/or family reports the following symptoms/concerns: Increased symptoms of depression and anxiety, attributes to both history of depression and anxiety, as well as overwhelmed with current life stress (managing household with two small children, moody pre-teen, homeschooling teen, helping care for mom after an accident without help from siblings, estranged from friend, etc.); pt is coping with good support from her husband, going in to work at least once weekly; was going to the gym prior to current upper respiratory illness in the household.  Duration of problem: Ongoing with  recent increase; Severity of problem: moderate  Patient and/or Family's Strengths/Protective Factors: Social connections, Concrete supports in place (healthy food, safe environments, etc.), and Sense of purpose  Goals Addressed: Patient will:  Reduce symptoms of: anxiety, depression, and stress   Increase knowledge and/or ability of: stress reduction   Demonstrate ability to: Increase healthy adjustment to current life circumstances and Increase motivation to adhere to plan of care  Progress towards Goals: Ongoing  Interventions: Interventions utilized:  Motivational Interviewing and Supportive Reflection Standardized Assessments completed: Not Needed  Patient and/or Family Response: Patient agrees with treatment plan.   Assessment: Patient currently experiencing Generalized anxiety disorder; Major depressive disorder, recurrent, mild  Patient may benefit from psychoeducation and brief therapeutic interventions regarding coping with symptoms of anxiety, depression, life stress .  Plan: Follow up with behavioral health clinician on : Two weeks Behavioral recommendations:  -Continue focusing on physical wellness until sickness resolves over the next two weeks -Continue plan to go back to part-time work and gym when no longer sick -Consider using apps of choice, as discussed, with headphones, throughout the day for 1-5 minutes as needed, for calm and distraction Referral(s): Integrated [employer] (In Clinic)  I discussed the assessment and treatment plan with the patient and/or parent/guardian. They were provided an opportunity to ask questions and all were answered. They agreed with the plan and demonstrated an understanding of the instructions.   They were advised to call back or seek an in-person evaluation if the symptoms worsen or if the condition fails to improve as anticipated.  Valetta Close Minetta Krisher, LCSW     04/09/2023    3:26 PM 03/12/2023    2:42 PM  11/13/2022    1:07 PM 10/30/2022    8:10 AM 09/04/2022  9:09 AM  Depression screen PHQ 2/9  Decreased Interest 0 0 0 0 0  Down, Depressed, Hopeless 1 0 0 0 0  PHQ - 2 Score 1 0 0 0 0  Altered sleeping 1 1 0 0 0  Tired, decreased energy 1 1 0 0 1  Change in appetite 0 0 0 0 0  Feeling bad or failure about yourself  0 0 0 0 0  Trouble concentrating 0 0 0 0 0  Moving slowly or fidgety/restless 0 0 0 0 0  Suicidal thoughts 0 0 0 0 0  PHQ-9 Score 3 2 0 0 1  Difficult doing work/chores Not difficult at all Not difficult at all Not difficult at all Not difficult at all Not difficult at all      04/09/2023    3:26 PM 03/12/2023    2:42 PM 11/13/2022    1:07 PM 10/30/2022    8:11 AM  GAD 7 : Generalized Anxiety Score  Nervous, Anxious, on Edge 1 0 0 0  Control/stop worrying 0 0 0 0  Worry too much - different things 0 0 0 0  Trouble relaxing 1 0 0 0  Restless 0 0 0 0  Easily annoyed or irritable 1 1 0 0  Afraid - awful might happen 0 0 0 0  Total GAD 7 Score 3 1 0 0  Anxiety Difficulty Not difficult at all Not difficult at all Not difficult at all Not difficult at all

## 2023-04-26 ENCOUNTER — Ambulatory Visit: Payer: MEDICAID

## 2023-04-26 ENCOUNTER — Ambulatory Visit
Admission: EM | Admit: 2023-04-26 | Discharge: 2023-04-26 | Disposition: A | Discharge status: Home / Self Care | Payer: MEDICAID | Attending: Nurse Practitioner | Admitting: Nurse Practitioner

## 2023-04-26 DIAGNOSIS — J069 Acute upper respiratory infection, unspecified: Secondary | ICD-10-CM | POA: Diagnosis not present

## 2023-04-26 LAB — POC COVID19/FLU A&B COMBO
Covid Antigen, POC: NEGATIVE
Influenza A Antigen, POC: NEGATIVE
Influenza B Antigen, POC: NEGATIVE

## 2023-04-26 MED ORDER — PROMETHAZINE-DM 6.25-15 MG/5ML PO SYRP: 5.0000 mL | ORAL_SOLUTION | Freq: Four times a day (QID) | ORAL | 0 refills | Status: DC | PRN

## 2023-04-26 NOTE — ED Provider Notes (Signed)
[facility]    CSN: [id number] Arrival date & time: 04/26/23  0949      History   Chief Complaint No chief complaint on file.   HPI [name] is a 38 y.o. female.   The history is provided by the patient.   Patient presents for complaints of chest congestion, cough, and nasal congestion that been present for the past 3 days.  Patient states this morning, she was running a fever, Tmax 101.1.  Denies headache, ear pain, wheezing, difficulty breathing, chest pain, abdominal pain, nausea, vomiting, diarrhea, or rash.  Reports she has been taking over-the-counter cold and flu medications for her symptoms.  Patient is requesting testing for COVID and flu.  Past Medical History:  Diagnosis Date   Depression    GERD (gastroesophageal reflux disease)    Hx of percutaneous transcatheter closure of congenital VSD    Migraine headache with aura    Panic attack     Patient Active Problem List   Diagnosis Date Noted   Need for antibiotic prophylaxis for dental procedure 11/10/2022   Class 1 obesity without serious comorbidity with body mass index (BMI) of 34.0 to 34.9 in adult 09/14/2022   Anxiety and depression 05/21/2021   S/P VSD repair    Migraine headache with aura    GERD (gastroesophageal reflux disease) 12/01/2006    Past Surgical History:  Procedure Laterality Date   CARDIAC SURGERY     as a baby   TONSILLECTOMY      OB History     Gravida  4   Para  4   Term  4   Preterm      AB      Living  4      SAB      IAB      Ectopic      Multiple  0   Live Births  4            Home Medications    Prior to Admission medications   Medication Sig Start Date End Date Taking? Authorizing Provider  promethazine-dextromethorphan (PROMETHAZINE-DM) 6.25-15 MG/5ML syrup Take 5 mLs by mouth 4 (four) times daily as needed. 04/26/23  Yes Leath-Warren, Sadie Haber, NP  amoxicillin (AMOXIL) 500 MG tablet TAKE 1 TABLET BY MOUTH TWICE A DAY FOR 10  DAYS Patient not taking: Reported on 04/09/2023 03/17/23   Kara Dies, NP  fluticasone (FLONASE) 50 MCG/ACT nasal spray Place 2 sprays into both nostrils daily. 04/30/22   Margaretann Loveless, PA-C  ibuprofen (ADVIL) 800 MG tablet Take 1 tablet (800 mg total) by mouth every 8 (eight) hours as needed. 03/17/23   Margaretann Loveless, PA-C  Semaglutide-Weight Management 1 MG/0.5ML SOAJ Inject 1 mg into the skin once a week. 02/05/23   Kara Dies, NP  SLYND 4 MG TABS Take 1 tablet (4 mg total) by mouth daily. 04/09/23 04/08/24  Bryce Bing, MD    Family History Family History  Problem Relation Age of Onset   Healthy Mother    Hypertension Father    Diabetes Father    Cancer Paternal Grandmother        breast   Stroke Paternal Grandfather    Asthma Paternal Aunt    Cancer Paternal Uncle        colon cancer   Congestive Heart Failure Paternal Uncle    Asthma Cousin    Congestive Heart Failure Other    Heart disease Neg Hx  Social History Social History   Tobacco Use   Smoking status: Former    Current packs/day: 0.00    Types: Cigarettes    Quit date: 08/16/2010    Years since quitting: 12.7   Smokeless tobacco: Never  Vaping Use   Vaping status: Never Used  Substance Use Topics   Alcohol use: Not Currently    Comment: last drink drink early September   Drug use: No     Allergies   Patient has no known allergies.   Review of Systems Review of Systems Per HPI  Physical Exam Triage Vital Signs ED Triage Vitals  Encounter Vitals Group     BP 04/26/23 1106 122/79     Systolic BP Percentile --      Diastolic BP Percentile --      Pulse Rate 04/26/23 1106 (!) 112     Resp 04/26/23 1106 16     Temp 04/26/23 1106 98 F (36.7 C)     Temp Source 04/26/23 1106 Oral     SpO2 04/26/23 1106 95 %     Weight --      Height --      Head Circumference --      Peak Flow --      Pain Score 04/26/23 1110 5     Pain Loc --      Pain Education --       Exclude from Growth Chart --    No data found.  Updated Vital Signs BP 122/79 (BP Location: Right Arm)   Pulse (!) 112   Temp 98 F (36.7 C) (Oral)   Resp 16   LMP  (Approximate)   SpO2 95%   Breastfeeding No   Visual Acuity Right Eye Distance:   Left Eye Distance:   Bilateral Distance:    Right Eye Near:   Left Eye Near:    Bilateral Near:     Physical Exam Vitals and nursing note reviewed.  Constitutional:      General: She is not in acute distress.    Appearance: Normal appearance.  HENT:     Head: Normocephalic.     Right Ear: Tympanic membrane, ear canal and external ear normal.     Left Ear: Tympanic membrane, ear canal and external ear normal.     Nose: Congestion present.     Right Turbinates: Enlarged and swollen.     Left Turbinates: Enlarged and swollen.     Right Sinus: No maxillary sinus tenderness or frontal sinus tenderness.     Left Sinus: No maxillary sinus tenderness or frontal sinus tenderness.     Mouth/Throat:     Mouth: Mucous membranes are moist.     Pharynx: Postnasal drip present. No pharyngeal swelling, oropharyngeal exudate, posterior oropharyngeal erythema or uvula swelling.     Comments: Cobblestoning present to posterior oropharynx  Eyes:     Extraocular Movements: Extraocular movements intact.     Conjunctiva/sclera: Conjunctivae normal.     Pupils: Pupils are equal, round, and reactive to light.  Cardiovascular:     Rate and Rhythm: Regular rhythm. Tachycardia present.     Pulses: Normal pulses.     Heart sounds: Normal heart sounds.  Pulmonary:     Effort: Pulmonary effort is normal. No respiratory distress.     Breath sounds: Normal breath sounds. No stridor. No wheezing, rhonchi or rales.  Abdominal:     General: Bowel sounds are normal.     Palpations: Abdomen is soft.  Tenderness: There is no abdominal tenderness.  Musculoskeletal:     Cervical back: Normal range of motion.  Lymphadenopathy:     Cervical: No cervical  adenopathy.  Skin:    General: Skin is warm and dry.  Neurological:     General: No focal deficit present.     Mental Status: She is alert and oriented to person, place, and time.  Psychiatric:        Mood and Affect: Mood normal.        Behavior: Behavior normal.      UC Treatments / Results  Labs (all labs ordered are listed, but only abnormal results are displayed) Labs Reviewed  POC COVID19/FLU A&B COMBO - Normal    EKG   Radiology No results found.  Procedures Procedures (including critical care time)  Medications Ordered in UC Medications - No data to display  Initial Impression / Assessment and Plan / UC Course  I have reviewed the triage vital signs and the nursing notes.  Pertinent labs & imaging results that were available during my care of the patient were reviewed by me and considered in my medical decision making (see chart for details).  The COVID/flu test was negative.  On exam, lung sounds are clear throughout, room air sats at 95%.  Symptoms consistent with a viral URI with cough.  Will provide symptomatic treatment with Promethazine DM.  Supportive care recommendations were provided and discussed with the patient to include fluids, rest, over-the-counter analgesics, and use of a humidifier during sleep.  Discussed indications with the patient regarding follow-up.  Patient was in agreement with this plan of care and verbalized understanding.  All questions were answered.  Patient stable for discharge.  Final Clinical Impressions(s) / UC Diagnoses   Final diagnoses:  Viral URI with cough     Discharge Instructions      The COVID/flu test were negative.  As discussed this most likely is a viral upper respiratory infection with cough. Take medication as prescribed. Increase fluids and allow for plenty of rest. May take over-the-counter Tylenol or ibuprofen as needed for pain, fever, or general discomfort. May use normal saline nasal spray throughout  the day for nasal congestion and runny nose. For the cough, recommend using a humidifier in your bedroom at nighttime during sleep and sleeping elevated on pillows while symptoms persist. Symptoms should improve over the next 5 to 7 days, if you experience any worsening, or symptoms extend beyond that time, you may follow-up in this clinic or with your primary care physician for further evaluation. Follow-up as needed.     ED Prescriptions     Medication Sig Dispense Auth. Provider   promethazine-dextromethorphan (PROMETHAZINE-DM) 6.25-15 MG/5ML syrup Take 5 mLs by mouth 4 (four) times daily as needed. 118 mL Leath-Warren, Sadie Haber, NP      PDMP not reviewed this encounter.   Abran Cantor, NP 04/26/23 1136

## 2023-04-26 NOTE — ED Triage Notes (Signed)
Pt reports chest congestion and cough x 3 day. Has tired OTC cold and flu medicine for sx's  pt states this morning she woke up with 101.1 fever.

## 2023-04-26 NOTE — Discharge Instructions (Addendum)
The COVID/flu test were negative.  As discussed this most likely is a viral upper respiratory infection with cough. Take medication as prescribed. Increase fluids and allow for plenty of rest. May take over-the-counter Tylenol or ibuprofen as needed for pain, fever, or general discomfort. May use normal saline nasal spray throughout the day for nasal congestion and runny nose. For the cough, recommend using a humidifier in your bedroom at nighttime during sleep and sleeping elevated on pillows while symptoms persist. Symptoms should improve over the next 5 to 7 days, if you experience any worsening, or symptoms extend beyond that time, you may follow-up in this clinic or with your primary care physician for further evaluation. Follow-up as needed.

## 2023-04-30 ENCOUNTER — Telehealth: Payer: MEDICAID

## 2023-04-30 DIAGNOSIS — J209 Acute bronchitis, unspecified: Secondary | ICD-10-CM | POA: Diagnosis not present

## 2023-04-30 DIAGNOSIS — R058 Other specified cough: Secondary | ICD-10-CM

## 2023-04-30 MED ORDER — PREDNISONE 20 MG PO TABS: 40.0000 mg | ORAL_TABLET | Freq: Every day | ORAL | 0 refills | Status: AC

## 2023-04-30 MED ORDER — BENZONATATE 100 MG PO CAPS: 100.0000 mg | ORAL_CAPSULE | Freq: Three times a day (TID) | ORAL | 0 refills | Status: DC | PRN

## 2023-04-30 MED ORDER — AZITHROMYCIN 250 MG PO TABS: ORAL_TABLET | ORAL | 0 refills | Status: AC

## 2023-04-30 MED ORDER — ALBUTEROL SULFATE HFA 108 (90 BASE) MCG/ACT IN AERS: 1.0000 | INHALATION_SPRAY | Freq: Four times a day (QID) | RESPIRATORY_TRACT | 0 refills | Status: DC | PRN

## 2023-04-30 NOTE — Patient Instructions (Signed)
Cleda Mccreedy, thank you for joining Freddy Finner, NP for today's virtual visit.  While this provider is not your primary care provider (PCP), if your PCP is located in our provider database this encounter information will be shared with them immediately following your visit.   A [facility] MyChart account gives you access to today's visit and all your visits, tests, and labs performed at [facility] " click here if you don't have a [facility] MyChart account or go to [email or web address]  Consent: (Patient) [name] provided verbal consent for this virtual visit at the beginning of the encounter.  Current Medications:  Current Outpatient Medications:    albuterol (VENTOLIN HFA) 108 (90 Base) MCG/ACT inhaler, Inhale 1-2 puffs into the lungs every 6 (six) hours as needed for wheezing or shortness of breath (cough)., Disp: 8 g, Rfl: 0   [START ON 05/02/2023] azithromycin (ZITHROMAX) 250 MG tablet, Take 2 tablets on day 1, then 1 tablet daily on days 2 through 5, Disp: 6 tablet, Rfl: 0   benzonatate (TESSALON) 100 MG capsule, Take 1 capsule (100 mg total) by mouth 3 (three) times daily as needed for cough., Disp: 30 capsule, Rfl: 0   predniSONE (DELTASONE) 20 MG tablet, Take 2 tablets (40 mg total) by mouth daily with breakfast for 5 days., Disp: 10 tablet, Rfl: 0   amoxicillin (AMOXIL) 500 MG tablet, TAKE 1 TABLET BY MOUTH TWICE A DAY FOR 10 DAYS (Patient not taking: Reported on 04/09/2023), Disp: 20 tablet, Rfl: 0   fluticasone (FLONASE) 50 MCG/ACT nasal spray, Place 2 sprays into both nostrils daily., Disp: 16 g, Rfl: 0   ibuprofen (ADVIL) 800 MG tablet, Take 1 tablet (800 mg total) by mouth every 8 (eight) hours as needed., Disp: 30 tablet, Rfl: 0   promethazine-dextromethorphan (PROMETHAZINE-DM) 6.25-15 MG/5ML syrup, Take 5 mLs by mouth 4 (four) times daily as needed., Disp: 118 mL, Rfl: 0   Semaglutide-Weight Management 1 MG/0.5ML SOAJ, Inject 1 mg into the skin once  a week., Disp: 2 mL, Rfl: 2   SLYND 4 MG TABS, Take 1 tablet (4 mg total) by mouth daily., Disp: 180 tablet, Rfl: 1   Medications ordered in this encounter:  Meds ordered this encounter  Medications   predniSONE (DELTASONE) 20 MG tablet    Sig: Take 2 tablets (40 mg total) by mouth daily with breakfast for 5 days.    Dispense:  10 tablet    Refill:  0    Supervising Provider:   Merrilee Jansky [[id number]]   azithromycin (ZITHROMAX) 250 MG tablet    Sig: Take 2 tablets on day 1, then 1 tablet daily on days 2 through 5    Dispense:  6 tablet    Refill:  0    Supervising Provider:   Merrilee Jansky [[id number]]   benzonatate (TESSALON) 100 MG capsule    Sig: Take 1 capsule (100 mg total) by mouth 3 (three) times daily as needed for cough.    Dispense:  30 capsule    Refill:  0    Supervising Provider:   Merrilee Jansky [[id number]]   albuterol (VENTOLIN HFA) 108 (90 Base) MCG/ACT inhaler    Sig: Inhale 1-2 puffs into the lungs every 6 (six) hours as needed for wheezing or shortness of breath (cough).    Dispense:  8 g    Refill:  0    Supervising Provider:   Merrilee Jansky [id number]     *If you need  refills on other medications prior to your next appointment, please contact your pharmacy*  Follow-Up: Call back or seek an in-person evaluation if the symptoms worsen or if the condition fails to improve as anticipated.  [facility] [phone]  Other Instructions - Take meds as prescribed- Delayed Rx for possible start of bacterial given fevers- if they do not improve in 48 hours - Rest voice - Use a cool mist humidifier especially during the winter months when heat dries out the air. - Use saline nose sprays frequently to help soothe nasal passages if they are drying out. - Stay hydrated by drinking plenty of fluids - Keep thermostat turn down low to prevent drying out which can cause a dry cough. - For any cough or congestion- robitussin DM or Delsym as  needed - For fever or aches or pains- take tylenol or ibuprofen as directed on bottle             * for fevers greater than 101 orally you may alternate ibuprofen and tylenol every 3 hours.  If you do not improve you will need a follow up visit in person.                  If you have been instructed to have an in-person evaluation today at a local Urgent Care facility, please use the link below. It will take you to a list of all of our available [facility], including address, phone number and hours of operation. Please do not delay care.  [facility]  If you or a family member do not have a primary care provider, use the link below to schedule a visit and establish care. When you choose a [facility] primary care physician or advanced practice provider, you gain a long-term partner in health. Find a Primary Care Provider  Learn more about [facility]'s in-office and virtual care options: [facility] - Get Care Now

## 2023-04-30 NOTE — Progress Notes (Signed)
Virtual Visit Consent   [name], you are scheduled for a virtual visit with a  provider today. Just as with appointments in the office, your consent must be obtained to participate. Your consent will be active for this visit and any virtual visit you may have with one of our providers in the next 365 days. If you have a MyChart account, a copy of this consent can be sent to you electronically.  As this is a virtual visit, video technology does not allow for your provider to perform a traditional examination. This may limit your provider's ability to fully assess your condition. If your provider identifies any concerns that need to be evaluated in person or the need to arrange testing (such as labs, EKG, etc.), we will make arrangements to do so. Although advances in technology are sophisticated, we cannot ensure that it will always work on either your end or our end. If the connection with a video visit is poor, the visit may have to be switched to a telephone visit. With either a video or telephone visit, we are not always able to ensure that we have a secure connection.  By engaging in this virtual visit, you consent to the provision of healthcare and authorize for your insurance to be billed (if applicable) for the services provided during this visit. Depending on your insurance coverage, you may receive a charge related to this service.  I need to obtain your verbal consent now. Are you willing to proceed with your visit today? [name] has provided verbal consent on 04/30/2023 for a virtual visit (video or telephone). Freddy Finner, NP  Date: 04/30/2023 9:29 AM  Virtual Visit via Video Note   I, Freddy Finner, connected with  [name]  ([id number], Jan 26, 1986) on 04/30/23 at  9:30 AM EST by a video-enabled telemedicine application and verified that I am speaking with the correct person using two identifiers.  Location: Patient: Virtual Visit Location Patient:  Home Provider: Virtual Visit Location Provider: Home Office   I discussed the limitations of evaluation and management by telemedicine and the availability of in person appointments. The patient expressed understanding and agreed to proceed.    History of Present Illness: [name] is a 38 y.o. who identifies as a female who was assigned female at birth, and is being seen today for URI- viral   Onset was 04/23/23-  was Dx with viral URI.  Associated symptoms are congestion is clear- nasal, chest is tan color, fevers are going up consistently- 101 (99-101) Modifying factors are tylenol and motrin Denies chest pain, shortness of breath   Problems:  Patient Active Problem List   Diagnosis Date Noted   Need for antibiotic prophylaxis for dental procedure 11/10/2022   Class 1 obesity without serious comorbidity with body mass index (BMI) of 34.0 to 34.9 in adult 09/14/2022   Anxiety and depression 05/21/2021   S/P VSD repair    Migraine headache with aura    GERD (gastroesophageal reflux disease) 12/01/2006    Allergies: No Known Allergies Medications:  Current Outpatient Medications:    amoxicillin (AMOXIL) 500 MG tablet, TAKE 1 TABLET BY MOUTH TWICE A DAY FOR 10 DAYS (Patient not taking: Reported on 04/09/2023), Disp: 20 tablet, Rfl: 0   fluticasone (FLONASE) 50 MCG/ACT nasal spray, Place 2 sprays into both nostrils daily., Disp: 16 g, Rfl: 0   ibuprofen (ADVIL) 800 MG tablet, Take 1 tablet (800 mg total) by mouth every 8 (  eight) hours as needed., Disp: 30 tablet, Rfl: 0   promethazine-dextromethorphan (PROMETHAZINE-DM) 6.25-15 MG/5ML syrup, Take 5 mLs by mouth 4 (four) times daily as needed., Disp: 118 mL, Rfl: 0   Semaglutide-Weight Management 1 MG/0.5ML SOAJ, Inject 1 mg into the skin once a week., Disp: 2 mL, Rfl: 2   SLYND 4 MG TABS, Take 1 tablet (4 mg total) by mouth daily., Disp: 180 tablet, Rfl: 1  Observations/Objective: Patient is well-developed, well-nourished in no  acute distress.  Resting comfortably  at home.  Head is normocephalic, atraumatic.  No labored breathing.  Speech is clear and coherent with logical content.  Patient is alert and oriented at baseline.  Cough present   Assessment and Plan:  1. Post-viral cough syndrome (Primary)  - predniSONE (DELTASONE) 20 MG tablet; Take 2 tablets (40 mg total) by mouth daily with breakfast for 5 days.  Dispense: 10 tablet; Refill: 0 - benzonatate (TESSALON) 100 MG capsule; Take 1 capsule (100 mg total) by mouth 3 (three) times daily as needed for cough.  Dispense: 30 capsule; Refill: 0 - albuterol (VENTOLIN HFA) 108 (90 Base) MCG/ACT inhaler; Inhale 1-2 puffs into the lungs every 6 (six) hours as needed for wheezing or shortness of breath (cough).  Dispense: 8 g; Refill: 0  2. Acute bronchitis, unspecified organism  - predniSONE (DELTASONE) 20 MG tablet; Take 2 tablets (40 mg total) by mouth daily with breakfast for 5 days.  Dispense: 10 tablet; Refill: 0 - azithromycin (ZITHROMAX) 250 MG tablet; Take 2 tablets on day 1, then 1 tablet daily on days 2 through 5  Dispense: 6 tablet; Refill: 0 - benzonatate (TESSALON) 100 MG capsule; Take 1 capsule (100 mg total) by mouth 3 (three) times daily as needed for cough.  Dispense: 30 capsule; Refill: 0 - albuterol (VENTOLIN HFA) 108 (90 Base) MCG/ACT inhaler; Inhale 1-2 puffs into the lungs every 6 (six) hours as needed for wheezing or shortness of breath (cough).  Dispense: 8 g; Refill: 0  - Take meds as prescribed- Delayed Rx for possible start of bacterial given fevers- if they do not improve in 48 hours - Rest voice - Use a cool mist humidifier especially during the winter months when heat dries out the air. - Use saline nose sprays frequently to help soothe nasal passages if they are drying out. - Stay hydrated by drinking plenty of fluids - Keep thermostat turn down low to prevent drying out which can cause a dry cough. - For any cough or congestion-  robitussin DM or Delsym as needed - For fever or aches or pains- take tylenol or ibuprofen as directed on bottle             * for fevers greater than 101 orally you may alternate ibuprofen and tylenol every 3 hours.  If you do not improve you will need a follow up visit in person.                Reviewed side effects, risks and benefits of medication.    Patient acknowledged agreement and understanding of the plan.   Past Medical, Surgical, Social History, Allergies, and Medications have been Reviewed.    Follow Up Instructions: I discussed the assessment and treatment plan with the patient. The patient was provided an opportunity to ask questions and all were answered. The patient agreed with the plan and demonstrated an understanding of the instructions.  A copy of instructions were sent to the patient via MyChart unless otherwise noted below.  The patient was advised to call back or seek an in-person evaluation if the symptoms worsen or if the condition fails to improve as anticipated.    Freddy Finner, NP

## 2023-05-01 ENCOUNTER — Ambulatory Visit (INDEPENDENT_AMBULATORY_CARE_PROVIDER_SITE_OTHER): Payer: MEDICAID | Admitting: Clinical

## 2023-05-01 DIAGNOSIS — F411 Generalized anxiety disorder: Secondary | ICD-10-CM | POA: Diagnosis not present

## 2023-05-01 DIAGNOSIS — F330 Major depressive disorder, recurrent, mild: Secondary | ICD-10-CM

## 2023-05-01 NOTE — Patient Instructions (Signed)
[facility] [address] [place], [place] [address] [phone] (main office) [phone] ([name]'s office)  /Emotional Wellbeing Apps and Websites Here are a few free apps meant to help you to help yourself.  To find, try searching on the internet to see if the app is offered on Apple/Android devices. If your first choice doesn't come up on your device, the good news is that there are many choices! Play around with different apps to see which ones are helpful to you.    Calm This is an app meant to help increase calm feelings. Includes info, strategies, and tools for tracking your feelings.      Calm Harm  This app is meant to help with self-harm. Provides many 5-minute or 15-min coping strategies for doing instead of hurting yourself.       Healthy Minds Health Minds is a problem-solving tool to help deal with emotions and cope with stress you encounter wherever you are.      MindShift This app can help people cope with anxiety. Rather than trying to avoid anxiety, you can make an important shift and face it.      MY3  MY3 features a support system, safety plan and resources with the goal of offering a tool to use in a time of need.       My Life My Voice  This mood journal offers a simple solution for tracking your thoughts, feelings and moods. Animated emoticons can help identify your mood.       Relax Melodies Designed to help with sleep, on this app you can mix sounds and meditations for relaxation.      Smiling Mind Smiling Mind is meditation made easy: it's a simple tool that helps put a smile on your mind.        Stop, Breathe & Think  A friendly, simple guide for people through meditations for mindfulness and compassion.  Stop, Breathe and Think Kids Enter your current feelings and choose a "mission" to help you cope. Offers videos for certain moods instead of just sound recordings.       Team  Orange The goal of this tool is to help teens change how they think, act, and react. This app helps you focus on your own good feelings and experiences.      The [employer] Box The [employer] Box (VHB) contains simple tools to help patients with coping, relaxation, distraction, and positive thinking.

## 2023-05-04 NOTE — BH Specialist Note (Signed)
 Pt did not arrive to video visit and did not answer the phone; Left HIPPA-compliant message to call back Carolyn Cisco from [employer] for [employer] at [facility] at  [phone] [facility] office).  ?; left MyChart message for patient.  ? ?

## 2023-05-15 ENCOUNTER — Ambulatory Visit: Payer: MEDICAID | Admitting: Clinical

## 2023-05-15 DIAGNOSIS — Z91199 Patient's noncompliance with other medical treatment and regimen due to unspecified reason: Secondary | ICD-10-CM

## 2023-06-11 ENCOUNTER — Ambulatory Visit: Payer: Self-pay | Admitting: Nurse Practitioner

## 2023-07-02 ENCOUNTER — Ambulatory Visit: Payer: MEDICAID | Admitting: Nurse Practitioner

## 2023-08-08 ENCOUNTER — Encounter: Payer: Self-pay | Admitting: Obstetrics and Gynecology

## 2023-08-22 ENCOUNTER — Ambulatory Visit
Admission: EM | Admit: 2023-08-22 | Discharge: 2023-08-22 | Disposition: A | Discharge status: Home / Self Care | Payer: MEDICAID | Attending: Emergency Medicine | Admitting: Emergency Medicine

## 2023-08-22 ENCOUNTER — Telehealth: Payer: MEDICAID | Admitting: Nurse Practitioner

## 2023-08-22 DIAGNOSIS — O219 Vomiting of pregnancy, unspecified: Secondary | ICD-10-CM

## 2023-08-22 DIAGNOSIS — Z3A01 Less than 8 weeks gestation of pregnancy: Secondary | ICD-10-CM

## 2023-08-22 DIAGNOSIS — J029 Acute pharyngitis, unspecified: Secondary | ICD-10-CM | POA: Diagnosis not present

## 2023-08-22 DIAGNOSIS — O98511 Other viral diseases complicating pregnancy, first trimester: Secondary | ICD-10-CM

## 2023-08-22 LAB — POCT RAPID STREP A (OFFICE): Rapid Strep A Screen: NEGATIVE

## 2023-08-22 NOTE — ED Triage Notes (Signed)
 Patient to Urgent Care with complaints of a sore throat. Denies any fevers. Describes feeling like she has a lot of drainage/ something "stuck in the back of her throat".  Symptoms started yesterday. Worse this morning.  Reports being almost [redacted] weeks pregnant LMP 06/28/23.

## 2023-08-22 NOTE — Progress Notes (Signed)
 I have spent 5 minutes in review of e-visit questionnaire, review and updating patient chart, medical decision making and response to patient.   Claiborne Rigg, NP

## 2023-08-22 NOTE — ED Provider Notes (Signed)
 [name]    CSN: [id number] Arrival date & time: 08/22/23  0805      History   Chief Complaint Chief Complaint  Patient presents with   Sore Throat    HPI [name] is a 38 y.o. female.  Patient is [redacted]w[redacted]d pregnant.  [name] presents with 1 day history of sore throat.  No fever, rash, arthralgias, cough, shortness of breath, abdominal pain.  No OTC medications this morning.  The history is provided by the patient and medical records.    Past Medical History:  Diagnosis Date   Depression    GERD (gastroesophageal reflux disease)    Hx of percutaneous transcatheter closure of congenital VSD    Migraine headache with aura    Panic attack     Patient Active Problem List   Diagnosis Date Noted   Need for antibiotic prophylaxis for dental procedure 11/10/2022   Class 1 obesity without serious comorbidity with body mass index (BMI) of 34.0 to 34.9 in adult 09/14/2022   Anxiety and depression 05/21/2021   S/P VSD repair    Migraine headache with aura    GERD (gastroesophageal reflux disease) 12/01/2006    Past Surgical History:  Procedure Laterality Date   CARDIAC SURGERY     as a baby   TONSILLECTOMY      OB History     Gravida  5   Para  4   Term  4   Preterm      AB      Living  4      SAB      IAB      Ectopic      Multiple  0   Live Births  4            Home Medications    Prior to Admission medications   Medication Sig Start Date End Date Taking? Authorizing Provider  albuterol  (VENTOLIN  HFA) 108 (90 Base) MCG/ACT inhaler Inhale 1-2 puffs into the lungs every 6 (six) hours as needed for wheezing or shortness of breath (cough). 04/30/23   Lanetta Pion, NP  amoxicillin  (AMOXIL ) 500 MG tablet TAKE 1 TABLET BY MOUTH TWICE A DAY FOR 10 DAYS Patient not taking: Reported on 04/09/2023 03/17/23   Kaur, Charanpreet, NP  benzonatate  (TESSALON ) 100 MG capsule Take 1 capsule (100 mg total) by mouth 3 (three) times daily as needed  for cough. Patient not taking: Reported on 08/22/2023 04/30/23   Lanetta Pion, NP  fluticasone  (FLONASE ) 50 MCG/ACT nasal spray Place 2 sprays into both nostrils daily. 04/30/22   Angelia Kelp, PA-C  ibuprofen  (ADVIL ) 800 MG tablet Take 1 tablet (800 mg total) by mouth every 8 (eight) hours as needed. 03/17/23   Angelia Kelp, PA-C  promethazine -dextromethorphan (PROMETHAZINE -DM) 6.25-15 MG/5ML syrup Take 5 mLs by mouth 4 (four) times daily as needed. Patient not taking: Reported on 08/22/2023 04/26/23   Leath-Warren, Belen Bowers, NP  Semaglutide -Weight Management 1 MG/0.5ML SOAJ Inject 1 mg into the skin once a week. Patient not taking: Reported on 08/22/2023 02/05/23   Tona Francis, NP  SLYND  4 MG TABS Take 1 tablet (4 mg total) by mouth daily. Patient not taking: Reported on 08/22/2023 04/09/23 04/08/24  Raynell Caller, MD    Family History Family History  Problem Relation Age of Onset   Healthy Mother    Hypertension Father    Diabetes Father    Cancer Paternal Grandmother  breast   Stroke Paternal Grandfather    Asthma Paternal Aunt    Cancer Paternal Uncle        colon cancer   Congestive Heart Failure Paternal Uncle    Asthma Cousin    Congestive Heart Failure Other    Heart disease Neg Hx     Social History Social History   Tobacco Use   Smoking status: Former    Current packs/day: 0.00    Types: Cigarettes    Quit date: 08/16/2010    Years since quitting: 13.0   Smokeless tobacco: Never  Vaping Use   Vaping status: Never Used  Substance Use Topics   Alcohol use: Not Currently    Comment: last drink drink early September   Drug use: No     Allergies   Patient has no known allergies.   Review of Systems Review of Systems  Constitutional:  Negative for chills and fever.  HENT:  Positive for sore throat. Negative for ear pain.   Respiratory:  Negative for cough and shortness of breath.   Gastrointestinal:  Negative for abdominal pain.      Physical Exam Triage Vital Signs ED Triage Vitals  Encounter Vitals Group     BP      Systolic BP Percentile      Diastolic BP Percentile      Pulse      Resp      Temp      Temp src      SpO2      Weight      Height      Head Circumference      Peak Flow      Pain Score      Pain Loc      Pain Education      Exclude from Growth Chart    No data found.  Updated Vital Signs BP 124/81   Pulse 99   Temp 98.4 F (36.9 C)   Resp 18   LMP 06/28/2023   SpO2 95%   Visual Acuity Right Eye Distance:   Left Eye Distance:   Bilateral Distance:    Right Eye Near:   Left Eye Near:    Bilateral Near:     Physical Exam Constitutional:      General: [name] is not in acute distress. HENT:     Right Ear: Tympanic membrane normal.     Left Ear: Tympanic membrane normal.     Nose: Nose normal.     Mouth/Throat:     Mouth: Mucous membranes are moist.     Pharynx: Posterior oropharyngeal erythema present.  Cardiovascular:     Rate and Rhythm: Normal rate and regular rhythm.     Heart sounds: Normal heart sounds.  Pulmonary:     Effort: Pulmonary effort is normal. No respiratory distress.     Breath sounds: Normal breath sounds.  Neurological:     Mental Status: [name] is alert.      UC Treatments / Results  Labs (all labs ordered are listed, but only abnormal results are displayed) Labs Reviewed  POCT RAPID STREP A (OFFICE)    EKG   Radiology No results found.  Procedures Procedures (including critical care time)  Medications Ordered in UC Medications - No data to display  Initial Impression / Assessment and Plan / UC Course  I have reviewed the triage vital signs and the nursing notes.  Pertinent labs & imaging results that were available during my care of  the patient were reviewed by me and considered in my medical decision making (see chart for details).    [redacted]w[redacted]d pregnant, Viral pharyngitis.  Rapid strep negative.  Discussed symptomatic management  including Tylenol  as needed.  Instructed patient to take OTC medications only as approved by her OB/GYN.  Education provided on first trimester pregnancy and sore throat.  Instructed her to follow-up with her PCP or OB/GYN if her symptoms are not improving.  [name] agrees to plan of care.  Final Clinical Impressions(s) / UC Diagnoses   Final diagnoses:  Less than [redacted] weeks gestation of pregnancy  Viral pharyngitis     Discharge Instructions      Your strep is negative.  Take Tylenol  as needed for discomfort.  Follow-up with your primary care provider or OB/GYN if your symptoms are not improving.   ED Prescriptions   None    PDMP not reviewed this encounter.   Wellington Half, NP 08/22/23 [phone]

## 2023-08-22 NOTE — Progress Notes (Signed)
 E-Visit for Nausea and Vomiting   We are sorry that you are not feeling well. Here is how we plan to help!    Although nausea and vomiting can make you feel miserable, it's important to remember that these are not diseases, but rather symptoms of an underlying illness.  When we treat short term symptoms, we always caution that any symptoms that persist should be fully evaluated in a medical office.  You would need to contact the after hours OB/GYN for further instructions or you can over the counter B6 to see if this will help first.    HOME CARE: Drink clear liquids.  This is very important! Dehydration (the lack of fluid) can lead to a serious complication.  Start off with 1 tablespoon every 5 minutes for 8 hours. You may begin eating bland foods after 8 hours without vomiting.  Start with saltine crackers, white bread, rice, mashed potatoes, applesauce. After 48 hours on a bland diet, you may resume a normal diet. Try to go to sleep.  Sleep often empties the stomach and relieves the need to vomit.  GET HELP RIGHT AWAY IF:  Your symptoms do not improve or worsen within 2 days after treatment. You have a fever for over 3 days. You cannot keep down fluids after trying the medication.  MAKE SURE YOU:  Understand these instructions. Will watch your condition. Will get help right away if you are not doing well or get worse.    Thank you for choosing an e-visit.  Your e-visit answers were reviewed by a board certified advanced clinical practitioner to complete your personal care plan. Depending upon the condition, your plan could have included both over the counter or prescription medications.  Please review your pharmacy choice. Make sure the pharmacy is open so you can pick up prescription now. If there is a problem, you may contact your provider through [employer] and have the prescription routed to another pharmacy.  Your safety is important to us . If you have drug allergies  check your prescription carefully.   For the next 24 hours you can use MyChart to ask questions about today's visit, request a non-urgent call back, or ask for a work or school excuse. You will get an email in the next two days asking about your experience. I hope that your e-visit has been valuable and will speed your recovery.

## 2023-08-22 NOTE — Discharge Instructions (Addendum)
 Your strep is negative.  Take Tylenol  as needed for discomfort.  Follow-up with your primary care provider or OB/GYN if your symptoms are not improving.

## 2023-09-03 ENCOUNTER — Other Ambulatory Visit: Payer: Self-pay | Admitting: *Deleted

## 2023-09-03 MED ORDER — METOCLOPRAMIDE HCL 10 MG PO TABS: 10.0000 mg | ORAL_TABLET | Freq: Four times a day (QID) | ORAL | 2 refills | Status: DC | PRN

## 2023-09-03 MED ORDER — PROMETHAZINE HCL 25 MG PO TABS: 25.0000 mg | ORAL_TABLET | Freq: Four times a day (QID) | ORAL | 2 refills | Status: DC | PRN

## 2023-09-08 ENCOUNTER — Ambulatory Visit: Payer: MEDICAID | Admitting: *Deleted

## 2023-09-08 ENCOUNTER — Ambulatory Visit: Payer: MEDICAID

## 2023-09-08 ENCOUNTER — Other Ambulatory Visit (INDEPENDENT_AMBULATORY_CARE_PROVIDER_SITE_OTHER): Payer: MEDICAID

## 2023-09-08 VITALS — BP 119/81 | HR 81 | Wt 189.0 lb

## 2023-09-08 DIAGNOSIS — Z3480 Encounter for supervision of other normal pregnancy, unspecified trimester: Secondary | ICD-10-CM | POA: Insufficient documentation

## 2023-09-08 DIAGNOSIS — Z3A10 10 weeks gestation of pregnancy: Secondary | ICD-10-CM | POA: Diagnosis not present

## 2023-09-08 DIAGNOSIS — Z3481 Encounter for supervision of other normal pregnancy, first trimester: Secondary | ICD-10-CM

## 2023-09-08 DIAGNOSIS — O3680X0 Pregnancy with inconclusive fetal viability, not applicable or unspecified: Secondary | ICD-10-CM

## 2023-09-08 DIAGNOSIS — O09529 Supervision of elderly multigravida, unspecified trimester: Secondary | ICD-10-CM | POA: Insufficient documentation

## 2023-09-08 NOTE — Progress Notes (Signed)
 New OB Intake  I explained I am completing New OB Intake today. We discussed EDD of 04/03/2024, by Last Menstrual Period. Pt is G5P4004. I reviewed her allergies, medications and Medical/Surgical/OB history.    Patient Active Problem List   Diagnosis Date Noted   Supervision of other normal pregnancy, antepartum 09/08/2023   Advanced maternal age in multigravida 09/08/2023   Class 1 obesity without serious comorbidity with body mass index (BMI) of 34.0 to 34.9 in adult 09/14/2022   Anxiety and depression 05/21/2021   S/P VSD repair    Migraine headache with aura    GERD (gastroesophageal reflux disease) 12/01/2006    Concerns addressed today  Patient informed that the ultrasound is considered a limited obstetric ultrasound and is not intended to be a complete ultrasound exam.  Patient also informed that the ultrasound is not being completed with the intent of assessing for fetal or placental anomalies or any pelvic abnormalities. Explained that the purpose of today's ultrasound is to assess for viability.  Patient acknowledges the purpose of the exam and the limitations of the study.     Delivery Plans Plans to deliver at [facility]. Discussed the nature of our practice with multiple providers including residents and students. Due to the size of the practice, the delivering provider may not be the same as those providing prenatal care.   MyChart/Babyscripts MyChart access verified. I explained pt will have some visits in office and some virtually. Babyscripts app discussed and ordered.   Blood Pressure Cuff Blood pressure cuff discussed and givenDiscussed to be used for virtual visits and or if needed BP checks weekly.  Anatomy US  Explained first scheduled US  will be around 19 weeks.   Last Pap Diagnosis  Date Value Ref Range Status  05/21/2021   Final   - Negative for intraepithelial lesion or malignancy (NILM)    First visit review I reviewed new OB appt with patient. Explained  pt will be seen by Dr Donetta Furl at first visit. Discussed [facility] [place] genetic screening with patient will get panorama at [facility]. Routine prenatal labs to be collected at new OB.    Bettey Browning, RN 09/08/2023  10:46 AM

## 2023-09-21 IMAGING — US US MFM OB DETAIL+14 WK
1 series · 13 of 28 positions shown · non-contrast
Comparison: none

[Series 1: us mfm ob detail+14 wk · 13 of 117 slices shown]
[im 5/117]
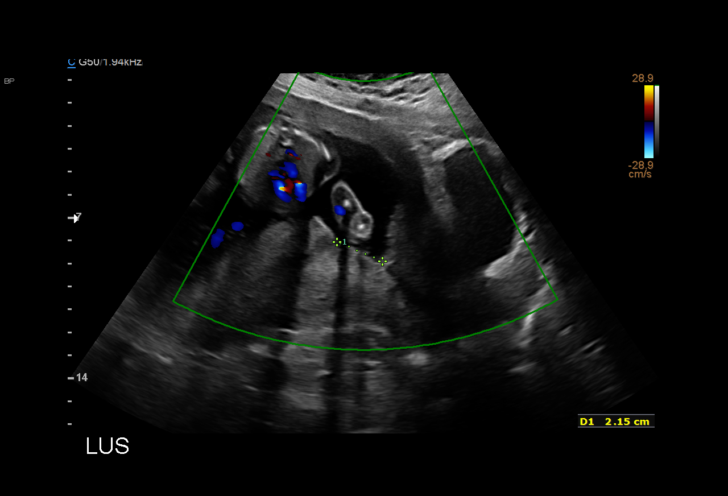
[im 13/117]
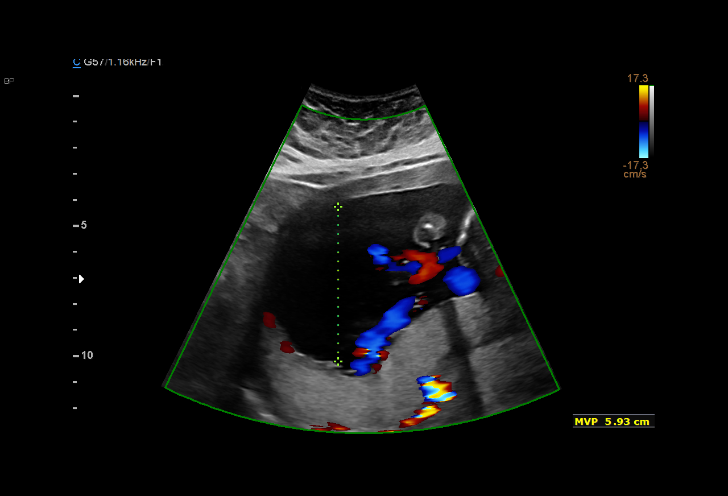
[im 22/117]
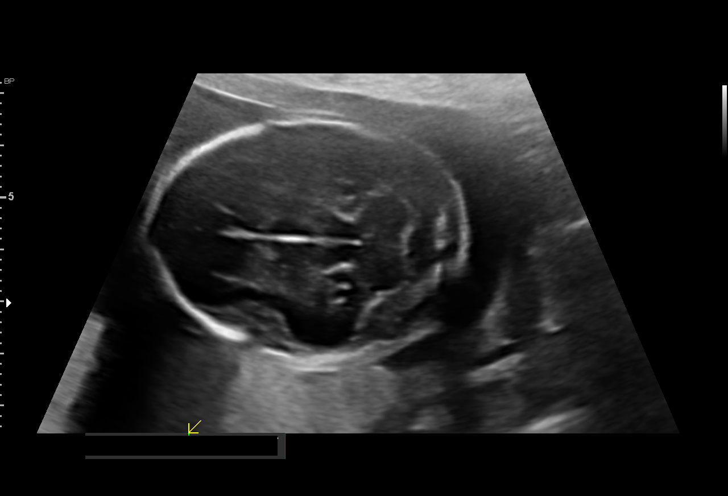
[im 31/117]
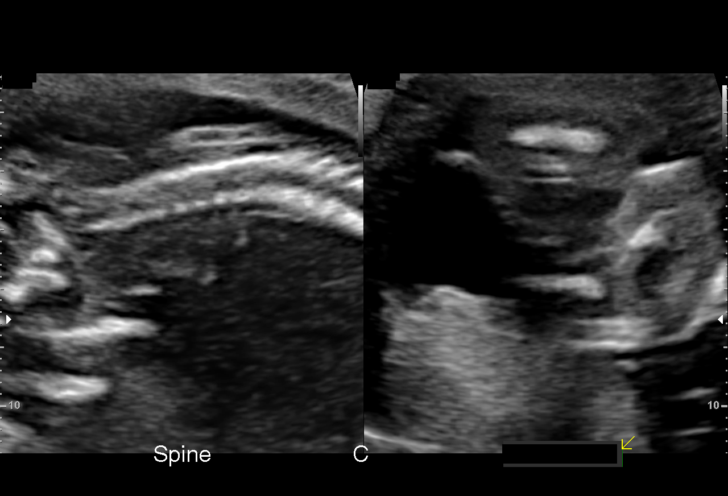
[im 39/117]
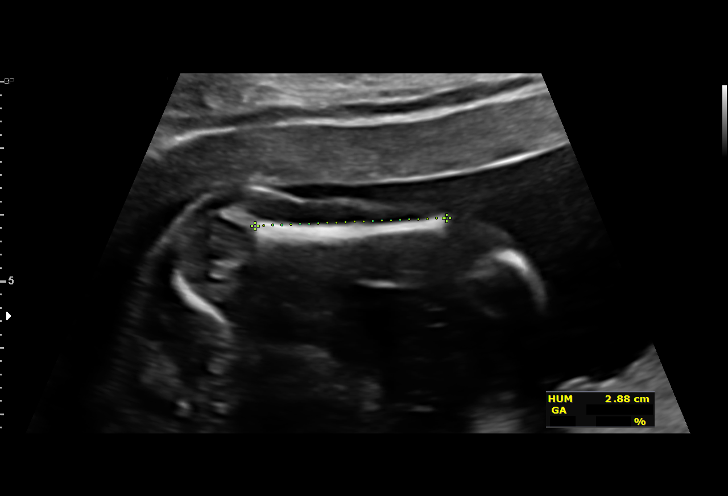
[im 48/117]
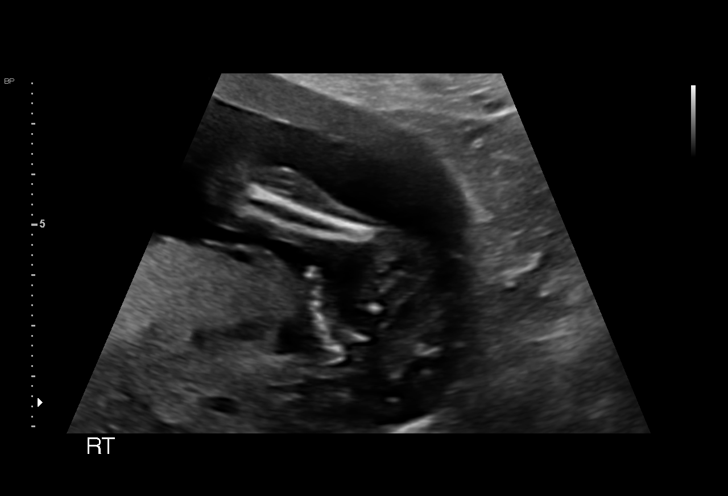
[im 61/117]
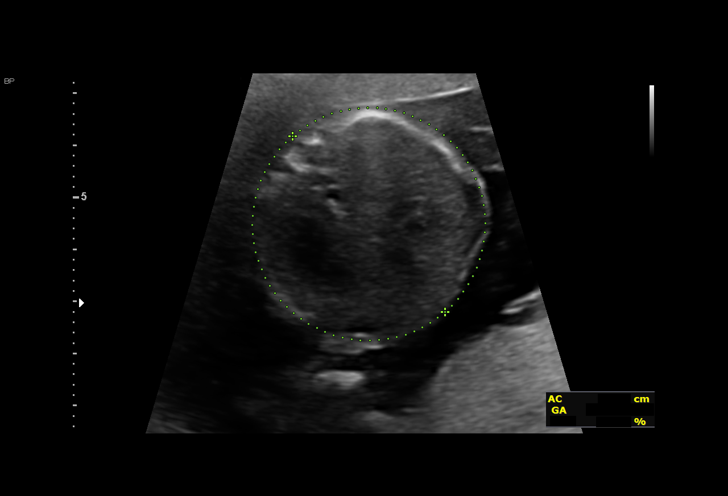
[im 69/117]
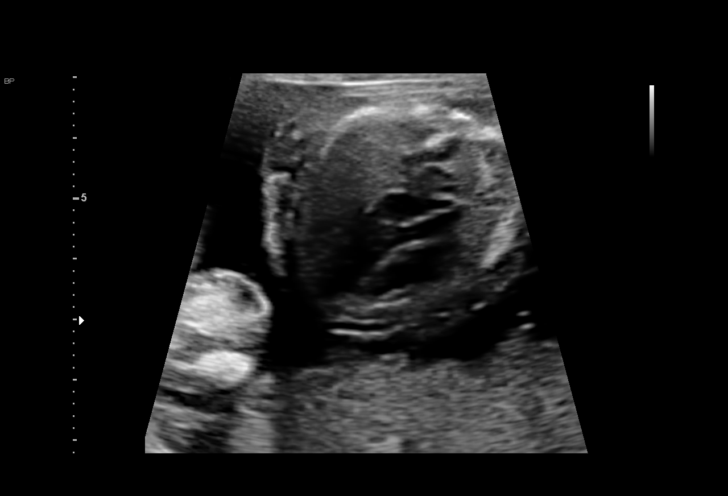
[im 78/117]
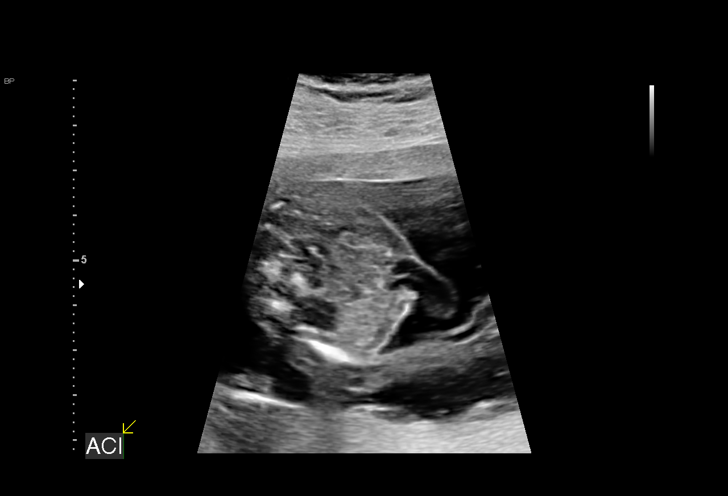
[im 86/117]
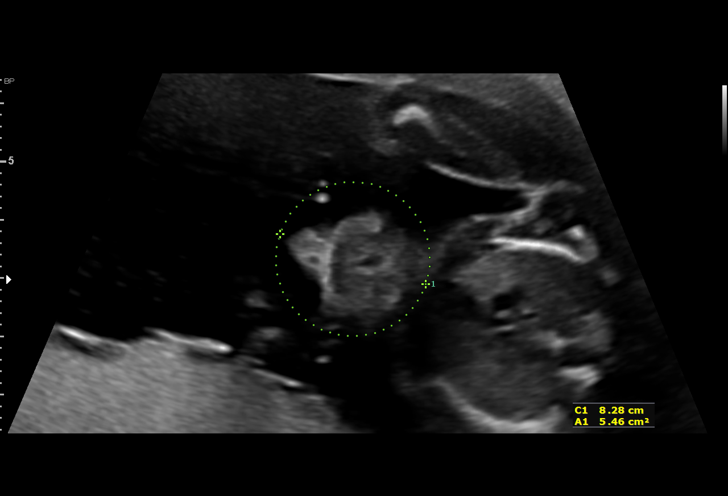
[im 95/117]
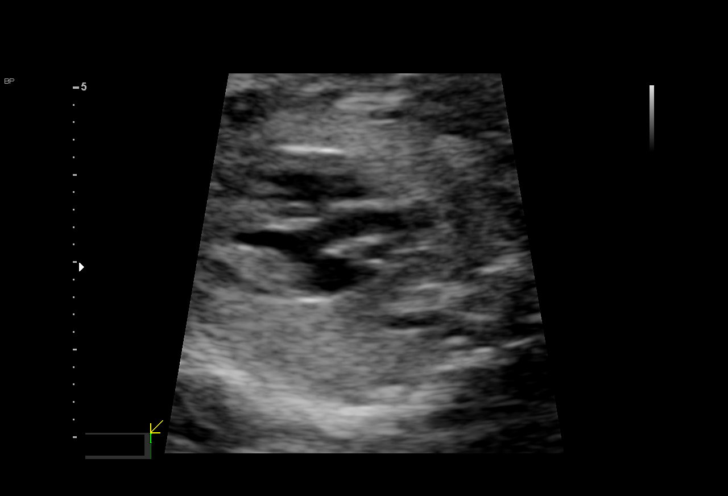
[im 104/117]
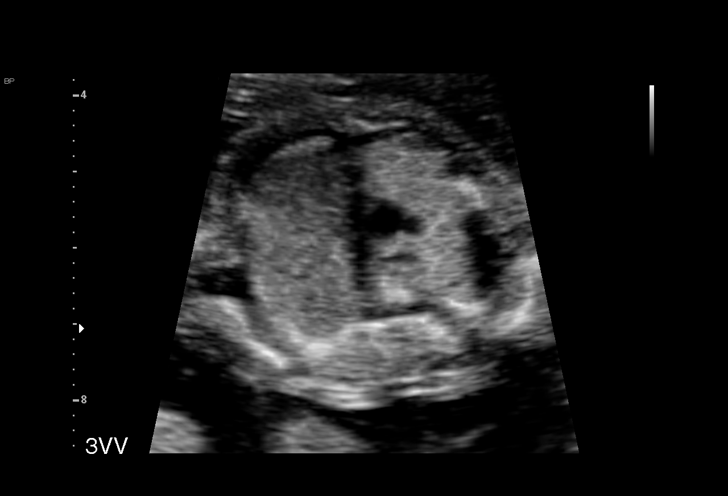
[im 112/117]
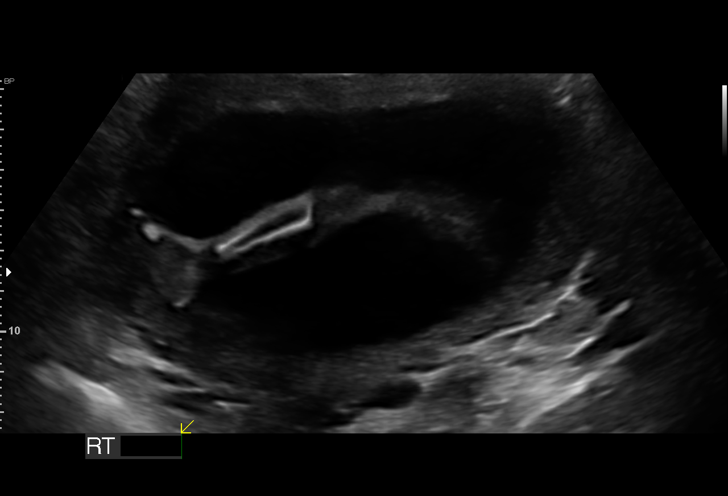

[13 of 28 positions shown; findings below may reference images not displayed]

Indications

 Personal history of congenital abnormality
 (s/p VSD repair)
 Obesity complicating pregnancy, second
 trimester (BMI 35)
 Encounter for antenatal screening for
 malformations
 18 weeks gestation of pregnancy
 LR NIPS
Fetal Evaluation

 Num Of Fetuses:         1
 Fetal Heart Rate(bpm):  143
 Cardiac Activity:       Observed
 Presentation:           Breech
 Placenta:               Posterior
 P. Cord Insertion:      Visualized, central

 Amniotic Fluid
 AFI FV:      Within normal limits

                             Largest Pocket(cm)

Biometry

 BPD:      46.1  mm     G. Age:  20w 0d         89  %    CI:        73.31   %    70 - 86
                                                         FL/HC:      16.7   %    16.1 -
 HC:      171.1  mm     G. Age:  19w 5d         81  %    HC/AC:      1.22        1.09 -
 AC:      140.7  mm     G. Age:  19w 3d         66  %    FL/BPD:     62.0   %
 FL:       28.6  mm     G. Age:  18w 5d         40  %    FL/AC:      20.3   %    20 - 24
 HUM:      28.6  mm     G. Age:  19w 2d         62  %
 CER:      19.3  mm     G. Age:  18w 6d         36  %
 NFT:       4.8  mm

 CM:        5.4  mm

 Est. FW:     280  gm    0 lb 10 oz      67  %
OB History

 Blood Type:   A+
 Gravidity:    4         Term:   3        Prem:   0        SAB:   0
 TOP:          0       Ectopic:  0        Living: 3
Gestational Age

 LMP:           20w 1d        Date:  02/20/21                 EDD:   11/27/21
 U/S Today:     19w 3d                                        EDD:   12/02/21
 Best:          18w 6d     Det. By:  U/S C R L  (04/19/21)    EDD:   12/06/21
Anatomy

 Cranium:               Appears normal         Aortic Arch:            Appears normal
 Cavum:                 Appears normal         Ductal Arch:            Not well visualized
 Ventricles:            Appears normal         Diaphragm:              Appears normal
 Choroid Plexus:        Appears normal         Stomach:                Appears normal, left
                                                                       sided
 Cerebellum:            Appears normal         Abdomen:                Appears normal
 Posterior Fossa:       Appears normal         Abdominal Wall:         Appears nml (cord
                                                                       insert, abd wall)
 Nuchal Fold:           Appears normal         Cord Vessels:           Not well visualized
 Face:                  Appears normal         Kidneys:                Appear normal
                        (orbits and profile)
 Lips:                  Appears normal         Bladder:                Appears normal
 Thoracic:              Appears normal         Spine:                  Limited views
                                                                       appear normal
 Heart:                 Appears normal         Upper Extremities:      Appears normal
                        (4CH, axis, and
                        situs)
 RVOT:                  Appears normal         Lower Extremities:      Appears normal
 LVOT:                  Appears normal

 Other:  Fetus appears to be a male. VC, 3VV and 3VTV visualized.
         Heels/feet and hands visualized. Technically difficult due to maternal
         habitus and fetal position.
Cervix Uterus Adnexa

 Cervix
 Normal appearance by transabdominal scan.

 Uterus
 Normal shape and size.

 Right Ovary
 Within normal limits.

 Left Ovary
 Within normal limits.
 Cul De Sac
 No free fluid seen.

 Adnexa
 No adnexal mass visualized.
Impression

 G4 P3. Patient is here for fetal anatomy scan. She had VSD
 that was repaired at 9 months of age.

 On cell-free fetal DNA screening, the risks of fetal
 aneuploidies are not increased .

 We performed fetal anatomy scan. No makers of
 aneuploidies or fetal structural defects are seen. Fetal
 biometry is consistent with her previously-established dates.
 Amniotic fluid is normal and good fetal activity is seen.
 Patient understands the limitations of ultrasound in detecting
 fetal anomalies.

Recommendations

 -An appointment was made for her to return in 4 weeks for
 completion of fetal anatomy.
 -Your office has schedule an appointment for fetal
 echocardiography.
                Hosan, Pak Agus

## 2023-09-22 ENCOUNTER — Ambulatory Visit: Payer: MEDICAID | Admitting: Obstetrics and Gynecology

## 2023-09-22 ENCOUNTER — Encounter: Payer: Self-pay | Admitting: Obstetrics and Gynecology

## 2023-09-22 ENCOUNTER — Other Ambulatory Visit (HOSPITAL_COMMUNITY)
Admission: RE | Admit: 2023-09-22 | Discharge: 2023-09-22 | Disposition: A | Discharge status: Home / Self Care | Payer: MEDICAID | Source: Ambulatory Visit | Attending: Obstetrics and Gynecology | Admitting: Obstetrics and Gynecology

## 2023-09-22 VITALS — BP 103/72 | HR 102 | Wt 188.8 lb

## 2023-09-22 DIAGNOSIS — O99341 Other mental disorders complicating pregnancy, first trimester: Secondary | ICD-10-CM

## 2023-09-22 DIAGNOSIS — Z3481 Encounter for supervision of other normal pregnancy, first trimester: Secondary | ICD-10-CM

## 2023-09-22 DIAGNOSIS — O09521 Supervision of elderly multigravida, first trimester: Secondary | ICD-10-CM | POA: Diagnosis not present

## 2023-09-22 DIAGNOSIS — Z3480 Encounter for supervision of other normal pregnancy, unspecified trimester: Secondary | ICD-10-CM | POA: Insufficient documentation

## 2023-09-22 DIAGNOSIS — Z3A12 12 weeks gestation of pregnancy: Secondary | ICD-10-CM | POA: Diagnosis not present

## 2023-09-22 DIAGNOSIS — Z8774 Personal history of (corrected) congenital malformations of heart and circulatory system: Secondary | ICD-10-CM | POA: Diagnosis not present

## 2023-09-22 DIAGNOSIS — F32A Depression, unspecified: Secondary | ICD-10-CM

## 2023-09-22 DIAGNOSIS — Z3009 Encounter for other general counseling and advice on contraception: Secondary | ICD-10-CM | POA: Insufficient documentation

## 2023-09-22 DIAGNOSIS — Z6834 Body mass index (BMI) 34.0-34.9, adult: Secondary | ICD-10-CM

## 2023-09-22 MED ORDER — ASPIRIN 81 MG PO TBEC: 81.0000 mg | DELAYED_RELEASE_TABLET | Freq: Every day | ORAL | 12 refills | Status: DC

## 2023-09-22 NOTE — Progress Notes (Signed)
 Subjective:   [name] is a 38 y.o. [id number] at [redacted]w[redacted]d by LMP c/w 10 wk US  being seen today for her first obstetrical visit.  Her obstetrical history is significant for advanced maternal age and BMI 59. Patient does intend to breast feed. Pregnancy history fully reviewed. Uncomplicated SVD x4.  Patient reports no complaints.  HISTORY: OB History  Gravida Para Term Preterm AB Living  5 4 4  0 0 4  SAB IAB Ectopic Multiple Live Births  0 0 0 0 4    # Outcome Date GA Lbr Len/2nd Weight Sex Type Anes PTL Lv  5 Current           4 Term 11/17/21 [redacted]w[redacted]d 01:42 7 lb 8.3 oz (3.41 kg) M Vag-Spont None  LIV     Name: Hausler,BOY Raeonna     Apgar1: 9  Apgar5: 9  3 Term 09/27/20 [redacted]w[redacted]d 05:23 / 00:21 8 lb 15.7 oz (4.074 kg) M Vag-Spont None  LIV     Birth Comments: WNL     Name: Leath,BOY Candance     Apgar1: 8  Apgar5: 9  2 Term 12/05/12 [redacted]w[redacted]d 04:35 / 00:06 8 lb 2 oz (3.685 kg) M Vag-Spont None  LIV     Birth Comments: none     Name: Safley,BOY Trana     Apgar1: 9  Apgar5: 9  1 Term 06/05/06 [redacted]w[redacted]d  7 lb 1 oz (3.204 kg) M Vag-Spont None  LIV    Last pap smear: Lab Results  Component Value Date   DIAGPAP  05/21/2021    - Negative for intraepithelial lesion or malignancy (NILM)   HPV NOT DETECTED 06/10/2018   HPVHIGH Negative 05/21/2021    Past Medical History:  Diagnosis Date   Depression    GERD (gastroesophageal reflux disease)    Hx of percutaneous transcatheter closure of congenital VSD    Migraine headache with aura    Panic attack    Past Surgical History:  Procedure Laterality Date   CARDIAC SURGERY     as a baby   TONSILLECTOMY     Family History  Problem Relation Age of Onset   Healthy Mother    Hypertension Father    Diabetes Father    Cancer Paternal Grandmother        breast   Stroke Paternal Grandfather    Asthma Paternal Aunt    Cancer Paternal Uncle        colon cancer   Congestive Heart Failure Paternal Uncle    Asthma Cousin    Congestive Heart  Failure Other    Heart disease Neg Hx    Social History   Tobacco Use   Smoking status: Former    Current packs/day: 0.00    Types: Cigarettes    Quit date: 08/16/2010    Years since quitting: 13.1   Smokeless tobacco: Never  Vaping Use   Vaping status: Never Used  Substance Use Topics   Alcohol use: Not Currently    Comment: last drink drink early September   Drug use: No   No Known Allergies Current Outpatient Medications on File Prior to Visit  Medication Sig Dispense Refill   Prenatal Vit-Fe Fumarate-FA (PRENATAL MULTIVITAMIN) TABS tablet Take 1 tablet by mouth daily at 12 noon.     No current facility-administered medications on file prior to visit.   Exam   Vitals:   09/22/23 0949  BP: 103/72  Pulse: (!) 102  Weight: 188 lb 12.8 oz (85.6  kg)   Fetal Heart Rate (bpm): 164  General:  Alert, oriented and cooperative. Patient is in no acute distress.  Breast: Deferred  Cardiovascular: Normal heart rate noted  Respiratory: Normal respiratory effort, no problems with respiration noted  Abdomen: Soft, non tender  Pelvic: Not indicated        Extremities: Normal range of motion.  Edema: None  Mental Status: Normal mood and affect. Normal behavior. Normal judgment and thought content.    Assessment:   Pregnancy: [id number] Patient Active Problem List   Diagnosis Date Noted   Consultation for female sterilization 09/22/2023   Supervision of other normal pregnancy, antepartum 09/08/2023   Advanced maternal age in multigravida 09/08/2023   Anxiety and depression 05/21/2021   S/P VSD repair    Plan:  Supervision of other normal pregnancy, antepartum 12 weeks Initial labs drawn. Continue prenatal vitamins. Genetic Screening discussed: NIPS ordered, carrier screening previously completed Ultrasound discussed; fetal anatomic survey: scheduled 8/6 Problem list reviewed and updated. The nature of [facility] with multiple MDs and  other Advanced Practice Providers was explained to patient; also emphasized that residents, students are part of our team. Routine obstetric precautions reviewed. -     CBC/D/Plt+RPR+Rh+ABO+RubIgG... -     Culture, OB Urine -     Cervicovaginal ancillary only -     Hemoglobin A1c -     PANORAMA PRENATAL TEST  S/P VSD repair No functional limitations or symptoms Reports last echo in teenage years normal Plan for fetal echo  Multigravida of advanced maternal age in first trimester BMI 34.0-34.9,adult NIPS today, detailed anatomy -     aspirin  EC 81 MG tablet; Take 1 tablet (81 mg total) by mouth daily. Swallow whole.  Consultation for female sterilization Reports she has completed childbearing and would like postpartum tubal ligation. Reviewed procedure and alternatives, risk of regret as procedure is irreversible. She is sure of her decision for tubal.  Will sign papers and review in detail later in pregnancy  Anxiety and depression No mood concerns today  Return in about 4 weeks (around 10/20/2023) for return OB at 16 weeks.  Loralyn Rochester, MD Obstetrician & Gynecologist, [facility] for [employer], [facility]

## 2023-09-22 NOTE — Progress Notes (Signed)
 ROB: denies any concerns

## 2023-09-23 ENCOUNTER — Ambulatory Visit: Payer: Self-pay | Admitting: Obstetrics and Gynecology

## 2023-09-23 DIAGNOSIS — Z3480 Encounter for supervision of other normal pregnancy, unspecified trimester: Secondary | ICD-10-CM

## 2023-09-23 LAB — CBC/D/PLT+RPR+RH+ABO+RUBIGG...
Antibody Screen: NEGATIVE
Basophils Absolute: 0 10*3/uL (ref 0.0–0.2)
Basos: 0 %
EOS (ABSOLUTE): 0.1 10*3/uL (ref 0.0–0.4)
Eos: 2 %
HCV Ab: NONREACTIVE
HIV Screen 4th Generation wRfx: NONREACTIVE
Hematocrit: 38 % (ref 34.0–46.6)
Hemoglobin: 12.6 g/dL (ref 11.1–15.9)
Hepatitis B Surface Ag: NEGATIVE
Immature Grans (Abs): 0 10*3/uL (ref 0.0–0.1)
Immature Granulocytes: 0 %
Lymphocytes Absolute: 1.3 10*3/uL (ref 0.7–3.1)
Lymphs: 22 %
MCH: 28.4 pg (ref 26.6–33.0)
MCHC: 33.2 g/dL (ref 31.5–35.7)
MCV: 86 fL (ref 79–97)
Monocytes Absolute: 0.4 10*3/uL (ref 0.1–0.9)
Monocytes: 7 %
Neutrophils Absolute: 4.1 10*3/uL (ref 1.4–7.0)
Neutrophils: 68 %
Platelets: 292 10*3/uL (ref 150–450)
RBC: 4.43 x10E6/uL (ref 3.77–5.28)
RDW: 12.9 % (ref 11.7–15.4)
RPR Ser Ql: NONREACTIVE
Rh Factor: POSITIVE
Rubella Antibodies, IGG: 1.28 {index} (ref >0.99)
WBC: 6 10*3/uL (ref 3.4–10.8)

## 2023-09-23 LAB — CERVICOVAGINAL ANCILLARY ONLY
Chlamydia: NEGATIVE
Comment: NEGATIVE
Comment: NORMAL
Neisseria Gonorrhea: NEGATIVE

## 2023-09-23 LAB — HCV INTERPRETATION

## 2023-09-23 LAB — HEMOGLOBIN A1C
Est. average glucose Bld gHb Est-mCnc: 103 mg/dL
Hgb A1c MFr Bld: 5.2 % (ref 4.8–5.6)

## 2023-09-23 MED ORDER — MAGNESIUM OXIDE -MG SUPPLEMENT 400 (240 MG) MG PO TABS: 400.0000 mg | ORAL_TABLET | Freq: Every day | ORAL | 11 refills | Status: DC

## 2023-09-24 LAB — URINE CULTURE, OB REFLEX

## 2023-09-24 LAB — CULTURE, OB URINE

## 2023-09-28 LAB — PANORAMA PRENATAL TEST FULL PANEL:PANORAMA TEST PLUS 5 ADDITIONAL MICRODELETIONS: FETAL FRACTION: 5.5

## 2023-10-20 ENCOUNTER — Ambulatory Visit (INDEPENDENT_AMBULATORY_CARE_PROVIDER_SITE_OTHER): Payer: MEDICAID | Admitting: Obstetrics & Gynecology

## 2023-10-20 VITALS — BP 116/80 | HR 97 | Wt 191.0 lb

## 2023-10-20 DIAGNOSIS — Z8774 Personal history of (corrected) congenital malformations of heart and circulatory system: Secondary | ICD-10-CM

## 2023-10-20 DIAGNOSIS — Z3482 Encounter for supervision of other normal pregnancy, second trimester: Secondary | ICD-10-CM

## 2023-10-20 DIAGNOSIS — Z3A16 16 weeks gestation of pregnancy: Secondary | ICD-10-CM | POA: Diagnosis not present

## 2023-10-20 DIAGNOSIS — Z3480 Encounter for supervision of other normal pregnancy, unspecified trimester: Secondary | ICD-10-CM

## 2023-10-20 NOTE — Progress Notes (Signed)
   PRENATAL VISIT NOTE  Subjective:  [name] is a 38 y.o. [id number] at [redacted]w[redacted]d being seen today for ongoing prenatal care.  She is currently monitored for the following issues for this low-risk pregnancy and has S/P VSD repair; Anxiety and depression; Supervision of other normal pregnancy, antepartum; Advanced maternal age in multigravida; and Consultation for female sterilization on their problem list.  Patient reports no complaints.  Contractions: Not present. Vag. Bleeding: None.   . Denies leaking of fluid.   The following portions of the patient's history were reviewed and updated as appropriate: allergies, current medications, past family history, past medical history, past social history, past surgical history and problem list.   Objective:    Vitals:   10/20/23 1614  BP: 116/80  Pulse: 97  Weight: 191 lb (86.6 kg)    Fetal Status:  Fetal Heart Rate (bpm): 150        General: Alert, oriented and cooperative. Patient is in no acute distress.  Skin: Skin is warm and dry. No rash noted.   Cardiovascular: Normal heart rate noted  Respiratory: Normal respiratory effort, no problems with respiration noted  Abdomen: Soft, gravid, appropriate for gestational age.  Pain/Pressure: Absent     Pelvic: Cervical exam deferred        Extremities: Normal range of motion.     Mental Status: Normal mood and affect. Normal behavior. Normal judgment and thought content.   Assessment and Plan:  Pregnancy: G5P4004 at [redacted]w[redacted]d 1. S/P VSD repair Repaired as a child, no issues - Ambulatory referral to Pediatric Cardiology for fetal ECHO.  2. [redacted] weeks gestation of pregnancy 3. Supervision of other normal pregnancy, antepartum (Primary) - AFP, Serum, Open Spina Bifida done today - Already scheduled for anatomy scan  No other complaints or concerns.  Routine obstetric precautions reviewed.  Please refer to After Visit Summary for other counseling recommendations.   Return in about 4 weeks  (around 11/17/2023) for OFFICE OB VISIT (MD or APP).  Future Appointments  Date Time Provider Department Center  11/11/2023 12:45 PM [facility] CONSULT RM [facility] None  11/11/2023  1:00 PM [facility] US1 [facility]  11/11/2023  2:30 PM [facility] GENETIC RM [facility] None  11/17/2023 10:55 AM Izell Harari, MD [facility]    Gloris Hugger, MD

## 2023-10-22 ENCOUNTER — Ambulatory Visit: Payer: Self-pay | Admitting: Obstetrics & Gynecology

## 2023-10-22 DIAGNOSIS — Z3480 Encounter for supervision of other normal pregnancy, unspecified trimester: Secondary | ICD-10-CM

## 2023-10-22 LAB — AFP, SERUM, OPEN SPINA BIFIDA
AFP MoM: 0.71
AFP Value: 20.8 ng/mL
Gest. Age on Collection Date: 16.2 wk
Maternal Age At EDD: 37.1 a
OSBR Risk 1 IN: 10000
Test Results:: NEGATIVE
Weight: 191 [lb_av]

## 2023-11-11 ENCOUNTER — Ambulatory Visit: Payer: MEDICAID | Admitting: Obstetrics and Gynecology

## 2023-11-11 ENCOUNTER — Ambulatory Visit: Payer: MEDICAID

## 2023-11-11 ENCOUNTER — Other Ambulatory Visit: Payer: MEDICAID

## 2023-11-11 ENCOUNTER — Ambulatory Visit: Payer: MEDICAID | Attending: Obstetrics and Gynecology

## 2023-11-11 ENCOUNTER — Ambulatory Visit: Payer: Self-pay | Admitting: Obstetrics and Gynecology

## 2023-11-11 ENCOUNTER — Other Ambulatory Visit: Payer: Self-pay

## 2023-11-11 VITALS — BP 131/75 | HR 101

## 2023-11-11 DIAGNOSIS — Z8774 Personal history of (corrected) congenital malformations of heart and circulatory system: Secondary | ICD-10-CM | POA: Diagnosis not present

## 2023-11-11 DIAGNOSIS — E6689 Other obesity not elsewhere classified: Secondary | ICD-10-CM

## 2023-11-11 DIAGNOSIS — O09522 Supervision of elderly multigravida, second trimester: Secondary | ICD-10-CM

## 2023-11-11 DIAGNOSIS — O99212 Obesity complicating pregnancy, second trimester: Secondary | ICD-10-CM | POA: Diagnosis not present

## 2023-11-11 DIAGNOSIS — O2690 Pregnancy related conditions, unspecified, unspecified trimester: Secondary | ICD-10-CM | POA: Diagnosis not present

## 2023-11-11 DIAGNOSIS — Z3480 Encounter for supervision of other normal pregnancy, unspecified trimester: Secondary | ICD-10-CM

## 2023-11-11 DIAGNOSIS — E669 Obesity, unspecified: Secondary | ICD-10-CM | POA: Diagnosis not present

## 2023-11-11 DIAGNOSIS — Z3A19 19 weeks gestation of pregnancy: Secondary | ICD-10-CM | POA: Insufficient documentation

## 2023-11-11 DIAGNOSIS — O09529 Supervision of elderly multigravida, unspecified trimester: Secondary | ICD-10-CM

## 2023-11-11 DIAGNOSIS — Z8279 Family history of other congenital malformations, deformations and chromosomal abnormalities: Secondary | ICD-10-CM | POA: Diagnosis not present

## 2023-11-11 DIAGNOSIS — Z363 Encounter for antenatal screening for malformations: Secondary | ICD-10-CM | POA: Diagnosis not present

## 2023-11-11 DIAGNOSIS — Z148 Genetic carrier of other disease: Secondary | ICD-10-CM | POA: Insufficient documentation

## 2023-11-11 NOTE — Progress Notes (Signed)
 Genetic Counseling Note for In Person Visit  Referring Provider:  Vincente Saber Length of Consultation: 40  minutes  Ms. 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  The patient will be 38 years old at the time of delivery.  This note summarizes the information we discussed.    Advanced Maternal Age: We explained that the chance of a chromosome abnormality increases with maternal age.  Chromosomes and examples of chromosome problems were reviewed.  Humans typically have 46 chromosomes in each cell, with half passed through each sperm and egg.  Any change in the number or structure of chromosomes can increase the risk of problems in the physical and mental development of a pregnancy.   Based upon age of the patient and the current gestational age, the chance of any chromosome abnormality was 1 in 70. The chance of Down syndrome, the most common chromosome problem associated with maternal age, was 1 in 55.  The risk of chromosome problems is in addition to the 3% general population risk for birth defects and intellectual disabilities.  The greatest chance, of course, is that the baby would be born in good health.  We discussed the following prenatal screening and testing options for this pregnancy:  Cell free fetal DNA testing analyzes maternal blood to determine whether or not the baby may have Down syndrome, trisomy 37, or trisomy 33.  This test utilizes a maternal blood sample and DNA sequencing technology to isolate circulating cell free fetal DNA from maternal plasma.  The fetal DNA can then be analyzed for DNA sequences that are derived from the three most common chromosomes involved in aneuploidy, chromosomes 13, 18, and 21.  If the overall amount of DNA is greater than the expected level for any of these chromosomes,  aneuploidy is suspected.  While we do not consider it a replacement for invasive testing and karyotype analysis, a negative result from this testing would be reassuring, though not a guarantee of a normal chromosome complement for the baby.  An abnormal result is certainly suggestive of an abnormal chromosome complement, though we would still recommend CVS or amniocentesis to confirm any findings from this testing.  Ms. Sullenger had prior cell free fetal DNA testing in this pregnancy which was negative for Trisomy 81, 18 and 21 as well as triploidy, monosomy X and 22q deletion syndrome.  Results revealed the fetal gender to be female.  This result significantly reduces the chance for these conditions in the pregnancy, but is still considered a screening test.   Maternal serum AFP testing evaluates a maternal blood sample for the level of AFP, a protein made by the fetus, to assess the chance for an open neural tube defect in the pregnancy.  These results were low risk, showing a chance of 1 in 10,000 for open spina bifida.  Targeted ultrasound uses high frequency sound waves to create an image of the developing fetus.  An ultrasound is often recommended as a routine means of evaluating the pregnancy.  It is also used to screen for fetal anatomy problems (for example, a heart defect) that might be suggestive of a chromosomal or other abnormality. Ultrasound at the time of this visit showed normal fetal anatomy with no markers suggestive of aneuploidy.  See the ultrasound report for details.  Amniocentesis involves the removal of a small amount  of amniotic fluid from the sac surrounding the fetus with the use of a thin needle inserted through the maternal abdomen and uterus.  Ultrasound guidance is used throughout the procedure.  Fetal cells from amniotic fluid are directly evaluated and > 99.5% of chromosome problems and > 98% of open neural tube defects can be detected. This procedure is generally performed after  the 15th week of pregnancy.  The main risks to this procedure include complications leading to miscarriage in less than 1 in 200 cases (0.5%).  Maternal Intermediate Carrier for Fragile X Syndrome: Through [employer] 27 carrier screening, Laylani was found to carry an intermediate size 47 CGG allele and a normal size 30 CGG allele in her FMR1 genes.   Based on these results, she is not at an increased risk of having a child with fragile X syndrome. In some cases, intermediate size repeat alleles can expand to a premutation in future generations. It is estimated that around 14% of intermediate alleles expand to a premutation allele in future generations.  Individuals who are intermediate allele carriers are not expected to have any additional medical concerns regarding this result. Intermediate sized alleles are typically between 45 and 54 CGG repeats. These individuals have a chance that each of their children may inherit a slightly larger CGG repeat allele. It is possible for intermediate allele carriers to have children who are premutation carriers. Premutation carriers have between 55 and 200 CGG repeats. Females who are premutation carriers are at risk of having children with fragile X syndrome. Female premutation allele carriers may exhibit symptoms of fragile X premature ovarian insufficiency (FXPOI), which include early menopause and infertility. Males and females with the premutation alleles may have attention deficit disorder (ADD) or other concerns involving attention, behavior, and social skills. They are also at risk of developing a neurologic condition called fragile X associated tremor/ataxia syndrome (FXTAS) in late adulthood. Therefore, Ms. Skilton children may seek genetic counseling in the future to determine risk of fragile X syndrome to any of their pregnancies. We discussed that regarding this current pregnancy, there is a 50% chance that Rejoice would pass down the intermediate allele.  According to current data, there would be an approximately 7% chance that this pregnancy would have the premutation allele.    Finally, of the other 26 conditions screened for, Ardythe was not found to be a carrier. A negative result significantly reduces but does not eliminate the chance that the individual is a carrier. Please see report for details.  Family history: We obtained a detailed family history and pregnancy history.  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  She has no other health or developmental concerns to suggest an underlying genetic syndrome as the cause.  In the absence of a genetic syndrome, most structural heart defects are thought to be multifactorial and would be expected to have a 5% recurrence risk in her children.  Emmaclaire stated that her OB had sent a referral for a fetal echocardiogram in this pregnancy, but that it has not yet been scheduled.  We let her know that we are happy to assist in scheduling that if needed.  Also in the family history, the father of the baby has a paternal first cousin with Down syndrome.  Down syndrome is caused by an extra copy of the genetic instructions located on chromosome number 21.  These extra instructions result in the characteristic facial appearance, intellectual disabilities and an increased risk for some types  of birth defects.  The majority (95%) of persons with Down syndrome have three freestanding copies of chromosome number 21, called trisomy 40.  This type of Down syndrome occurs as a sporadic condition and does not increase the chance for other family members to have Down syndrome.  Rarely, Down syndrome is caused by a rearrangement of the genetic instructions, where the extra chromosome number 21 is attached to another chromosome.  This type of chromosome rearrangement can be passed down through families and therefore may increase the chance for Down syndrome in other family members.  We cannot determine  which type of Down syndrome is present without documentation of chromosome studies in the affected family member.  If any additional information is obtained about this history, please do not hesitate to contact us . Jestina also reported a paternal uncle with colon cancer in her 64s and a grandmother with breast cancer around 73 years old. We explained that most common cancers occur by chance, but if the family is concerned about possible familial risks, they could meet with a cancer genetic counselor. The remainder of the family history is unremarkable for birth defects, developmental delays, recurrent pregnancy loss or known chromosome abnormalities.  Ms. Flagler stated that this is her fifth pregnancy.  She reported no complications or exposure to medications, alcohol, tobacco or recreational drugs.  Ms. Mcsorley was encouraged to call with questions or concerns.  We can be contacted at [phone].  Plan of Care: We recommend a fetal echocardiogram after [redacted] weeks gestation. The patient will confirm with her OB that this referral has been placed and let us  know if we can help. Scheduled to return to clinic on 12/16/23 for follow up ultrasound Virgia declined amniocentesis given normal NIPS and ultrasound.  Barnie PHEBE Dixons, MS, CGC   In total, 70 minutes were spent on the date of the encounter in service to the patient including preparation, record review, face-to-face consultation, documentation and care coordination.

## 2023-11-11 NOTE — Progress Notes (Signed)
 Maternal-Fetal Medicine   Name: [name] MRN: [id number] G5 P4004 at 19w 3d gestation. Patient is here for fetal anatomy scan. On cell-free fetal DNA screening, the risks of aneuploidies are not increased. MSAFP screening showed low risk for open-neural tube defects. On carrier screening, intermediate allele size for fragile X syndrome (47/30) was detected. None of her children (all female children) has fragile-X syndrome.  Ultrasound We performed fetal anatomy scan. No makers of aneuploidies or fetal structural defects are seen. Fetal biometry is consistent with her previously-established dates. Amniotic fluid is normal and good fetal activity is seen. Patient understands the limitations of ultrasound in detecting fetal anomalies.  As maternal obesity imposes limitations on the resolution of images, fetal anomalies may be missed.  Mother with congenital heart defect Patient had ventricular septal defect that was repaired in childhood.  She has not had cardiovascular problems in any of her pregnancies.  All her children do not have congenital heart defects. I counseled the patient that there is a 5% chance of the fetus having congenital heart defect.  On today's ultrasound, cardiac evaluation was incomplete.  Patient has an appointment for fetal echocardiography.  Advanced maternal age I counseled the patient that AMA is associated with increased risk of chromosomal anomalies.  I discussed the significance and limitations of cell free fetal DNA screening that has a greater detection rate for Down syndrome.  Patient had low risk for Down syndrome on cell free fetal DNA screening. I counseled the patient that only amniocentesis will give a definitive result on all the chromosomes.  I explained the procedure and possible complication of miscarriage (1 and 500 procedures).  Patient opted not have amniocentesis.  After ultrasound, the couple met with our genetic counselor.  You will be receiving a  separate letter from our genetic counselor. Recommendations - An appointment was made for her to return in 5 weeks for completion of fetal anatomy. - Fetal growth assessment at [redacted] weeks gestation. - Your office made an appointment for fetal echocardiography.  Patient prefers fetal echocardiography at [facility].  Consultation including face-to-face counseling (more than 50% of time spent) is 30 minutes.

## 2023-11-17 ENCOUNTER — Ambulatory Visit: Payer: MEDICAID | Admitting: Obstetrics and Gynecology

## 2023-11-17 ENCOUNTER — Encounter: Payer: Self-pay | Admitting: Obstetrics and Gynecology

## 2023-11-17 VITALS — BP 128/83 | HR 102 | Wt 194.0 lb

## 2023-11-17 DIAGNOSIS — Z8774 Personal history of (corrected) congenital malformations of heart and circulatory system: Secondary | ICD-10-CM | POA: Diagnosis not present

## 2023-11-17 DIAGNOSIS — O09522 Supervision of elderly multigravida, second trimester: Secondary | ICD-10-CM | POA: Diagnosis not present

## 2023-11-17 DIAGNOSIS — Z3A20 20 weeks gestation of pregnancy: Secondary | ICD-10-CM | POA: Diagnosis not present

## 2023-11-17 DIAGNOSIS — Z3480 Encounter for supervision of other normal pregnancy, unspecified trimester: Secondary | ICD-10-CM

## 2023-11-17 NOTE — Progress Notes (Signed)
 ROB   CC: None

## 2023-11-17 NOTE — Progress Notes (Signed)
   PRENATAL VISIT NOTE  Subjective:  [name] is a 38 y.o. H4E5995 at [redacted]w[redacted]d being seen today for ongoing prenatal care.  She is currently monitored for the following issues for this low-risk pregnancy and has S/P VSD repair; Anxiety and depression; Supervision of other normal pregnancy, antepartum; Advanced maternal age in multigravida; and Consultation for female sterilization on their problem list.  Patient reports no complaints.  Contractions: Not present.  .  Movement: Present. Denies leaking of fluid.   The following portions of the patient's history were reviewed and updated as appropriate: allergies, current medications, past family history, past medical history, past social history, past surgical history and problem list.   Objective:    Vitals:   11/17/23 1057  BP: 128/83  Pulse: (!) 102  Weight: 194 lb (88 kg)    Fetal Status:  Fetal Heart Rate (bpm): 156   Movement: Present    General: Alert, oriented and cooperative. Patient is in no acute distress.  Skin: Skin is warm and dry. No rash noted.   Cardiovascular: Normal heart rate noted  Respiratory: Normal respiratory effort, no problems with respiration noted  Abdomen: Soft, gravid, appropriate for gestational age.  Pain/Pressure: Present     Pelvic: Cervical exam deferred        Extremities: Normal range of motion.  Edema: None  Mental Status: Normal mood and affect. Normal behavior. Normal judgment and thought content.   Assessment and Plan:  Pregnancy: G5P4004 at [redacted]w[redacted]d 1. Supervision of other normal pregnancy, antepartum (Primary) Mfm anatomy u/s negative. Pt consider BTL. D/w her re: BTL and can f/u at subsequent visits  2. Multigravida of advanced maternal age in second trimester  3. S/P VSD repair Will ask front desk re: fetal echo referral status from 7/15  Preterm labor symptoms and general obstetric precautions including but not limited to vaginal bleeding, contractions, leaking of fluid and fetal  movement were reviewed in detail with the patient. Please refer to After Visit Summary for other counseling recommendations.   Return in about 1 month (around 12/18/2023) for in person, low risk ob, md or app.  Future Appointments  Date Time Provider Department Center  12/15/2023 10:35 AM Fredirick Glenys RAMAN, MD [facility]  12/16/2023  2:00 PM [facility]    Bebe Furry, MD

## 2023-12-15 ENCOUNTER — Ambulatory Visit: Payer: MEDICAID | Admitting: Family Medicine

## 2023-12-15 VITALS — BP 114/76 | HR 93 | Wt 199.0 lb

## 2023-12-15 DIAGNOSIS — Z3A24 24 weeks gestation of pregnancy: Secondary | ICD-10-CM | POA: Diagnosis not present

## 2023-12-15 DIAGNOSIS — Z8774 Personal history of (corrected) congenital malformations of heart and circulatory system: Secondary | ICD-10-CM

## 2023-12-15 DIAGNOSIS — O09522 Supervision of elderly multigravida, second trimester: Secondary | ICD-10-CM

## 2023-12-15 DIAGNOSIS — Z3480 Encounter for supervision of other normal pregnancy, unspecified trimester: Secondary | ICD-10-CM

## 2023-12-15 NOTE — Progress Notes (Signed)
    PRENATAL VISIT NOTE  Subjective:  [name] is a 38 y.o. H4E5995 at [redacted]w[redacted]d being seen today for ongoing prenatal care.  She is currently monitored for the following issues for this high-risk pregnancy and has S/P VSD repair; Anxiety and depression; Supervision of other normal pregnancy, antepartum; Advanced maternal age in multigravida; and Consultation for female sterilization on their problem list.  Patient reports no complaints.  Contractions: Irritability. Vag. Bleeding: None.  Movement: Present. Denies leaking of fluid.   The following portions of the patient's history were reviewed and updated as appropriate: allergies, current medications, past family history, past medical history, past social history, past surgical history and problem list.   Objective:    Vitals:   12/15/23 1100  BP: 114/76  Pulse: 93  Weight: 199 lb (90.3 kg)    Fetal Status:  Fetal Heart Rate (bpm): 145 Fundal Height: 25 cm Movement: Present    General: Alert, oriented and cooperative. Patient is in no acute distress.  Skin: Skin is warm and dry. No rash noted.   Cardiovascular: Normal heart rate noted  Respiratory: Normal respiratory effort, no problems with respiration noted  Abdomen: Soft, gravid, appropriate for gestational age.  Pain/Pressure: Absent     Pelvic: Cervical exam deferred        Extremities: Normal range of motion.  Edema: None  Mental Status: Normal mood and affect. Normal behavior. Normal judgment and thought content.   Assessment and Plan:  Pregnancy: G5P4004 at [redacted]w[redacted]d 1. Supervision of other normal pregnancy, antepartum (Primary) Continue prenatal care.  2. Multigravida of advanced maternal age in second trimester LR NIPT  3. S/P VSD repair Normal fetal echo  4. [redacted] weeks gestation of pregnancy   Preterm labor symptoms and general obstetric precautions including but not limited to vaginal bleeding, contractions, leaking of fluid and fetal movement were reviewed in  detail with the patient. Please refer to After Visit Summary for other counseling recommendations.   Return in 4 weeks (on 01/12/2024) for 28 wk labs.  Future Appointments  Date Time Provider Department Center  12/16/2023  2:00 PM [facility]  01/12/2024  8:30 AM [facility] LAB [facility]  01/12/2024  8:55 AM Anyanwu, Gloris LABOR, MD [facility]  01/26/2024 10:35 AM Izell Harari, MD [facility]  02/09/2024 10:35 AM Anyanwu, Gloris LABOR, MD [facility]  02/23/2024 10:35 AM Anyanwu, Gloris LABOR, MD [facility]    Glenys GORMAN Birk, MD

## 2023-12-15 NOTE — Progress Notes (Signed)
 ROB   CC: None

## 2023-12-16 ENCOUNTER — Ambulatory Visit: Payer: MEDICAID

## 2023-12-16 ENCOUNTER — Other Ambulatory Visit: Payer: Self-pay

## 2023-12-16 ENCOUNTER — Ambulatory Visit: Payer: MEDICAID | Attending: Maternal & Fetal Medicine

## 2023-12-16 VITALS — BP 134/70 | HR 103

## 2023-12-16 DIAGNOSIS — Z3A24 24 weeks gestation of pregnancy: Secondary | ICD-10-CM

## 2023-12-16 DIAGNOSIS — O99212 Obesity complicating pregnancy, second trimester: Secondary | ICD-10-CM

## 2023-12-16 DIAGNOSIS — Z8774 Personal history of (corrected) congenital malformations of heart and circulatory system: Secondary | ICD-10-CM

## 2023-12-16 DIAGNOSIS — O35EXX1 Maternal care for other (suspected) fetal abnormality and damage, fetal genitourinary anomalies, fetus 1: Secondary | ICD-10-CM

## 2023-12-16 DIAGNOSIS — Z3480 Encounter for supervision of other normal pregnancy, unspecified trimester: Secondary | ICD-10-CM

## 2023-12-16 DIAGNOSIS — O280 Abnormal hematological finding on antenatal screening of mother: Secondary | ICD-10-CM | POA: Diagnosis not present

## 2023-12-16 DIAGNOSIS — O09522 Supervision of elderly multigravida, second trimester: Secondary | ICD-10-CM | POA: Diagnosis not present

## 2023-12-16 DIAGNOSIS — E669 Obesity, unspecified: Secondary | ICD-10-CM | POA: Diagnosis not present

## 2024-01-12 ENCOUNTER — Ambulatory Visit (INDEPENDENT_AMBULATORY_CARE_PROVIDER_SITE_OTHER): Payer: MEDICAID | Admitting: Obstetrics & Gynecology

## 2024-01-12 ENCOUNTER — Other Ambulatory Visit: Payer: MEDICAID

## 2024-01-12 VITALS — BP 106/72 | HR 101 | Wt 205.4 lb

## 2024-01-12 DIAGNOSIS — O09523 Supervision of elderly multigravida, third trimester: Secondary | ICD-10-CM

## 2024-01-12 DIAGNOSIS — Z23 Encounter for immunization: Secondary | ICD-10-CM | POA: Diagnosis not present

## 2024-01-12 DIAGNOSIS — Z3A28 28 weeks gestation of pregnancy: Secondary | ICD-10-CM | POA: Diagnosis not present

## 2024-01-12 DIAGNOSIS — Z3480 Encounter for supervision of other normal pregnancy, unspecified trimester: Secondary | ICD-10-CM

## 2024-01-12 DIAGNOSIS — Z3009 Encounter for other general counseling and advice on contraception: Secondary | ICD-10-CM | POA: Diagnosis not present

## 2024-01-12 NOTE — Patient Instructions (Signed)
 RSV Vaccination for Pregnant People  CDC recommends two ways to protect babies from getting very sick with Respiratory Syncytial Virus (RSV):  An RSV vaccination given during pregnancy  Pfizer's vaccine Verdis Frederickson) is recommended for use during pregnancy. It is given during RSV season to people who are 32 through [redacted] weeks pregnant.  Or, An RSV immunization given directly to infants and some older babies  Babies born to mothers who get RSV vaccine at least 2 weeks before delivery will have protection and, in most cases, should not need an RSV immunization later.    When is RSV season?  In most regions of the [place] RSV season starts in the fall and peaks in the winter, but the timing and severity of RSV season can vary from place to place and year to year.   The goal of maternal RSV vaccination is to protect babies from getting very sick with RSV during their first RSV season.  In most of the [place], this means maternal RSV vaccine will be given in September through January.  Who should get the maternal RSV vaccine?  People who are 85 through [redacted] weeks pregnant during September through January should get one dose of maternal RSV vaccine to protect their babies. RSV season can vary around the country.   How is the maternal RSV vaccine administered?  Maternal RSV vaccine is given as a shot into the mother's upper arm. Only a single dose (one shot) of maternal RSV vaccine is recommended.   It is not yet known whether another dose might be needed in later pregnancies.  How well does the maternal RSV vaccine work?  When someone gets RSV vaccine, their body responds by making a protein that protects against the virus that causes RSV. The process takes about 2 weeks. When a pregnant person gets RSV vaccine, their protective proteins (called antibodies) also pass to their baby. So, babies who are born at least 2 weeks after their mother gets RSV vaccine are protected  at birth, when infants are at the highest risk of severe RSV disease.   The vaccine can reduce a baby's risk of being hospitalized from RSV by 57% in the first six months after birth.  What are the possible side effects of the maternal RSV vaccine?  In the clinical trials, the side effects most often reported by pregnant people who received the maternal RSV vaccine were pain at the injection site, headache, muscle pain, and nausea.  Although not common, a dangerous high blood pressure condition called pre-eclampsia occurred in 1.8% of pregnant people who received the maternal RSV vaccine compared to 1.4% of pregnant people who received a placebo.  The clinical trials identified a small increase in the number of preterm births in vaccinated pregnant people. It is not clear if this is a true safety problem related to RSV vaccine or if this occurred for reasons unrelated to vaccination.  To reduce the potential risk of preterm birth and complications from RSV disease, FDA approved the maternal RSV vaccine for use during weeks 32 through 84 of pregnancy while additional studies are conducted.  FDA is requiring the manufacturer to do additional studies that will look more closely at the potential risk of preterm births and pregnancy-related high blood pressure issues in mothers, including pre-eclampsia.  Severe allergic reactions to vaccines are rare but can happen after any vaccine and can be life-threatening. If you see signs of a severe allergic reaction after vaccination (hives, swelling of the face  and throat, difficulty breathing, a fast heartbeat, dizziness, or weakness), seek immediate medical care by calling 911.  As with any medicine or vaccine there is a very remote chance of the vaccine causing other serious injury or death after vaccination.  Adverse events following vaccination should be reported to the Vaccine Adverse Event Reporting System (VAERS), even if it's not clear that the vaccine  caused the adverse event. You or your doctor can report an adverse event to [facility] and FDA through VAERS. If you need further assistance reporting to VAERS, please email [email or web address] [email or web address] or call [phone].  If you have any questions about side effects from the maternal RSV vaccine, talk with your healthcare provider.  Do I need a prescription for a maternal RSV vaccine?  Until the vaccine available in the office, you will need a prescription to take to a local pharmacy that is providing the vaccine.   How do I pay for the maternal RSV vaccine?  Most private health insurance plans cover the maternal RSV vaccine, but there may be a cost to you depending on your plan.  Contact your insurer to find out.  Medicaid Beginning January 05, 2022, most people with coverage from [facility] and [employer] [facility]) will be guaranteed coverage of all vaccines recommended by the Advisory Committee on Immunization Practice at no cost to them.   Source: [facility] for Immunization and Respiratory Diseases

## 2024-01-12 NOTE — Progress Notes (Signed)
   PRENATAL VISIT NOTE  Subjective:  [name] is a 38 y.o. H4E5995 at [redacted]w[redacted]d being seen today for ongoing prenatal care.  She is currently monitored for the following issues for this low-risk pregnancy and has S/P VSD repair; Anxiety and depression; Supervision of other normal pregnancy, antepartum; Advanced maternal age in multigravida; and Consultation for female sterilization on their problem list.  Patient reports no complaints.  Contractions: Not present. Vag. Bleeding: None.  Movement: Present. Denies leaking of fluid.   The following portions of the patient's history were reviewed and updated as appropriate: allergies, current medications, past family history, past medical history, past social history, past surgical history and problem list.   Objective:    Vitals:   01/12/24 0847  BP: 106/72  Pulse: (!) 101  Weight: 205 lb 6.4 oz (93.2 kg)    Fetal Status:  Fetal Heart Rate (bpm): 139   Movement: Present    General: Alert, oriented and cooperative. Patient is in no acute distress.  Skin: Skin is warm and dry. No rash noted.   Cardiovascular: Normal heart rate noted  Respiratory: Normal respiratory effort, no problems with respiration noted  Abdomen: Soft, gravid, appropriate for gestational age.  Pain/Pressure: Present     Pelvic: Cervical exam deferred        Extremities: Normal range of motion.  Edema: None  Mental Status: Normal mood and affect. Normal behavior. Normal judgment and thought content.   Assessment and Plan:  Pregnancy: G5P4004 at [redacted]w[redacted]d 1. Multigravida of advanced maternal age in third trimester (Primary) Growth scan tomorrow, will follow up results and manage accordingly.  2. Consultation for female sterilization Medicaid consent signed today, discussed PPBTL risks and benefits in detail.  3. Need for Tdap vaccination - Tdap vaccine greater than or equal to 7yo IM given today  4. [redacted] weeks gestation of pregnancy 5. Supervision of other normal  pregnancy, antepartum Third trimester labs today.  Preterm labor symptoms and general obstetric precautions including but not limited to vaginal bleeding, contractions, leaking of fluid and fetal movement were reviewed in detail with the patient. Please refer to After Visit Summary for other counseling recommendations.   Return in about 2 weeks (around 01/26/2024) for OFFICE OB VISIT (MD or APP).  Future Appointments  Date Time Provider Department Center  01/13/2024 11:00 AM [facility]  01/26/2024 10:35 AM Izell Harari, MD [facility]  02/09/2024 10:35 AM Dino Borntreger, Gloris LABOR, MD [facility]  02/23/2024 10:35 AM Shirely Toren, Gloris LABOR, MD [facility]    Gloris Hugger, MD

## 2024-01-13 ENCOUNTER — Ambulatory Visit: Payer: MEDICAID | Attending: Maternal & Fetal Medicine

## 2024-01-13 ENCOUNTER — Ambulatory Visit: Payer: Self-pay | Admitting: Obstetrics & Gynecology

## 2024-01-13 ENCOUNTER — Other Ambulatory Visit: Payer: Self-pay

## 2024-01-13 ENCOUNTER — Other Ambulatory Visit: Payer: Self-pay | Admitting: Maternal & Fetal Medicine

## 2024-01-13 ENCOUNTER — Ambulatory Visit (HOSPITAL_BASED_OUTPATIENT_CLINIC_OR_DEPARTMENT_OTHER): Payer: MEDICAID

## 2024-01-13 VITALS — BP 125/83 | HR 105

## 2024-01-13 DIAGNOSIS — Z3480 Encounter for supervision of other normal pregnancy, unspecified trimester: Secondary | ICD-10-CM

## 2024-01-13 DIAGNOSIS — O283 Abnormal ultrasonic finding on antenatal screening of mother: Secondary | ICD-10-CM

## 2024-01-13 DIAGNOSIS — Z3A28 28 weeks gestation of pregnancy: Secondary | ICD-10-CM

## 2024-01-13 DIAGNOSIS — O35EXX1 Maternal care for other (suspected) fetal abnormality and damage, fetal genitourinary anomalies, fetus 1: Secondary | ICD-10-CM

## 2024-01-13 DIAGNOSIS — E669 Obesity, unspecified: Secondary | ICD-10-CM | POA: Diagnosis not present

## 2024-01-13 DIAGNOSIS — O365921 Maternal care for other known or suspected poor fetal growth, second trimester, fetus 1: Secondary | ICD-10-CM

## 2024-01-13 DIAGNOSIS — O99213 Obesity complicating pregnancy, third trimester: Secondary | ICD-10-CM

## 2024-01-13 DIAGNOSIS — O365930 Maternal care for other known or suspected poor fetal growth, third trimester, not applicable or unspecified: Secondary | ICD-10-CM

## 2024-01-13 DIAGNOSIS — Z3A18 18 weeks gestation of pregnancy: Secondary | ICD-10-CM | POA: Diagnosis not present

## 2024-01-13 DIAGNOSIS — Z148 Genetic carrier of other disease: Secondary | ICD-10-CM

## 2024-01-13 DIAGNOSIS — O365990 Maternal care for other known or suspected poor fetal growth, unspecified trimester, not applicable or unspecified: Secondary | ICD-10-CM

## 2024-01-13 DIAGNOSIS — O09523 Supervision of elderly multigravida, third trimester: Secondary | ICD-10-CM

## 2024-01-13 LAB — CBC
Hematocrit: 34.1 % (ref 34.0–46.6)
Hemoglobin: 11.2 g/dL (ref 11.1–15.9)
MCH: 28.3 pg (ref 26.6–33.0)
MCHC: 32.8 g/dL (ref 31.5–35.7)
MCV: 86 fL (ref 79–97)
Platelets: 244 x10E3/uL (ref 150–450)
RBC: 3.96 x10E6/uL (ref 3.77–5.28)
RDW: 13.2 % (ref 11.7–15.4)
WBC: 8 x10E3/uL (ref 3.4–10.8)

## 2024-01-13 LAB — GLUCOSE TOLERANCE, 2 HOURS W/ 1HR
Glucose, 1 hour: 158 mg/dL (ref 70–179)
Glucose, 2 hour: 137 mg/dL (ref 70–152)
Glucose, Fasting: 83 mg/dL (ref 70–91)

## 2024-01-13 LAB — HIV ANTIBODY (ROUTINE TESTING W REFLEX): HIV Screen 4th Generation wRfx: NONREACTIVE

## 2024-01-13 LAB — RPR: RPR Ser Ql: NONREACTIVE

## 2024-01-13 NOTE — Procedures (Signed)
 [name] 07-Jun-1985 [redacted]w[redacted]d  Fetus A Non-Stress Test Interpretation for 01/13/24  Indication: IUGR  Fetal Heart Rate A Mode: External Baseline Rate (A): 145 bpm Variability: Moderate Accelerations: 15 x 15, 10 x 10 Decelerations: None Multiple birth?: No  Uterine Activity Mode: Toco, Palpation Contraction Frequency (min): none noted Resting Tone Palpated: Relaxed  Interpretation (Fetal Testing) Nonstress Test Interpretation: Reactive Comments: Reviewed with Dr. Kizzie

## 2024-01-18 ENCOUNTER — Ambulatory Visit (INDEPENDENT_AMBULATORY_CARE_PROVIDER_SITE_OTHER): Payer: MEDICAID | Admitting: *Deleted

## 2024-01-18 VITALS — BP 124/87 | HR 103

## 2024-01-18 DIAGNOSIS — O365931 Maternal care for other known or suspected poor fetal growth, third trimester, fetus 1: Secondary | ICD-10-CM | POA: Diagnosis not present

## 2024-01-18 DIAGNOSIS — Z3A29 29 weeks gestation of pregnancy: Secondary | ICD-10-CM | POA: Diagnosis not present

## 2024-01-18 DIAGNOSIS — O365990 Maternal care for other known or suspected poor fetal growth, unspecified trimester, not applicable or unspecified: Secondary | ICD-10-CM

## 2024-01-18 NOTE — Progress Notes (Signed)
 Pt here today for NST. Denies any issues at this time.   NST reassuring and reactive.   Wanda Buckles, RN

## 2024-01-25 DIAGNOSIS — O365990 Maternal care for other known or suspected poor fetal growth, unspecified trimester, not applicable or unspecified: Secondary | ICD-10-CM | POA: Insufficient documentation

## 2024-01-26 ENCOUNTER — Encounter: Payer: Self-pay | Admitting: Obstetrics and Gynecology

## 2024-01-26 ENCOUNTER — Ambulatory Visit: Payer: MEDICAID | Admitting: Obstetrics and Gynecology

## 2024-01-26 VITALS — BP 123/76 | HR 94 | Wt 207.0 lb

## 2024-01-26 DIAGNOSIS — Z3480 Encounter for supervision of other normal pregnancy, unspecified trimester: Secondary | ICD-10-CM

## 2024-01-26 DIAGNOSIS — Z8774 Personal history of (corrected) congenital malformations of heart and circulatory system: Secondary | ICD-10-CM

## 2024-01-26 DIAGNOSIS — O99343 Other mental disorders complicating pregnancy, third trimester: Secondary | ICD-10-CM

## 2024-01-26 DIAGNOSIS — Z3A30 30 weeks gestation of pregnancy: Secondary | ICD-10-CM

## 2024-01-26 DIAGNOSIS — F419 Anxiety disorder, unspecified: Secondary | ICD-10-CM

## 2024-01-26 DIAGNOSIS — O365930 Maternal care for other known or suspected poor fetal growth, third trimester, not applicable or unspecified: Secondary | ICD-10-CM

## 2024-01-26 DIAGNOSIS — O09523 Supervision of elderly multigravida, third trimester: Secondary | ICD-10-CM

## 2024-01-26 NOTE — Progress Notes (Signed)
 ROB   CC: None

## 2024-01-26 NOTE — Progress Notes (Signed)
   PRENATAL VISIT NOTE  Subjective:  [name] is a 38 y.o. H4E5995 at [redacted]w[redacted]d being seen today for ongoing prenatal care.  She is currently monitored for the following issues for this high-risk pregnancy and has S/P VSD repair; Anxiety and depression; Supervision of other normal pregnancy, antepartum; Advanced maternal age in multigravida; Consultation for female sterilization; and IUGR (intrauterine growth restriction) affecting care of mother on their problem list.  Patient reports no complaints.  Contractions: Not present. Vag. Bleeding: None.  Movement: Present. Denies leaking of fluid.   The following portions of the patient's history were reviewed and updated as appropriate: allergies, current medications, past family history, past medical history, past social history, past surgical history and problem list.   Objective:    Vitals:   01/26/24 1044  BP: 123/76  Pulse: 94  Weight: 207 lb (93.9 kg)    Fetal Status:  Fetal Heart Rate (bpm): 154   Movement: Present    General: Alert, oriented and cooperative. Patient is in no acute distress.  Skin: Skin is warm and dry. No rash noted.   Cardiovascular: Normal heart rate noted  Respiratory: Normal respiratory effort, no problems with respiration noted  Abdomen: Soft, gravid, appropriate for gestational age.  Pain/Pressure: Absent     Pelvic: Cervical exam deferred        Extremities: Normal range of motion.  Edema: None  Mental Status: Normal mood and affect. Normal behavior. Normal judgment and thought content.   Assessment and Plan:  Pregnancy: G5P4004 at [redacted]w[redacted]d 1. Supervision of other normal pregnancy, antepartum (Primary) 28wk labs normal  2. 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 Poor fetal growth affecting management of mother in third trimester, single or unspecified fetus Diagnosed on 10/8. D/w her re: plan of care, fetal kick counts 10/8: efw 5%, 1020g, ac 7%, afi 11, ua  dopplers wnl  4. Anxiety and depression Doing well on no meds  5. Multigravida of advanced maternal age in third trimester No issues  Preterm labor symptoms and general obstetric precautions including but not limited to vaginal bleeding, contractions, leaking of fluid and fetal movement were reviewed in detail with the patient. Please refer to After Visit Summary for other counseling recommendations.   No follow-ups on file.  Future Appointments  Date Time Provider Department Center  01/28/2024  7:15 AM [facility] PROVIDER 1 [facility]  01/28/2024  7:30 AM [facility] US3 [facility]  01/28/2024  8:45 AM [facility] NST [facility]  02/04/2024  7:15 AM [facility] PROVIDER 1 [facility]  02/04/2024  7:30 AM [facility] US5 [facility]  02/04/2024  8:45 AM [facility] NST [facility]  02/09/2024 10:35 AM Anyanwu, Gloris LABOR, MD [facility]  02/23/2024 10:35 AM Anyanwu, Gloris LABOR, MD [facility]    Bebe Furry, MD

## 2024-01-28 ENCOUNTER — Ambulatory Visit: Payer: MEDICAID | Admitting: Obstetrics

## 2024-01-28 ENCOUNTER — Ambulatory Visit (HOSPITAL_BASED_OUTPATIENT_CLINIC_OR_DEPARTMENT_OTHER): Payer: MEDICAID

## 2024-01-28 ENCOUNTER — Other Ambulatory Visit: Payer: Self-pay | Admitting: *Deleted

## 2024-01-28 ENCOUNTER — Ambulatory Visit: Payer: MEDICAID | Attending: Obstetrics and Gynecology

## 2024-01-28 VITALS — BP 133/68 | HR 100

## 2024-01-28 DIAGNOSIS — O09523 Supervision of elderly multigravida, third trimester: Secondary | ICD-10-CM

## 2024-01-28 DIAGNOSIS — O99213 Obesity complicating pregnancy, third trimester: Secondary | ICD-10-CM

## 2024-01-28 DIAGNOSIS — O365930 Maternal care for other known or suspected poor fetal growth, third trimester, not applicable or unspecified: Secondary | ICD-10-CM

## 2024-01-28 DIAGNOSIS — O283 Abnormal ultrasonic finding on antenatal screening of mother: Secondary | ICD-10-CM

## 2024-01-28 DIAGNOSIS — E669 Obesity, unspecified: Secondary | ICD-10-CM | POA: Diagnosis not present

## 2024-01-28 DIAGNOSIS — Z148 Genetic carrier of other disease: Secondary | ICD-10-CM

## 2024-01-28 DIAGNOSIS — Z3A30 30 weeks gestation of pregnancy: Secondary | ICD-10-CM | POA: Insufficient documentation

## 2024-01-28 DIAGNOSIS — O365990 Maternal care for other known or suspected poor fetal growth, unspecified trimester, not applicable or unspecified: Secondary | ICD-10-CM | POA: Diagnosis present

## 2024-01-28 NOTE — Progress Notes (Signed)
 MFM Consult Note  [name] is currently at 30 weeks and 4 days.  She has been followed due to advanced maternal age and IUGR.   She denies any problems since her last exam and reports feeling vigorous fetal movements throughout the day.  She had a reactive NST today.  The total AFI was 11.64 cm.  Doppler studies of the umbilical arteries performed due to fetal growth restriction showed a normal S/D ratio of 3.04.  There were no signs of absent or reversed end-diastolic flow noted today.    The fetus was in the breech presentation.  Due to IUGR, we will continue to follow her with weekly fetal testing and umbilical artery Doppler studies.    Should IUGR continue to be noted later in her pregnancy, delivery will be recommended at around 38 weeks.  She will return in 1 week for an NST, growth scan, and umbilical artery Doppler study.    The patient stated that all of her questions were answered today.  A total of 10 minutes was spent counseling and coordinating the care for this patient.  Greater than 50% of the time was spent in direct face-to-face contact.

## 2024-01-28 NOTE — Procedures (Signed)
 [name] 06-08-1985 [redacted]w[redacted]d  Fetus A Non-Stress Test Interpretation for 01/28/24  Indication: IUGR  Fetal Heart Rate A Mode: External Baseline Rate (A): 140 bpm Variability: Moderate Accelerations: 15 x 15 Decelerations: None Multiple birth?: No  Uterine Activity Mode: Palpation, Toco Contraction Frequency (min): None noted Resting Tone Palpated: Relaxed  Interpretation (Fetal Testing) Nonstress Test Interpretation: Reactive Comments: Reviewed with Dr. Ileana

## 2024-02-04 ENCOUNTER — Other Ambulatory Visit: Payer: Self-pay | Admitting: *Deleted

## 2024-02-04 ENCOUNTER — Ambulatory Visit (HOSPITAL_BASED_OUTPATIENT_CLINIC_OR_DEPARTMENT_OTHER): Payer: MEDICAID | Admitting: Obstetrics

## 2024-02-04 ENCOUNTER — Ambulatory Visit: Payer: MEDICAID | Attending: Obstetrics and Gynecology

## 2024-02-04 ENCOUNTER — Ambulatory Visit (HOSPITAL_BASED_OUTPATIENT_CLINIC_OR_DEPARTMENT_OTHER): Payer: MEDICAID

## 2024-02-04 VITALS — BP 127/67 | HR 101

## 2024-02-04 DIAGNOSIS — O99213 Obesity complicating pregnancy, third trimester: Secondary | ICD-10-CM | POA: Diagnosis not present

## 2024-02-04 DIAGNOSIS — O365930 Maternal care for other known or suspected poor fetal growth, third trimester, not applicable or unspecified: Secondary | ICD-10-CM | POA: Insufficient documentation

## 2024-02-04 DIAGNOSIS — O365990 Maternal care for other known or suspected poor fetal growth, unspecified trimester, not applicable or unspecified: Secondary | ICD-10-CM | POA: Insufficient documentation

## 2024-02-04 DIAGNOSIS — O09523 Supervision of elderly multigravida, third trimester: Secondary | ICD-10-CM

## 2024-02-04 DIAGNOSIS — Z3A31 31 weeks gestation of pregnancy: Secondary | ICD-10-CM

## 2024-02-04 DIAGNOSIS — E669 Obesity, unspecified: Secondary | ICD-10-CM | POA: Diagnosis not present

## 2024-02-04 DIAGNOSIS — O35EXX0 Maternal care for other (suspected) fetal abnormality and damage, fetal genitourinary anomalies, not applicable or unspecified: Secondary | ICD-10-CM

## 2024-02-04 NOTE — Progress Notes (Signed)
 MFM Consult Note  [name] is currently at 31 weeks and 4 days.  She has been followed due to advanced maternal age and IUGR.   She denies any problems since her last exam and reports feeling fetal movements throughout the day.  She had a reactive NST today.  Sonographic findings Single intrauterine pregnancy at 31w 4d.  Fetal cardiac activity:  Observed and appears normal. Presentation: Breech. Fetal biometry shows the estimated fetal weight of 3 pounds 10 ounces which measures at the 18th percentile. Amniotic fluid volume: Within normal limits.  AFI: 13.57 cm.  MVP: 5. cm. Placenta: Posterior.  Doppler studies of the umbilical arteries performed due to fetal growth restriction showed a normal S/D ratio of 3.06.  There were no signs of absent or reversed end-diastolic flow noted today.    As the previously noted IUGR has resolved, she will return for another growth scan in 3 weeks.    She was advised that as long as the EFW remains above the 10th percentile during her future growth scans, delivery will be recommended at between 39 to 40 weeks.    Should IUGR be noted again later in her pregnancy, delivery will be recommended at between 37 to 38 weeks.  The patient stated that all of her questions were answered today.  A total of 10 minutes was spent counseling and coordinating the care for this patient.  Greater than 50% of the time was spent in direct face-to-face contact.

## 2024-02-04 NOTE — Procedures (Signed)
 [name] January 03, 1986 [redacted]w[redacted]d  Fetus A Non-Stress Test Interpretation for 02/04/24  Indication: IUGR  Fetal Heart Rate A Mode: External Baseline Rate (A): 140 bpm Accelerations: 15 x 15 Decelerations: None Multiple birth?: No  Uterine Activity Mode: Palpation, Toco Contraction Frequency (min): none noted Resting Tone Palpated: Relaxed  Interpretation (Fetal Testing) Nonstress Test Interpretation: Reactive Comments: Reviewed with Dr. Ileana

## 2024-02-09 ENCOUNTER — Ambulatory Visit (INDEPENDENT_AMBULATORY_CARE_PROVIDER_SITE_OTHER): Payer: MEDICAID | Admitting: Obstetrics & Gynecology

## 2024-02-09 ENCOUNTER — Encounter: Payer: Self-pay | Admitting: Obstetrics & Gynecology

## 2024-02-09 VITALS — BP 117/79 | HR 112 | Wt 207.4 lb

## 2024-02-09 DIAGNOSIS — O09523 Supervision of elderly multigravida, third trimester: Secondary | ICD-10-CM

## 2024-02-09 DIAGNOSIS — Z3A32 32 weeks gestation of pregnancy: Secondary | ICD-10-CM

## 2024-02-09 DIAGNOSIS — Z3480 Encounter for supervision of other normal pregnancy, unspecified trimester: Secondary | ICD-10-CM

## 2024-02-09 NOTE — Progress Notes (Addendum)
 PRENATAL VISIT NOTE  Subjective:  [name] is a 38 y.o. [id number] at [redacted]w[redacted]d being seen today for ongoing prenatal care.  She is currently monitored for the following issues for this low-risk pregnancy and has S/P VSD repair; Anxiety and depression; Supervision of other normal pregnancy, antepartum; Advanced maternal age in multigravida; and Consultation for female sterilization on their problem list.  Patient reports no complaints.  Contractions: Irritability. Vag. Bleeding: None.  Movement: Present. Denies leaking of fluid.   The following portions of the patient's history were reviewed and updated as appropriate: allergies, current medications, past family history, past medical history, past social history, past surgical history and problem list.   Objective:   Vitals:   02/09/24 1047  BP: 117/79  Pulse: (!) 112  Weight: 207 lb 6.4 oz (94.1 kg)    Fetal Status:  Fetal Heart Rate (bpm): 160   Movement: Present    General: Alert, oriented and cooperative. Patient is in no acute distress.  Skin: Skin is warm and dry. No rash noted.   Cardiovascular: Normal heart rate noted  Respiratory: Normal respiratory effort, no problems with respiration noted  Abdomen: Soft, gravid, appropriate for gestational age.  Pain/Pressure: Present     Pelvic: Cervical exam deferred        Extremities: Normal range of motion.  Edema: None  Mental Status: Normal mood and affect. Normal behavior. Normal judgment and thought content.      01/12/2024    8:54 AM 09/22/2023   11:23 AM 04/09/2023    3:26 PM  Depression screen PHQ 2/9  Decreased Interest 0 0 0  Down, Depressed, Hopeless 0 0 1  PHQ - 2 Score 0 0 1  Altered sleeping 0 1 1  Tired, decreased energy 0 1 1  Change in appetite 0 0 0  Feeling bad or failure about yourself  0 0 0  Trouble concentrating 0 0 0  Moving slowly or fidgety/restless 0 0 0  Suicidal thoughts 0 0 0  PHQ-9 Score 0 2 3  Difficult doing work/chores  Not difficult at  all Not difficult at all        01/12/2024    8:55 AM 09/22/2023   11:23 AM 04/09/2023    3:26 PM 03/12/2023    2:42 PM  GAD 7 : Generalized Anxiety Score  Nervous, Anxious, on Edge 1 0 1 0  Control/stop worrying 0 0 0 0  Worry too much - different things 0 0 0 0  Trouble relaxing 0 0 1 0  Restless 0 0 0 0  Easily annoyed or irritable 0 1 1 1   Afraid - awful might happen 0 0 0 0  Total GAD 7 Score 1 1 3 1   Anxiety Difficulty  Not difficult at all Not difficult at all Not difficult at all   US  MFM OB FOLLOW UP Result Date: 02/04/2024 ----------------------------------------------------------------------  OBSTETRICS REPORT                       (Signed Final 02/04/2024 10:40 am) ---------------------------------------------------------------------- Patient Info  ID #:       [id number]                          D.O.B.:  25-Jan-1986 (37 yrs)(F)  Name:       [name]                 Visit Date: 02/04/2024 07:16  am ---------------------------------------------------------------------- Performed By  Attending:        Steffan Keys MD         Ref. Address:     [address]                                                             [address]  Performed By:     Emelia Coombs BS,       Location[facility]                    RDMS, RVT                                [facility]  Referred By:      [facility] ---------------------------------------------------------------------- Orders  #  Description                           Code        Ordered By  1  US  MFM OB FOLLOW UP                   23183.98    NATHANEL FETTERS  2  US  MFM UA CORD DOPPLER                23179.97    NATHANEL FETTERS  ----------------------------------------------------------------------  #  Order #                     Accession #                Episode #  1  [id number]                   [id number]  [id number]  2  [id number]                   [id number]                 [id number] ---------------------------------------------------------------------- Indications  Maternal care for known or suspected poor      O36.5930  fetal growth, third trimester, single or  unspecified fetus IUGR  [redacted] weeks gestation of pregnancy                Z3A.70  Advanced maternal age multigravida 78+,        O73.523  third trimester  Obesity complicating pregnancy, third          O99.213  trimester (BMI 34)  Medical complication of pregnancy (Maternal    O26.90  VSD repair as a child)  Pyelectasis of fetus on prenatal ultrasound    O28.3  (resolved)  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 Cord Insertion:      Previously seen  Amniotic Fluid  AFI FV:      Within normal limits  AFI Sum(cm)     %Tile       Largest Pocket(cm)  13.57           43          5  RUQ(cm)       RLQ(cm)       LUQ(cm)        LLQ(cm)  5             2.13          4.12           2.32 ---------------------------------------------------------------------- Biometry  BPD:      77.1  mm     G. Age:  31w 0d         22  %    CI:        68.95   %    70 - 86                                                          FL/HC:      18.5   %    19.1 - 21.3  HC:      296.6  mm     G. Age:  32w 6d         46  %    HC/AC:      1.08        0.96 - 1.17  AC:      275.3  mm     G. Age:  31w 4d         48  %    FL/BPD:  71.1   %    71 - 87  FL:        54.8  mm     G. Age:  29w 0d        < 1  %    FL/AC:      19.9   %    20 - 24  LV:        4.2  mm  Est. FW:    1650  gm    3 lb 10 oz      18  % ---------------------------------------------------------------------- OB History  Blood Type:   A+  Gravidity:    5         Term:   4        Prem:   0        SAB:   0  TOP:          0       Ectopic:  0        Living: 4 ---------------------------------------------------------------------- Gestational Age  LMP:           31w 4d        Date:  06/28/23                  EDD:   04/03/24  U/S Today:     31w 1d                                        EDD:   04/06/24  Best:          31w 4d     Det. By:  LMP  (06/28/23)          EDD:   04/03/24 ---------------------------------------------------------------------- Anatomy  Cranium:               Previously seen        Aortic Arch:            Previously seen  Cavum:                 Previously seen        Ductal Arch:            Previously seen  Ventricles:            Appears normal         Diaphragm:              Appears normal  Choroid Plexus:        Previously seen        Stomach:                Appears normal, left                                                                        sided  Cerebellum:            Previously seen        Abdomen:                Previously seen  Posterior Fossa:       Previously seen  Abdominal Wall:         Previously seen  Face:                  Orbits and profile     Cord Vessels:           Previously seen                         previously seen  Lips:                  Previously seen        Kidneys:                Appear normal  Thoracic:              Appears normal         Bladder:                Appears normal  Heart:                 Appears normal         Spine:                  Previously seen                         (4CH, axis, and                         situs)  RVOT:                  Previously seen        Upper Extremities:      Previously seen  LVOT:                  Previously  seen        Lower Extremities:      Previously seen  Other:  Technically difficult due to advanced GA and fetal position. ---------------------------------------------------------------------- Doppler - Fetal Vessels  Umbilical Artery   S/D     %tile      RI    %tile      PI    %tile     PSV    ADFV    RDFV                                                     (cm/s)   3.06       70    0.67       73    1.08       80    47.65      No      No ---------------------------------------------------------------------- Cervix Uterus Adnexa  Cervix  Not visualized (advanced GA >24wks)  Uterus  No abnormality visualized. ---------------------------------------------------------------------- Comments  Amoria Mclees is currently at 31 weeks and 4 days.  She has  been followed due to advanced maternal age and IUGR.  She denies any problems since her last exam and reports  feeling  fetal movements throughout the day.  She had a reactive NST today.  Sonographic findings  Single intrauterine pregnancy at 31w 4d.  Fetal cardiac activity:  Observed and appears normal.  Presentation: Breech.  Fetal biometry shows  the estimated fetal weight of 3 pounds  10 ounces which measures at the 18th percentile.  Amniotic fluid volume: Within normal limits.  AFI: 13.57 cm.  MVP: 5. cm.  Placenta: Posterior.  Doppler studies of the umbilical arteries performed due to  fetal growth restriction showed a normal S/D ratio of 3.06.  There were no signs of absent or reversed end-diastolic flow  noted today.  As the previously noted IUGR has resolved, she will return for  another growth scan in 3 weeks.  She was advised that as long as the EFW remains above the  10th percentile during her future growth scans, delivery will  be recommended at between 39 to 40 weeks.  Should IUGR be noted again later in her pregnancy, delivery  will be recommended at between 37 to 38 weeks.  The patient stated that all of her questions were answered  today.  A total of 10  minutes was spent counseling and coordinating  the care for this patient.  Greater than 50% of the time was  spent in direct face-to-face contact. ----------------------------------------------------------------------                   Steffan Keys, MD Electronically Signed Final Report   02/04/2024 10:40 am ----------------------------------------------------------------------   US  MFM UA CORD DOPPLER Result Date: 02/04/2024 ----------------------------------------------------------------------  OBSTETRICS REPORT                       (Signed Final 02/04/2024 10:40 am) ---------------------------------------------------------------------- Patient Info  ID #:       [id number]                          D.O.B.:  Jul 18, 1985 (37 yrs)(F)  Name:       [name]                 Visit Date: 02/04/2024 07:16 am ---------------------------------------------------------------------- Performed By  Attending:        Steffan Keys MD         Ref. Address:     [address]                                                             [address]  Performed By:     Emelia Coombs BS,       Location:         [facility]                    RD[facility], RVT                                [facility]  Referred By:      [facility] Correne Beagle ---------------------------------------------------------------------- Orders  #  Description                           Code        Ordered By  1  US  MFM OB FOLLOW UP                   [id number]    NATHANEL FETTERS  2  US  MFM UA CORD DOPPLER                76820.02    NATHANEL FETTERS ----------------------------------------------------------------------  #  Order #                     Accession #                Episode #  1  [id number]                    [id number]                 [id number]  2  [id number]                   [id number]                 [id number] ---------------------------------------------------------------------- Indications  Maternal care for known or suspected poor      O36.5930  fetal growth, third trimester, single or  unspecified fetus IUGR  [redacted] weeks gestation of pregnancy                Z3A.20  Advanced maternal age multigravida 17+,        O78.523  third trimester  Obesity complicating pregnancy, third          O99.213  trimester (BMI 34)  Medical complication of pregnancy (Maternal    O26.90  VSD repair as a child)  Pyelectasis of fetus on prenatal ultrasound    O28.3  (resolved)  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 Cord Insertion:      Previously seen  Amniotic Fluid  AFI FV:      Within normal limits  AFI Sum(cm)     %  Tile       Largest Pocket(cm)  13.57           43          5  RUQ(cm)       RLQ(cm)       LUQ(cm)        LLQ(cm)  5             2.13          4.12           2.32 ---------------------------------------------------------------------- Biometry  BPD:      77.1  mm     G. Age:  31w 0d         22  %    CI:        68.95   %    70 - 86                                                          FL/HC:      18.5   %    19.1 - 21.3  HC:      296.6  mm     G. Age:  32w 6d         46  %    HC/AC:      1.08        0.96 - 1.17  AC:      275.3  mm     G. Age:  31w 4d         48  %    FL/BPD:     71.1   %    71 - 87  FL:       54.8  mm     G. Age:  29w 0d        < 1  %    FL/AC:      19.9   %    20 - 24  LV:        4.2  mm  Est. FW:    1650  gm    3 lb 10 oz      18  %  ---------------------------------------------------------------------- OB History  Blood Type:   A+  Gravidity:    5         Term:   4        Prem:   0        SAB:   0  TOP:          0       Ectopic:  0        Living: 4 ---------------------------------------------------------------------- Gestational Age  LMP:           31w 4d        Date:  06/28/23                  EDD:   04/03/24  U/S Today:     31w 1d                                        EDD:   04/06/24  Best:          31w 4d     Det.  By:  LMP  (06/28/23)          EDD:   04/03/24 ---------------------------------------------------------------------- Anatomy  Cranium:               Previously seen        Aortic Arch:            Previously seen  Cavum:                 Previously seen        Ductal Arch:            Previously seen  Ventricles:            Appears normal         Diaphragm:              Appears normal  Choroid Plexus:        Previously seen        Stomach:                Appears normal, left                                                                        sided  Cerebellum:            Previously seen        Abdomen:                Previously seen  Posterior Fossa:       Previously seen        Abdominal Wall:         Previously seen  Face:                  Orbits and profile     Cord Vessels:           Previously seen                         previously seen  Lips:                  Previously seen        Kidneys:                Appear normal  Thoracic:              Appears normal         Bladder:                Appears normal  Heart:                 Appears normal         Spine:                  Previously seen                         (4CH, axis, and                         situs)  RVOT:                  Previously seen  Upper Extremities:      Previously seen  LVOT:                  Previously seen        Lower Extremities:      Previously seen  Other:  Technically difficult due to advanced GA and fetal position.  ---------------------------------------------------------------------- Doppler - Fetal Vessels  Umbilical Artery   S/D     %tile      RI    %tile      PI    %tile     PSV    ADFV    RDFV                                                     (cm/s)   3.06       70    0.67       73    1.08       80    47.65      No      No ---------------------------------------------------------------------- Cervix Uterus Adnexa  Cervix  Not visualized (advanced GA >24wks)  Uterus  No abnormality visualized. ---------------------------------------------------------------------- Comments  [name] is currently at 31 weeks and 4 days.  She has  been followed due to advanced maternal age and IUGR.  She denies any problems since her last exam and reports  feeling  fetal movements throughout the day.  She had a reactive NST today.  Sonographic findings  Single intrauterine pregnancy at 31w 4d.  Fetal cardiac activity:  Observed and appears normal.  Presentation: Breech.  Fetal biometry shows the estimated fetal weight of 3 pounds  10 ounces which measures at the 18th percentile.  Amniotic fluid volume: Within normal limits.  AFI: 13.57 cm.  MVP: 5. cm.  Placenta: Posterior.  Doppler studies of the umbilical arteries performed due to  fetal growth restriction showed a normal S/D ratio of 3.06.  There were no signs of absent or reversed end-diastolic flow  noted today.  As the previously noted IUGR has resolved, she will return for  another growth scan in 3 weeks.  She was advised that as long as the EFW remains above the  10th percentile during her future growth scans, delivery will  be recommended at between 39 to 40 weeks.  Should IUGR be noted again later in her pregnancy, delivery  will be recommended at between 37 to 38 weeks.  The patient stated that all of her questions were answered  today.  A total of 10 minutes was spent counseling and coordinating  the care for this patient.  Greater than 50% of the time was  spent in direct  face-to-face contact. ----------------------------------------------------------------------                   Steffan Keys, MD Electronically Signed Final Report   02/04/2024 10:40 am ----------------------------------------------------------------------   US  MFM OB LIMITED Result Date: 01/28/2024 ----------------------------------------------------------------------  OBSTETRICS REPORT                       (Signed Final 01/28/2024 09:12 am) ---------------------------------------------------------------------- Patient Info  ID #:       [id number]  D.O.B.:  03/08/1986 (37 yrs)(F)  Name:       SHAWONDA KERCE Allcock                 Visit Date: 01/28/2024 07:20 am ---------------------------------------------------------------------- Performed By  Attending:        Steffan Keys MD         Ref. Address:     [address]                                                             [address]  Performed By:     Emelia Coombs BS,       Location[facility]                    RDMS, RVT                                [facility]  Referred By:      [facility] ---------------------------------------------------------------------- Orders  #  Description                           Code        Ordered By  1  US  MFM OB LIMITED                     76815.01    NATHANEL FETTERS  2  US  MFM UA CORD DOPPLER                23179.97    NATHANEL FETTERS ----------------------------------------------------------------------  #  Order #                     Accession #  Episode #  1  [id number]                   [id number]                 [id number]  2  [id number]                   [id number]                 [id number]  ---------------------------------------------------------------------- Indications  Maternal care for known or suspected poor      O36.5930  fetal growth, third trimester, single or  unspecified fetus IUGR  Advanced maternal age multigravida 55+,        O37.523  third trimester  Obesity complicating pregnancy, third          O99.213  trimester (BMI 34)  Medical complication of pregnancy (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 Cord Insertion:      Previously seen  Amniotic Fluid  AFI FV:      Within normal limits  AFI Sum(cm)     %Tile       Largest Pocket(cm)  11.64           27          4.28  RUQ(cm)       RLQ(cm)       LUQ(cm)        LLQ(cm)  3.43          0             3.93           4.28 ---------------------------------------------------------------------- OB History  Blood Type:   A+  Gravidity:    5         Term:   4        Prem:   0        SAB:   0  TOP:          0       Ectopic:  0        Living: 4 ---------------------------------------------------------------------- Gestational Age  LMP:           30w 4d        Date:  06/28/23                  EDD:   04/03/24  Best:          30w 4d     Det. By:  LMP  (06/28/23)          EDD:   04/03/24 ---------------------------------------------------------------------- Anatomy  Cranium:               Previously seen  Aortic Arch:            Previously seen  Cavum:                 Previously seen        Ductal Arch:            Previously seen  Ventricles:            Appears normal         Diaphragm:              Appears  normal  Choroid Plexus:        Previously seen        Stomach:                Appears normal, left                                                                        sided  Cerebellum:            Previously seen        Abdomen:                Previously seen  Posterior Fossa:       Previously seen        Abdominal Wall:         Previously seen  Face:                  Orbits and profile     Cord Vessels:           Previously seen                         previously seen  Lips:                  Previously seen        Kidneys:                Appear normal  Thoracic:              Appears normal         Bladder:                Appears normal  Heart:                 Appears normal         Spine:                  Previously seen                         (4CH, axis, and                         situs)  RVOT:                  Previously seen        Upper Extremities:      Previously seen  LVOT:                  Previously seen        Lower Extremities:  Previously seen  Other:  Technically difficult due to advanced GA and fetal position. ---------------------------------------------------------------------- Doppler - Fetal Vessels  Umbilical Artery   S/D     %tile      RI    %tile      PI    %tile     PSV    ADFV    RDFV                                                     (cm/s)   3.04       64    0.67       68    1.09       78    40.35      No      No ---------------------------------------------------------------------- Cervix Uterus Adnexa  Cervix  Normal appearance by transabdominal scan  Uterus  No abnormality visualized.  Right Ovary  Within normal limits.  Left Ovary  Within normal limits.  Cul De Sac  No free fluid seen.  Adnexa  No abnormality visualized ---------------------------------------------------------------------- Comments  Izetta Gin is currently at 30 weeks and 4 days.  She has  been followed due to advanced maternal age and IUGR.   She denies any problems since her last exam and reports  feeling  vigorous fetal movements throughout the day.  She had a reactive NST today.  The total AFI was 11.64 cm.  Doppler studies of the umbilical arteries performed due to  fetal growth restriction showed a normal S/D ratio of 3.04.  There were no signs of absent or reversed end-diastolic flow  noted today.  The fetus was in the breech presentation.  Due to IUGR, we will continue to follow her with weekly fetal  testing and umbilical artery Doppler studies.  Should IUGR continue to be noted later in her pregnancy,  delivery will be recommended at around 38 weeks.  She will return in 1 week for an NST, growth scan, and  umbilical artery Doppler study.  The patient stated that all of her questions were answered  today.  A total of 10 minutes was spent counseling and coordinating  the care for this patient.  Greater than 50% of the time was  spent in direct face-to-face contact. ----------------------------------------------------------------------                   Steffan Keys, MD Electronically Signed Final Report   01/28/2024 09:12 am ----------------------------------------------------------------------   US  MFM UA CORD DOPPLER Result Date: 01/28/2024 ----------------------------------------------------------------------  OBSTETRICS REPORT                       (Signed Final 01/28/2024 09:12 am) ---------------------------------------------------------------------- Patient Info  ID #:       [id number]                          D.O.B.:  Jun 16, 1985 (37 yrs)(F)  Name:       BENJAMIN MERRIHEW Frommer                 Visit Date: 01/28/2024 07:20 am ---------------------------------------------------------------------- Performed By  Attending:        Steffan Keys MD         Ref. Address:     [address]  Performed By:     Emelia Coombs BS,       [facility]                    RD[facility], RVT                                [facility]                                                              [facility]  Referred By:      [facility] ---------------------------------------------------------------------- Orders  #  Description                           Code        Ordered By  1  US  MFM OB LIMITED                     23184.98    NATHANEL FETTERS  2  US  MFM UA CORD DOPPLER                76820.02    NATHANEL FETTERS ----------------------------------------------------------------------  #  Order #                     Accession #                Episode #  1  [id number]                   [id number]                 [id number]  2  [id number]                   [id number]                 [id number] ---------------------------------------------------------------------- Indications  Maternal care for known or suspected poor      O36.5930  fetal growth, third trimester, single or  unspecified fetus IUGR  Advanced maternal age multigravida 37+,        O10.523  third trimester  Obesity complicating pregnancy, third          O99.213  trimester (BMI 34)  Medical complication of pregnancy (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 Cord Insertion:      Previously seen  Amniotic Fluid  AFI FV:      Within normal limits  AFI Sum(cm)     %Tile       Largest Pocket(cm)  11.64           27          4.28  RUQ(cm)       RLQ(cm)       LUQ(cm)        LLQ(cm)  3.43           0             3.93           4.28 ---------------------------------------------------------------------- OB History  Blood Type:   A+  Gravidity:    5         Term:   4        Prem:   0        SAB:   0  TOP:          0       Ectopic:  0        Living: 4 ---------------------------------------------------------------------- Gestational Age  LMP:           30w 4d        Date:  06/28/23                  EDD:   04/03/24  Best:          30w 4d     Det. By:  LMP  (06/28/23)          EDD:   04/03/24 ---------------------------------------------------------------------- Anatomy  Cranium:               Previously seen        Aortic Arch:            Previously seen  Cavum:                 Previously seen        Ductal Arch:            Previously seen  Ventricles:            Appears normal         Diaphragm:              Appears normal  Choroid Plexus:        Previously seen        Stomach:  Appears normal, left                                                                        sided  Cerebellum:            Previously seen        Abdomen:                Previously seen  Posterior Fossa:       Previously seen        Abdominal Wall:         Previously seen  Face:                  Orbits and profile     Cord Vessels:           Previously seen                         previously seen  Lips:                  Previously seen        Kidneys:                Appear normal  Thoracic:              Appears normal         Bladder:                Appears normal  Heart:                 Appears normal         Spine:                  Previously seen                         (4CH, axis, and                         situs)  RVOT:                  Previously seen        Upper Extremities:      Previously seen  LVOT:                  Previously seen        Lower Extremities:      Previously seen  Other:  Technically difficult due to advanced GA and fetal position.  ---------------------------------------------------------------------- Doppler - Fetal Vessels  Umbilical Artery   S/D     %tile      RI    %tile      PI    %tile     PSV    ADFV    RDFV                                                     (cm/s)   3.04       64  0.67       68    1.09       78    40.35      No      No ---------------------------------------------------------------------- Cervix Uterus Adnexa  Cervix  Normal appearance by transabdominal scan  Uterus  No abnormality visualized.  Right Ovary  Within normal limits.  Left Ovary  Within normal limits.  Cul De Sac  No free fluid seen.  Adnexa  No abnormality visualized ---------------------------------------------------------------------- Comments  Izetta Gin is currently at 30 weeks and 4 days.  She has  been followed due to advanced maternal age and IUGR.   She denies any problems since her last exam and reports  feeling vigorous fetal movements throughout the day.  She had a reactive NST today.  The total AFI was 11.64 cm.  Doppler studies of the umbilical arteries performed due to  fetal growth restriction showed a normal S/D ratio of 3.04.  There were no signs of absent or reversed end-diastolic flow  noted today.  The fetus was in the breech presentation.  Due to IUGR, we will continue to follow her with weekly fetal  testing and umbilical artery Doppler studies.  Should IUGR continue to be noted later in her pregnancy,  delivery will be recommended at around 38 weeks.  She will return in 1 week for an NST, growth scan, and  umbilical artery Doppler study.  The patient stated that all of her questions were answered  today.  A total of 10 minutes was spent counseling and coordinating  the care for this patient.  Greater than 50% of the time was  spent in direct face-to-face contact. ----------------------------------------------------------------------                   Steffan Keys, MD Electronically Signed Final Report   01/28/2024 09:12 am  ----------------------------------------------------------------------   US  MFM OB FOLLOW UP Result Date: 01/13/2024 ----------------------------------------------------------------------  OBSTETRICS REPORT                        (Signed Final 01/13/2024 12:00 pm) ---------------------------------------------------------------------- Patient Info  ID #:       [id number]                          D.O.B.:  01-23-1986 (37 yrs)(F)  Name:       [name]                 Visit Date: 01/13/2024 10:37 am ---------------------------------------------------------------------- Performed By  Attending:        Nathanel Fetters      Ref. Address:      [address]                    [address]                                                              [address]  Performed By:     Erminio Gentry            Location[facility]          [facility]  RD[facility]                                      [facility] at                                                              [facility]  Referred By:      [facility] ---------------------------------------------------------------------- Orders  #  Description                           Code        Ordered By  1  US  MFM OB FOLLOW UP                   23183.98    NATHANEL FETTERS  2  US  MFM UA CORD DOPPLER                76820.02    NATHANEL FETTERS ----------------------------------------------------------------------  #  Order #                     Accession #                Episode #  1  [id number]                   [id number]                 [id number]  2  [id number]                   [id number]                 [id number] ---------------------------------------------------------------------- Indications  Maternal care for known or suspected poor       O36.5920  fetal growth, second trimester, single or  unspecified fetus IUGR  Advanced maternal age multigravida 23+,          O47.522  second trimester (37)  Medical complication of pregnancy (Maternal     O26.90  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 Cord Insertion:       Visualized  Amniotic Fluid  AFI FV:      Within normal limits  AFI Sum(cm)     %Tile       Largest Pocket(cm)  11.21           21          3.68  RUQ(cm)       RLQ(cm)       LUQ(cm)        LLQ(cm)  2.52          2.84          3.68           2.17 ---------------------------------------------------------------------- Biometry  BPD:      70.1  mm     G. Age:  28w 1d         29  %    CI:        69.36   %    70 - 86                                                          FL/HC:       18.3  %    18.8 - 20.6  HC:    268.73   mm     G. Age:  29w 2d         44  %    HC/AC:       1.20       1.05 - 1.21  AC:    224.27   mm     G. Age:  26w 6d          7  %    FL/BPD:      70.2  %    71 - 87  FL:       49.2  mm     G. Age:  26w 4d          3  %    FL/AC:       21.9  %    20 - 24  HUM:      45.6  mm     G. Age:  27w 0d         13  %  LV:        1.9  mm  Est. FW:    1020   gm     2 lb 4 oz      5  % ---------------------------------------------------------------------- OB History  Blood Type:   A+  Gravidity:    5         Term:   4        Prem:   0        SAB:   0  TOP:          0       Ectopic:  0        Living: 4 ---------------------------------------------------------------------- Gestational Age  LMP:           28w 3d        Date:  06/28/23                  EDD:    04/03/24  U/S Today:     27w 5d                                        EDD:   04/08/24  Best:          28w 3d     Det. By:  LMP  (06/28/23)          EDD:   04/03/24 ---------------------------------------------------------------------- Anatomy  Cranium:               Previously seen        Aortic Arch:            Previously seen  Cavum:                 Previously seen        Ductal Arch:            Previously seen  Ventricles:            Appears normal         Diaphragm:              Appears normal  Choroid Plexus:        Previously seen        Stomach:                Appears normal, left                                                                        sided  Cerebellum:            Previously seen        Abdomen:                Previously seen  Posterior Fossa:       Previously seen        Abdominal Wall:         Previously seen  Face:                  Orbits and profile     Cord Vessels:           Previously seen                         previously seen  Lips:                  Previously seen        Kidneys:                Appear normal  Thoracic:              Previously seen        Bladder:  Appears normal  Heart:                 Appears normal         Spine:                  Previously seen                         (4CH, axis, and                         situs)  RVOT:                  Previously seen        Upper Extremities:      Previously seen  LVOT:                  Previously seen        Lower Extremities:      Previously seen ---------------------------------------------------------------------- Doppler - Fetal Vessels  Umbilical Artery    S/D    %tile      RI    %tile      PI    %tile     PSV    ADFV    RDFV                                                     (cm/s)   2.92       47    0.66       52    1.01       54    42.12      No      No ---------------------------------------------------------------------- Cervix Uterus Adnexa  Cervix  Normal appearance by transabdominal scan  ---------------------------------------------------------------------- Impression  Follow up growth due to bilateral UTD resolved toay  Normal interval growth with measurements consistent with  fetal growth restriction noted today with an EFW 5%.  Good fetal movement and amniotic fluid volume  The UA Dopplers are normal without evidence of AEDF or  REDF.  NST today ---------------------------------------------------------------------- Recommendations  Continue weekly testing with UAD  Repeat growth in 3 weeks. ----------------------------------------------------------------------              Nathanel Fetters, MD Electronically Signed Final Report   01/13/2024 12:00 pm ----------------------------------------------------------------------   US  MFM UA CORD DOPPLER Result Date: 01/13/2024 ----------------------------------------------------------------------  OBSTETRICS REPORT                        (Signed Final 01/13/2024 12:00 pm) ---------------------------------------------------------------------- Patient Info  ID #:       [id number]                          D.O.B.:  Apr 28, 1985 (37 yrs)(F)  Name:       [name]                 Visit Date: 01/13/2024 10:37 am ---------------------------------------------------------------------- Performed By  Attending:        Nathanel Fetters      Ref. Address:      [address]                    [address]  [address]  Performed By:     Erminio Gentry            Location[facility]          [facility]                    [facility]                                      [facility] at                                                              [facility]  Referred By:      [facility] ---------------------------------------------------------------------- Orders  #  Description                           Code        Ordered By  1  US  MFM OB FOLLOW UP                   76816.01    NATHANEL FETTERS  2  US  MFM UA CORD DOPPLER                76820.02    NATHANEL FETTERS ----------------------------------------------------------------------  #  Order #                     Accession #                Episode #  1  [id number]                   [id number]                 [id number]  2  [id number]                   [id number]                 [id number] ---------------------------------------------------------------------- Indications  Maternal care for known or suspected poor       O36.5920  fetal growth, second trimester, single or  unspecified fetus IUGR  Advanced maternal age multigravida 100+,         O68.522  second trimester (36)  Medical complication of pregnancy (Maternal     O26.90  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 Cord Insertion:       Visualized  Amniotic Fluid  AFI FV:      Within normal limits  AFI Sum(cm)     %Tile       Largest Pocket(cm)  11.21           21          3.68  RUQ(cm)       RLQ(cm)       LUQ(cm)        LLQ(cm)  2.52          2.84          3.68           2.17 ---------------------------------------------------------------------- Biometry  BPD:      70.1  mm     G. Age:  28w 1d         29  %    CI:        69.36   %    70 - 86                                                          FL/HC:       18.3  %    18.8 - 20.6  HC:    268.73   mm     G. Age:  29w 2d         44  %    HC/AC:       1.20       1.05 -  1.21  AC:    224.27   mm     G. Age:  26w 6d          7  %    FL/BPD:      70.2  %    71 - 87  FL:       49.2  mm     G. Age:  26w 4d          3  %    FL/AC:       21.9  %    20 - 24  HUM:      45.6  mm     G. Age:  27w 0d         13  %  LV:        1.9  mm  Est. FW:    1020   gm     2 lb 4 oz      5  % ---------------------------------------------------------------------- OB History  Blood Type:   A+  Gravidity:  5         Term:   4        Prem:   0        SAB:   0  TOP:          0       Ectopic:  0        Living: 4 ---------------------------------------------------------------------- Gestational Age  LMP:           28w 3d        Date:  06/28/23                  EDD:   04/03/24  U/S Today:     27w 5d                                        EDD:   04/08/24  Best:          28w 3d     Det. By:  LMP  (06/28/23)          EDD:   04/03/24 ---------------------------------------------------------------------- Anatomy  Cranium:               Previously seen        Aortic Arch:            Previously seen  Cavum:                 Previously seen        Ductal Arch:            Previously seen  Ventricles:            Appears normal         Diaphragm:              Appears normal  Choroid Plexus:        Previously seen        Stomach:                Appears normal, left                                                                        sided  Cerebellum:            Previously seen        Abdomen:                Previously seen  Posterior Fossa:       Previously seen        Abdominal Wall:         Previously seen  Face:                  Orbits and profile     Cord Vessels:           Previously seen                         previously seen  Lips:                  Previously seen        Kidneys:  Appear normal  Thoracic:              Previously seen        Bladder:                Appears normal  Heart:                 Appears normal         Spine:                  Previously seen                         (4CH, axis, and                          situs)  RVOT:                  Previously seen        Upper Extremities:      Previously seen  LVOT:                  Previously seen        Lower Extremities:      Previously seen ---------------------------------------------------------------------- Doppler - Fetal Vessels  Umbilical Artery    S/D    %tile      RI    %tile      PI    %tile     PSV    ADFV    RDFV                                                     (cm/s)   2.92       47    0.66       52    1.01       54    42.12      No      No ---------------------------------------------------------------------- Cervix Uterus Adnexa  Cervix  Normal appearance by transabdominal scan ---------------------------------------------------------------------- Impression  Follow up growth due to bilateral UTD resolved toay  Normal interval growth with measurements consistent with  fetal growth restriction noted today with an EFW 5%.  Good fetal movement and amniotic fluid volume  The UA Dopplers are normal without evidence of AEDF or  REDF.  NST today ---------------------------------------------------------------------- Recommendations  Continue weekly testing with UAD  Repeat growth in 3 weeks. ----------------------------------------------------------------------              Nathanel Fetters, MD Electronically Signed Final Report   01/13/2024 12:00 pm ----------------------------------------------------------------------    Assessment and Plan:  Pregnancy: [id number] at [redacted]w[redacted]d 1. Multigravida of advanced maternal age in third trimester (Primary) 2. [redacted] weeks gestation of pregnancy 3. Supervision of other normal pregnancy, antepartum Resolved IUGR, will do follow up scan and follow recommendations as per MFM. Given information about RSV vaccine. Preterm labor symptoms and general obstetric precautions including but not limited to vaginal bleeding, contractions, leaking of fluid and fetal movement were reviewed in detail with the  patient. Please refer to After Visit Summary for other counseling recommendations.   Return in about 2 weeks (around 02/23/2024) for OFFICE OB VISIT (MD only).  Future Appointments  Date Time Provider Department Center  02/23/2024 10:35 AM Analisa Sledd, Gloris LABOR, MD [facility]  03/01/2024  2:00 PM [facility] PROVIDER 1 [facility]  03/01/2024  2:15 PM [facility] US4 [facility]    Gloris Hugger, MD

## 2024-02-09 NOTE — Patient Instructions (Signed)
 RSV Vaccination for Pregnant People  CDC recommends two ways to protect babies from getting very sick with Respiratory Syncytial Virus (RSV):  An RSV vaccination given during pregnancy  Pfizer's vaccine Verdis Frederickson) is recommended for use during pregnancy. It is given during RSV season to people who are 32 through [redacted] weeks pregnant.  Or, An RSV immunization given directly to infants and some older babies  Babies born to mothers who get RSV vaccine at least 2 weeks before delivery will have protection and, in most cases, should not need an RSV immunization later.    When is RSV season?  In most regions of the [place] RSV season starts in the fall and peaks in the winter, but the timing and severity of RSV season can vary from place to place and year to year.   The goal of maternal RSV vaccination is to protect babies from getting very sick with RSV during their first RSV season.  In most of the [place], this means maternal RSV vaccine will be given in September through January.  Who should get the maternal RSV vaccine?  People who are 85 through [redacted] weeks pregnant during September through January should get one dose of maternal RSV vaccine to protect their babies. RSV season can vary around the country.   How is the maternal RSV vaccine administered?  Maternal RSV vaccine is given as a shot into the mother's upper arm. Only a single dose (one shot) of maternal RSV vaccine is recommended.   It is not yet known whether another dose might be needed in later pregnancies.  How well does the maternal RSV vaccine work?  When someone gets RSV vaccine, their body responds by making a protein that protects against the virus that causes RSV. The process takes about 2 weeks. When a pregnant person gets RSV vaccine, their protective proteins (called antibodies) also pass to their baby. So, babies who are born at least 2 weeks after their mother gets RSV vaccine are protected  at birth, when infants are at the highest risk of severe RSV disease.   The vaccine can reduce a baby's risk of being hospitalized from RSV by 57% in the first six months after birth.  What are the possible side effects of the maternal RSV vaccine?  In the clinical trials, the side effects most often reported by pregnant people who received the maternal RSV vaccine were pain at the injection site, headache, muscle pain, and nausea.  Although not common, a dangerous high blood pressure condition called pre-eclampsia occurred in 1.8% of pregnant people who received the maternal RSV vaccine compared to 1.4% of pregnant people who received a placebo.  The clinical trials identified a small increase in the number of preterm births in vaccinated pregnant people. It is not clear if this is a true safety problem related to RSV vaccine or if this occurred for reasons unrelated to vaccination.  To reduce the potential risk of preterm birth and complications from RSV disease, FDA approved the maternal RSV vaccine for use during weeks 32 through 84 of pregnancy while additional studies are conducted.  FDA is requiring the manufacturer to do additional studies that will look more closely at the potential risk of preterm births and pregnancy-related high blood pressure issues in mothers, including pre-eclampsia.  Severe allergic reactions to vaccines are rare but can happen after any vaccine and can be life-threatening. If you see signs of a severe allergic reaction after vaccination (hives, swelling of the face  and throat, difficulty breathing, a fast heartbeat, dizziness, or weakness), seek immediate medical care by calling 911.  As with any medicine or vaccine there is a very remote chance of the vaccine causing other serious injury or death after vaccination.  Adverse events following vaccination should be reported to the Vaccine Adverse Event Reporting System (VAERS), even if it's not clear that the vaccine  caused the adverse event. You or your doctor can report an adverse event to [facility] and FDA through VAERS. If you need further assistance reporting to VAERS, please email [email or web address] [email or web address] or call [phone].  If you have any questions about side effects from the maternal RSV vaccine, talk with your healthcare provider.  Do I need a prescription for a maternal RSV vaccine?  Until the vaccine available in the office, you will need a prescription to take to a local pharmacy that is providing the vaccine.   How do I pay for the maternal RSV vaccine?  Most private health insurance plans cover the maternal RSV vaccine, but there may be a cost to you depending on your plan.  Contact your insurer to find out.  Medicaid Beginning January 05, 2022, most people with coverage from [facility] and [employer] [facility]) will be guaranteed coverage of all vaccines recommended by the Advisory Committee on Immunization Practice at no cost to them.   Source: [facility] for Immunization and Respiratory Diseases

## 2024-02-23 ENCOUNTER — Ambulatory Visit (INDEPENDENT_AMBULATORY_CARE_PROVIDER_SITE_OTHER): Payer: MEDICAID | Admitting: Obstetrics & Gynecology

## 2024-02-23 VITALS — BP 125/83 | HR 128 | Wt 212.0 lb

## 2024-02-23 DIAGNOSIS — O09523 Supervision of elderly multigravida, third trimester: Secondary | ICD-10-CM | POA: Diagnosis not present

## 2024-02-23 DIAGNOSIS — R000 Tachycardia, unspecified: Secondary | ICD-10-CM | POA: Diagnosis not present

## 2024-02-23 DIAGNOSIS — O365930 Maternal care for other known or suspected poor fetal growth, third trimester, not applicable or unspecified: Secondary | ICD-10-CM | POA: Diagnosis not present

## 2024-02-23 DIAGNOSIS — Z3A34 34 weeks gestation of pregnancy: Secondary | ICD-10-CM

## 2024-02-23 DIAGNOSIS — Z3480 Encounter for supervision of other normal pregnancy, unspecified trimester: Secondary | ICD-10-CM

## 2024-02-23 NOTE — Progress Notes (Signed)
 PRENATAL VISIT NOTE  Subjective:  [name] is a 38 y.o. [id number] at [redacted]w[redacted]d being seen today for ongoing prenatal care.  She is currently monitored for the following issues for this high-risk pregnancy and has S/P VSD repair; Anxiety and depression; Supervision of other normal pregnancy, antepartum; Advanced maternal age in multigravida; and Consultation for female sterilization on their problem list.  Patient reports no symptoms.  Contractions: Irritability. Vag. Bleeding: None.  Movement: Present. Denies leaking of fluid.   The following portions of the patient's history were reviewed and updated as appropriate: allergies, current medications, past family history, past medical history, past social history, past surgical history and problem list.   Objective:   Vitals:   02/23/24 1042  BP: 125/83  Pulse: (!) 128  Weight: 212 lb (96.2 kg)    Fetal Status:  Fetal Heart Rate (bpm): 142   Movement: Present    General: Alert, oriented and cooperative. Patient is in no acute distress.  Skin: Skin is warm and dry. No rash noted.   Cardiovascular: Normal heart rate noted  Respiratory: Normal respiratory effort, no problems with respiration noted  Abdomen: Soft, gravid, appropriate for gestational age.  Pain/Pressure: Present     Pelvic: Cervical exam deferred        Extremities: Normal range of motion.  Edema: None  Mental Status: Normal mood and affect. Normal behavior. Normal judgment and thought content.      01/12/2024    8:54 AM 09/22/2023   11:23 AM 04/09/2023    3:26 PM  Depression screen PHQ 2/9  Decreased Interest 0 0 0  Down, Depressed, Hopeless 0 0 1  PHQ - 2 Score 0 0 1  Altered sleeping 0 1 1  Tired, decreased energy 0 1 1  Change in appetite 0 0 0  Feeling bad or failure about yourself  0 0 0  Trouble concentrating 0 0 0  Moving slowly or fidgety/restless 0 0 0  Suicidal thoughts 0 0 0  PHQ-9 Score 0  2  3   Difficult doing work/chores  Not difficult at all  Not difficult at all     Data saved with a previous flowsheet row definition        01/12/2024    8:55 AM 09/22/2023   11:23 AM 04/09/2023    3:26 PM 03/12/2023    2:42 PM  GAD 7 : Generalized Anxiety Score  Nervous, Anxious, on Edge 1 0 1 0  Control/stop worrying 0 0 0 0  Worry too much - different things 0 0 0 0  Trouble relaxing 0 0 1 0  Restless 0 0 0 0  Easily annoyed or irritable 0 1 1 1   Afraid - awful might happen 0 0 0 0  Total GAD 7 Score 1 1 3 1   Anxiety Difficulty  Not difficult at all Not difficult at all Not difficult at all   US  MFM OB FOLLOW UP Result Date: 02/04/2024 ----------------------------------------------------------------------  OBSTETRICS REPORT                       (Signed Final 02/04/2024 10:40 am) ---------------------------------------------------------------------- Patient Info  ID #:       [id number]                          D.O.B.:  Nov 11, 1985 (37 yrs)(F)  Name:       [name]  Visit Date: 02/04/2024 07:16 am ---------------------------------------------------------------------- Performed By  Attending:        Steffan Keys MD         Ref. Address:     [address]                                                             [address]  Performed By:     Emelia Coombs BS,       [facility]                    RD[facility], RVT                                [facility]  Referred By:      [facility] ---------------------------------------------------------------------- Orders  #  Description                           Code        Ordered By  1  US  MFM OB FOLLOW UP                   23183.98    NATHANEL FETTERS  2  US  MFM UA CORD DOPPLER                23179.97    NATHANEL FETTERS  ----------------------------------------------------------------------  #  Order #                     Accession #                Episode #  1  [id number]                   [id number]  [id number]  2  [id number]                   [id number]                 [id number] ---------------------------------------------------------------------- Indications  Maternal care for known or suspected poor      O36.5930  fetal growth, third trimester, single or  unspecified fetus IUGR  [redacted] weeks gestation of pregnancy                Z3A.75  Advanced maternal age multigravida 42+,        O69.523  third trimester  Obesity complicating pregnancy, third          O99.213  trimester (BMI 34)  Medical complication of pregnancy (Maternal    O26.90  VSD repair as a child)  Pyelectasis of fetus on prenatal ultrasound    O28.3  (resolved)  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 Cord Insertion:      Previously seen  Amniotic Fluid  AFI FV:      Within normal limits  AFI Sum(cm)     %Tile       Largest Pocket(cm)  13.57           43          5  RUQ(cm)       RLQ(cm)       LUQ(cm)        LLQ(cm)  5             2.13          4.12           2.32 ---------------------------------------------------------------------- Biometry  BPD:      77.1  mm     G. Age:  31w 0d         22  %    CI:        68.95   %    70 - 86                                                          FL/HC:      18.5   %    19.1 - 21.3  HC:      296.6  mm     G. Age:  32w 6d         46  %    HC/AC:      1.08        0.96 - 1.17  AC:      275.3  mm     G. Age:  31w 4d         48  %    FL/BPD:  71.1   %    71 - 87  FL:        54.8  mm     G. Age:  29w 0d        < 1  %    FL/AC:      19.9   %    20 - 24  LV:        4.2  mm  Est. FW:    1650  gm    3 lb 10 oz      18  % ---------------------------------------------------------------------- OB History  Blood Type:   A+  Gravidity:    5         Term:   4        Prem:   0        SAB:   0  TOP:          0       Ectopic:  0        Living: 4 ---------------------------------------------------------------------- Gestational Age  LMP:           31w 4d        Date:  06/28/23                  EDD:   04/03/24  U/S Today:     31w 1d                                        EDD:   04/06/24  Best:          31w 4d     Det. By:  LMP  (06/28/23)          EDD:   04/03/24 ---------------------------------------------------------------------- Anatomy  Cranium:               Previously seen        Aortic Arch:            Previously seen  Cavum:                 Previously seen        Ductal Arch:            Previously seen  Ventricles:            Appears normal         Diaphragm:              Appears normal  Choroid Plexus:        Previously seen        Stomach:                Appears normal, left                                                                        sided  Cerebellum:            Previously seen        Abdomen:                Previously seen  Posterior Fossa:       Previously seen  Abdominal Wall:         Previously seen  Face:                  Orbits and profile     Cord Vessels:           Previously seen                         previously seen  Lips:                  Previously seen        Kidneys:                Appear normal  Thoracic:              Appears normal         Bladder:                Appears normal  Heart:                 Appears normal         Spine:                  Previously seen                         (4CH, axis, and                         situs)  RVOT:                  Previously seen        Upper Extremities:      Previously seen  LVOT:                  Previously  seen        Lower Extremities:      Previously seen  Other:  Technically difficult due to advanced GA and fetal position. ---------------------------------------------------------------------- Doppler - Fetal Vessels  Umbilical Artery   S/D     %tile      RI    %tile      PI    %tile     PSV    ADFV    RDFV                                                     (cm/s)   3.06       70    0.67       73    1.08       80    47.65      No      No ---------------------------------------------------------------------- Cervix Uterus Adnexa  Cervix  Not visualized (advanced GA >24wks)  Uterus  No abnormality visualized. ---------------------------------------------------------------------- Comments  Fujie Dickison is currently at 31 weeks and 4 days.  She has  been followed due to advanced maternal age and IUGR.  She denies any problems since her last exam and reports  feeling  fetal movements throughout the day.  She had a reactive NST today.  Sonographic findings  Single intrauterine pregnancy at 31w 4d.  Fetal cardiac activity:  Observed and appears normal.  Presentation: Breech.  Fetal biometry shows  the estimated fetal weight of 3 pounds  10 ounces which measures at the 18th percentile.  Amniotic fluid volume: Within normal limits.  AFI: 13.57 cm.  MVP: 5. cm.  Placenta: Posterior.  Doppler studies of the umbilical arteries performed due to  fetal growth restriction showed a normal S/D ratio of 3.06.  There were no signs of absent or reversed end-diastolic flow  noted today.  As the previously noted IUGR has resolved, she will return for  another growth scan in 3 weeks.  She was advised that as long as the EFW remains above the  10th percentile during her future growth scans, delivery will  be recommended at between 39 to 40 weeks.  Should IUGR be noted again later in her pregnancy, delivery  will be recommended at between 37 to 38 weeks.  The patient stated that all of her questions were answered  today.  A total of 10  minutes was spent counseling and coordinating  the care for this patient.  Greater than 50% of the time was  spent in direct face-to-face contact. ----------------------------------------------------------------------                   Steffan Keys, MD Electronically Signed Final Report   02/04/2024 10:40 am ----------------------------------------------------------------------   US  MFM UA CORD DOPPLER Result Date: 02/04/2024 ----------------------------------------------------------------------  OBSTETRICS REPORT                       (Signed Final 02/04/2024 10:40 am) ---------------------------------------------------------------------- Patient Info  ID #:       [id number]                          D.O.B.:  [birth year] (37 yrs)(F)  Name:       [name]                 Visit Date: 02/04/2024 07:16 am ---------------------------------------------------------------------- Performed By  Attending:        Steffan Keys MD         Ref. Address:     [address]                                                             [address]  Performed By:     Emelia Coombs BS,       Location[facility]                    [facility], RVT                                [facility]  Referred By:      [facility] ---------------------------------------------------------------------- Orders  #  Description                           Code        Ordered By  1  US  MFM OB FOLLOW UP                   [id number]    NATHANEL FETTERS  2  US  MFM UA CORD DOPPLER                23179.97    NATHANEL FETTERS ----------------------------------------------------------------------  #  Order #                     Accession #                Episode #  1  [id number]                    [id number]                 [id number]  2  [id number]                   [id number]                 [id number] ---------------------------------------------------------------------- Indications  Maternal care for known or suspected poor      O36.5930  fetal growth, third trimester, single or  unspecified fetus IUGR  [redacted] weeks gestation of pregnancy                Z3A.31  Advanced maternal age multigravida 41+,        O42.523  third trimester  Obesity complicating pregnancy, third          O99.213  trimester (BMI 34)  Medical complication of pregnancy (Maternal    O26.90  VSD repair as a child)  Pyelectasis of fetus on prenatal ultrasound    O28.3  (resolved)  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 Cord Insertion:      Previously seen  Amniotic Fluid  AFI FV:      Within normal limits  AFI Sum(cm)     %  Tile       Largest Pocket(cm)  13.57           43          5  RUQ(cm)       RLQ(cm)       LUQ(cm)        LLQ(cm)  5             2.13          4.12           2.32 ---------------------------------------------------------------------- Biometry  BPD:      77.1  mm     G. Age:  31w 0d         22  %    CI:        68.95   %    70 - 86                                                          FL/HC:      18.5   %    19.1 - 21.3  HC:      296.6  mm     G. Age:  32w 6d         46  %    HC/AC:      1.08        0.96 - 1.17  AC:      275.3  mm     G. Age:  31w 4d         48  %    FL/BPD:     71.1   %    71 - 87  FL:       54.8  mm     G. Age:  29w 0d        < 1  %    FL/AC:      19.9   %    20 - 24  LV:        4.2  mm  Est. FW:    1650  gm    3 lb 10 oz      18  %  ---------------------------------------------------------------------- OB History  Blood Type:   A+  Gravidity:    5         Term:   4        Prem:   0        SAB:   0  TOP:          0       Ectopic:  0        Living: 4 ---------------------------------------------------------------------- Gestational Age  LMP:           31w 4d        Date:  06/28/23                  EDD:   04/03/24  U/S Today:     31w 1d                                        EDD:   04/06/24  Best:          31w 4d     Det.  By:  LMP  (06/28/23)          EDD:   04/03/24 ---------------------------------------------------------------------- Anatomy  Cranium:               Previously seen        Aortic Arch:            Previously seen  Cavum:                 Previously seen        Ductal Arch:            Previously seen  Ventricles:            Appears normal         Diaphragm:              Appears normal  Choroid Plexus:        Previously seen        Stomach:                Appears normal, left                                                                        sided  Cerebellum:            Previously seen        Abdomen:                Previously seen  Posterior Fossa:       Previously seen        Abdominal Wall:         Previously seen  Face:                  Orbits and profile     Cord Vessels:           Previously seen                         previously seen  Lips:                  Previously seen        Kidneys:                Appear normal  Thoracic:              Appears normal         Bladder:                Appears normal  Heart:                 Appears normal         Spine:                  Previously seen                         (4CH, axis, and                         situs)  RVOT:                  Previously seen  Upper Extremities:      Previously seen  LVOT:                  Previously seen        Lower Extremities:      Previously seen  Other:  Technically difficult due to advanced GA and fetal position.  ---------------------------------------------------------------------- Doppler - Fetal Vessels  Umbilical Artery   S/D     %tile      RI    %tile      PI    %tile     PSV    ADFV    RDFV                                                     (cm/s)   3.06       70    0.67       73    1.08       80    47.65      No      No ---------------------------------------------------------------------- Cervix Uterus Adnexa  Cervix  Not visualized (advanced GA >24wks)  Uterus  No abnormality visualized. ---------------------------------------------------------------------- Comments  [name] is currently at 31 weeks and 4 days.  She has  been followed due to advanced maternal age and IUGR.  She denies any problems since her last exam and reports  feeling  fetal movements throughout the day.  She had a reactive NST today.  Sonographic findings  Single intrauterine pregnancy at 31w 4d.  Fetal cardiac activity:  Observed and appears normal.  Presentation: Breech.  Fetal biometry shows the estimated fetal weight of 3 pounds  10 ounces which measures at the 18th percentile.  Amniotic fluid volume: Within normal limits.  AFI: 13.57 cm.  MVP: 5. cm.  Placenta: Posterior.  Doppler studies of the umbilical arteries performed due to  fetal growth restriction showed a normal S/D ratio of 3.06.  There were no signs of absent or reversed end-diastolic flow  noted today.  As the previously noted IUGR has resolved, she will return for  another growth scan in 3 weeks.  She was advised that as long as the EFW remains above the  10th percentile during her future growth scans, delivery will  be recommended at between 39 to 40 weeks.  Should IUGR be noted again later in her pregnancy, delivery  will be recommended at between 37 to 38 weeks.  The patient stated that all of her questions were answered  today.  A total of 10 minutes was spent counseling and coordinating  the care for this patient.  Greater than 50% of the time was  spent in direct  face-to-face contact. ----------------------------------------------------------------------                   Steffan Keys, MD Electronically Signed Final Report   02/04/2024 10:40 am ----------------------------------------------------------------------   US  MFM OB LIMITED Result Date: 01/28/2024 ----------------------------------------------------------------------  OBSTETRICS REPORT                       (Signed Final 01/28/2024 09:12 am) ---------------------------------------------------------------------- Patient Info  ID #:       [id number]  D.O.B.:  1986/01/16 (37 yrs)(F)  Name:       [name]                 Visit Date: 01/28/2024 07:20 am ---------------------------------------------------------------------- Performed By  Attending:        Steffan Keys MD         Ref. Address:     [address]                                                             [address]  Performed By:     Emelia Coombs BS,       Location[facility]                    RDMS, RVT                                [facility]  Referred By:      [facility] ---------------------------------------------------------------------- Orders  #  Description                           Code        Ordered By  1  US  MFM OB LIMITED                     76815.01    NATHANEL FETTERS  2  US  MFM UA CORD DOPPLER                23179.97    NATHANEL FETTERS ----------------------------------------------------------------------  #  Order #                     Accession #  Episode #  1  [id number]                   [id number]                 [id number]  2  [id number]                   [id number]                 [id number]  ---------------------------------------------------------------------- Indications  Maternal care for known or suspected poor      O36.5930  fetal growth, third trimester, single or  unspecified fetus IUGR  Advanced maternal age multigravida 31+,        O53.523  third trimester  Obesity complicating pregnancy, third          O99.213  trimester (BMI 34)  Medical complication of pregnancy (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 Cord Insertion:      Previously seen  Amniotic Fluid  AFI FV:      Within normal limits  AFI Sum(cm)     %Tile       Largest Pocket(cm)  11.64           27          4.28  RUQ(cm)       RLQ(cm)       LUQ(cm)        LLQ(cm)  3.43          0             3.93           4.28 ---------------------------------------------------------------------- OB History  Blood Type:   A+  Gravidity:    5         Term:   4        Prem:   0        SAB:   0  TOP:          0       Ectopic:  0        Living: 4 ---------------------------------------------------------------------- Gestational Age  LMP:           30w 4d        Date:  06/28/23                  EDD:   04/03/24  Best:          30w 4d     Det. By:  LMP  (06/28/23)          EDD:   04/03/24 ---------------------------------------------------------------------- Anatomy  Cranium:               Previously seen  Aortic Arch:            Previously seen  Cavum:                 Previously seen        Ductal Arch:            Previously seen  Ventricles:            Appears normal         Diaphragm:              Appears  normal  Choroid Plexus:        Previously seen        Stomach:                Appears normal, left                                                                        sided  Cerebellum:            Previously seen        Abdomen:                Previously seen  Posterior Fossa:       Previously seen        Abdominal Wall:         Previously seen  Face:                  Orbits and profile     Cord Vessels:           Previously seen                         previously seen  Lips:                  Previously seen        Kidneys:                Appear normal  Thoracic:              Appears normal         Bladder:                Appears normal  Heart:                 Appears normal         Spine:                  Previously seen                         (4CH, axis, and                         situs)  RVOT:                  Previously seen        Upper Extremities:      Previously seen  LVOT:                  Previously seen        Lower Extremities:  Previously seen  Other:  Technically difficult due to advanced GA and fetal position. ---------------------------------------------------------------------- Doppler - Fetal Vessels  Umbilical Artery   S/D     %tile      RI    %tile      PI    %tile     PSV    ADFV    RDFV                                                     (cm/s)   3.04       64    0.67       68    1.09       78    40.35      No      No ---------------------------------------------------------------------- Cervix Uterus Adnexa  Cervix  Normal appearance by transabdominal scan  Uterus  No abnormality visualized.  Right Ovary  Within normal limits.  Left Ovary  Within normal limits.  Cul De Sac  No free fluid seen.  Adnexa  No abnormality visualized ---------------------------------------------------------------------- Comments  [name] is currently at 30 weeks and 4 days.  She has  been followed due to advanced maternal age and IUGR.   She denies any problems since her last exam and reports  feeling  vigorous fetal movements throughout the day.  She had a reactive NST today.  The total AFI was 11.64 cm.  Doppler studies of the umbilical arteries performed due to  fetal growth restriction showed a normal S/D ratio of 3.04.  There were no signs of absent or reversed end-diastolic flow  noted today.  The fetus was in the breech presentation.  Due to IUGR, we will continue to follow her with weekly fetal  testing and umbilical artery Doppler studies.  Should IUGR continue to be noted later in her pregnancy,  delivery will be recommended at around 38 weeks.  She will return in 1 week for an NST, growth scan, and  umbilical artery Doppler study.  The patient stated that all of her questions were answered  today.  A total of 10 minutes was spent counseling and coordinating  the care for this patient.  Greater than 50% of the time was  spent in direct face-to-face contact. ----------------------------------------------------------------------                   Steffan Keys, MD Electronically Signed Final Report   01/28/2024 09:12 am ----------------------------------------------------------------------   US  MFM UA CORD DOPPLER Result Date: 01/28/2024 ----------------------------------------------------------------------  OBSTETRICS REPORT                       (Signed Final 01/28/2024 09:12 am) ---------------------------------------------------------------------- Patient Info  ID #:       [id number]                          D.O.B.:  1985/10/12 (37 yrs)(F)  Name:       [name]                 Visit Date: 01/28/2024 07:20 am ---------------------------------------------------------------------- Performed By  Attending:        Steffan Keys MD         Ref. Address:     [address]  Performed By:     Emelia Coombs BS,       [facility]  Referred By:      [facility] ---------------------------------------------------------------------- Orders  #  Description                           Code        Ordered By  1  US  MFM OB LIMITED                     76815.01    NATHANEL FETTERS  2  US  MFM UA CORD DOPPLER                76820.02    NATHANEL FETTERS ----------------------------------------------------------------------  #  Order #                     Accession #                Episode #  1  [id number]                   [id number]                 [id number]  2  [id number]                   [id number]                 [id number] ---------------------------------------------------------------------- Indications  Maternal care for known or suspected poor      O36.5930  fetal growth, third trimester, single or  unspecified fetus IUGR  Advanced maternal age multigravida 21+,        O106.523  third trimester  Obesity complicating pregnancy, third          O99.213  trimester (BMI 34)  Medical complication of pregnancy (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 Cord Insertion:      Previously seen  Amniotic Fluid  AFI FV:      Within normal limits  AFI Sum(cm)     %Tile       Largest Pocket(cm)  11.64           27          4.28  RUQ(cm)       RLQ(cm)       LUQ(cm)        LLQ(cm)  3.43           0             3.93           4.28 ---------------------------------------------------------------------- OB History  Blood Type:   A+  Gravidity:    5         Term:   4        Prem:   0        SAB:   0  TOP:          0       Ectopic:  0        Living: 4 ---------------------------------------------------------------------- Gestational Age  LMP:           30w 4d        Date:  06/28/23                  EDD:   04/03/24  Best:          30w 4d     Det. By:  LMP  (06/28/23)          EDD:   04/03/24 ---------------------------------------------------------------------- Anatomy  Cranium:               Previously seen        Aortic Arch:            Previously seen  Cavum:                 Previously seen        Ductal Arch:            Previously seen  Ventricles:            Appears normal         Diaphragm:              Appears normal  Choroid Plexus:        Previously seen        Stomach:  Appears normal, left                                                                        sided  Cerebellum:            Previously seen        Abdomen:                Previously seen  Posterior Fossa:       Previously seen        Abdominal Wall:         Previously seen  Face:                  Orbits and profile     Cord Vessels:           Previously seen                         previously seen  Lips:                  Previously seen        Kidneys:                Appear normal  Thoracic:              Appears normal         Bladder:                Appears normal  Heart:                 Appears normal         Spine:                  Previously seen                         (4CH, axis, and                         situs)  RVOT:                  Previously seen        Upper Extremities:      Previously seen  LVOT:                  Previously seen        Lower Extremities:      Previously seen  Other:  Technically difficult due to advanced GA and fetal position.  ---------------------------------------------------------------------- Doppler - Fetal Vessels  Umbilical Artery   S/D     %tile      RI    %tile      PI    %tile     PSV    ADFV    RDFV                                                     (cm/s)   3.04       64  0.67       68    1.09       78    40.35      No      No ---------------------------------------------------------------------- Cervix Uterus Adnexa  Cervix  Normal appearance by transabdominal scan  Uterus  No abnormality visualized.  Right Ovary  Within normal limits.  Left Ovary  Within normal limits.  Cul De Sac  No free fluid seen.  Adnexa  No abnormality visualized ---------------------------------------------------------------------- Comments  [name] is currently at 30 weeks and 4 days.  She has  been followed due to advanced maternal age and IUGR.   She denies any problems since her last exam and reports  feeling vigorous fetal movements throughout the day.  She had a reactive NST today.  The total AFI was 11.64 cm.  Doppler studies of the umbilical arteries performed due to  fetal growth restriction showed a normal S/D ratio of 3.04.  There were no signs of absent or reversed end-diastolic flow  noted today.  The fetus was in the breech presentation.  Due to IUGR, we will continue to follow her with weekly fetal  testing and umbilical artery Doppler studies.  Should IUGR continue to be noted later in her pregnancy,  delivery will be recommended at around 38 weeks.  She will return in 1 week for an NST, growth scan, and  umbilical artery Doppler study.  The patient stated that all of her questions were answered  today.  A total of 10 minutes was spent counseling and coordinating  the care for this patient.  Greater than 50% of the time was  spent in direct face-to-face contact. ----------------------------------------------------------------------                   Steffan Keys, MD Electronically Signed Final Report   01/28/2024 09:12 am  ----------------------------------------------------------------------    Assessment and Plan:  Pregnancy: H4E5995 at [redacted]w[redacted]d 1. Poor fetal growth affecting management of mother in third trimester (resolved for now) 10/30 EFW 18%, repeat scan 11/25.  Will follow up results and MFM recommendations and manage accordingly.  2. Tachycardia with heart rate 121-140 beats per minute Patient denies any CP or SOB. She was told to go to MAU or ED if symptoms worsen.  3. Multigravida of advanced maternal age in third trimester 4. [redacted] weeks gestation of pregnancy 5. Supervision of other normal pregnancy, antepartum (Primary) No other concerns.  RSV vaccine next visit. Preterm labor symptoms and general obstetric precautions including but not limited to vaginal bleeding, contractions, leaking of fluid and fetal movement were reviewed in detail with the patient. Please refer to After Visit Summary for other counseling recommendations.   Return in about 2 weeks (around 03/08/2024) for Pelvic cultures, RSV vaccine, OFFICE OB VISIT (MD only).  Future Appointments  Date Time Provider Department Center  03/01/2024  2:00 PM [facility] PROVIDER 1 [facility]  03/01/2024  2:15 PM [facility] US4 [facility]  03/10/2024 10:35 AM Izell Harari, MD [facility]  03/17/2024 10:55 AM Izell Harari, MD [facility]  03/22/2024 10:35 AM Alizea Pell, Gloris LABOR, MD [facility]    Gloris Hugger, MD

## 2024-02-23 NOTE — Progress Notes (Signed)
 Paitent's O2 saturation is 97%, heart rate 112 sitting.  Provider aware.

## 2024-03-01 ENCOUNTER — Ambulatory Visit: Payer: MEDICAID

## 2024-03-01 ENCOUNTER — Other Ambulatory Visit: Payer: Self-pay | Admitting: Obstetrics

## 2024-03-01 ENCOUNTER — Other Ambulatory Visit: Payer: Self-pay | Admitting: *Deleted

## 2024-03-01 ENCOUNTER — Ambulatory Visit: Payer: MEDICAID | Attending: Obstetrics | Admitting: Maternal & Fetal Medicine

## 2024-03-01 DIAGNOSIS — Z3480 Encounter for supervision of other normal pregnancy, unspecified trimester: Secondary | ICD-10-CM

## 2024-03-01 DIAGNOSIS — Z3A35 35 weeks gestation of pregnancy: Secondary | ICD-10-CM

## 2024-03-01 DIAGNOSIS — O365930 Maternal care for other known or suspected poor fetal growth, third trimester, not applicable or unspecified: Secondary | ICD-10-CM | POA: Diagnosis present

## 2024-03-01 DIAGNOSIS — E669 Obesity, unspecified: Secondary | ICD-10-CM | POA: Diagnosis not present

## 2024-03-01 DIAGNOSIS — O09523 Supervision of elderly multigravida, third trimester: Secondary | ICD-10-CM

## 2024-03-01 DIAGNOSIS — O99213 Obesity complicating pregnancy, third trimester: Secondary | ICD-10-CM | POA: Diagnosis not present

## 2024-03-01 NOTE — Progress Notes (Signed)
 After review, MFM consult with provider is not indicated for today  Toni Nathanel Pipe, MD 03/01/2024 4:48 PM  [facility]

## 2024-03-10 ENCOUNTER — Ambulatory Visit: Payer: MEDICAID | Admitting: Obstetrics and Gynecology

## 2024-03-10 ENCOUNTER — Encounter: Payer: Self-pay | Admitting: Obstetrics and Gynecology

## 2024-03-10 ENCOUNTER — Other Ambulatory Visit (HOSPITAL_COMMUNITY)
Admission: RE | Admit: 2024-03-10 | Discharge: 2024-03-10 | Disposition: A | Payer: MEDICAID | Source: Ambulatory Visit | Attending: Obstetrics and Gynecology | Admitting: Obstetrics and Gynecology

## 2024-03-10 VITALS — BP 114/79 | HR 103 | Wt 214.6 lb

## 2024-03-10 DIAGNOSIS — Z87898 Personal history of other specified conditions: Secondary | ICD-10-CM | POA: Insufficient documentation

## 2024-03-10 DIAGNOSIS — Z3A36 36 weeks gestation of pregnancy: Secondary | ICD-10-CM

## 2024-03-10 DIAGNOSIS — O09523 Supervision of elderly multigravida, third trimester: Secondary | ICD-10-CM | POA: Diagnosis not present

## 2024-03-10 DIAGNOSIS — Z8774 Personal history of (corrected) congenital malformations of heart and circulatory system: Secondary | ICD-10-CM

## 2024-03-10 NOTE — Progress Notes (Signed)
 PRENATAL VISIT NOTE  Subjective:  [name] is a 38 y.o. H4E5995 at [redacted]w[redacted]d being seen today for ongoing prenatal care.  She is currently monitored for the following issues for this high-risk pregnancy and has S/P VSD repair; Anxiety and depression; Supervision of other normal pregnancy, antepartum; Advanced maternal age in multigravida; Consultation for female sterilization; and History of poor fetal growth on their problem list.  Patient reports no complaints.  Contractions: Irritability. Vag. Bleeding: None.  Movement: Present. Denies leaking of fluid.   The following portions of the patient's history were reviewed and updated as appropriate: allergies, current medications, past family history, past medical history, past social history, past surgical history and problem list.   Objective:   Vitals:   03/10/24 1043  BP: 114/79  Pulse: (!) 103  Weight: 214 lb 9.6 oz (97.3 kg)    Fetal Status:  Fetal Heart Rate (bpm): 148   Movement: Present Presentation: Vertex  General: Alert, oriented and cooperative. Patient is in no acute distress.  Skin: Skin is warm and dry. No rash noted.   Cardiovascular: Normal heart rate noted  Respiratory: Normal respiratory effort, no problems with respiration noted  Abdomen: Soft, gravid, appropriate for gestational age.  Pain/Pressure: Present     Pelvic: Cervical exam performed in the presence of a chaperone Dilation: 1.5 Effacement (%): 20 Station: Ballotable  Extremities: Normal range of motion.  Edema: None  Mental Status: Normal mood and affect. Normal behavior. Normal judgment and thought content.      01/12/2024    8:54 AM 09/22/2023   11:23 AM 04/09/2023    3:26 PM  Depression screen PHQ 2/9  Decreased Interest 0 0 0  Down, Depressed, Hopeless 0 0 1  PHQ - 2 Score 0 0 1  Altered sleeping 0 1 1  Tired, decreased energy 0 1 1  Change in appetite 0 0 0  Feeling bad or failure about yourself  0 0 0  Trouble concentrating 0 0 0  Moving  slowly or fidgety/restless 0 0 0  Suicidal thoughts 0 0 0  PHQ-9 Score 0  2  3   Difficult doing work/chores  Not difficult at all Not difficult at all     Data saved with a previous flowsheet row definition        01/12/2024    8:55 AM 09/22/2023   11:23 AM 04/09/2023    3:26 PM 03/12/2023    2:42 PM  GAD 7 : Generalized Anxiety Score  Nervous, Anxious, on Edge 1 0 1 0  Control/stop worrying 0 0 0 0  Worry too much - different things 0 0 0 0  Trouble relaxing 0 0 1 0  Restless 0 0 0 0  Easily annoyed or irritable 0 1 1 1   Afraid - awful might happen 0 0 0 0  Total GAD 7 Score 1 1 3 1   Anxiety Difficulty  Not difficult at all Not difficult at all Not difficult at all    Assessment and Plan:  Pregnancy: G5P4004 at [redacted]w[redacted]d 1. [redacted] weeks gestation of pregnancy (Primary) BTL paper signed 10/7 - Strep Gp B NAA - Cervicovaginal ancillary only  2. Multigravida of advanced maternal age in third trimester  3. History of poor fetal growth Resolved FGR. MFM recommends 38-39wk IOL. Pt set up for 11/20 at 1145pm. 11/25: efw 37%, 2547g, ac 58%, afi 15.5, bpp 8/8, cephalic 4. S/P VSD repair No current issues. Fetal echo neg  Preterm labor symptoms and general  obstetric precautions including but not limited to vaginal bleeding, contractions, leaking of fluid and fetal movement were reviewed in detail with the patient. Please refer to After Visit Summary for other counseling recommendations.   No follow-ups on file.  Future Appointments  Date Time Provider Department Center  03/17/2024 10:55 AM Izell Harari, MD [facility]  03/22/2024 10:35 AM Anyanwu, Gloris LABOR, MD [facility]  03/27/2024 12:00 AM [facility] SCHED ROOM [facility] None    Harari Izell, MD

## 2024-03-11 LAB — CERVICOVAGINAL ANCILLARY ONLY
Chlamydia: NEGATIVE
Comment: NEGATIVE
Comment: NORMAL
Neisseria Gonorrhea: NEGATIVE

## 2024-03-12 LAB — STREP GP B NAA: Strep Gp B NAA: NEGATIVE

## 2024-03-15 ENCOUNTER — Telehealth: Payer: MEDICAID | Admitting: Physician Assistant

## 2024-03-15 ENCOUNTER — Telehealth (HOSPITAL_COMMUNITY): Payer: Self-pay | Admitting: *Deleted

## 2024-03-15 ENCOUNTER — Encounter (HOSPITAL_COMMUNITY): Payer: Self-pay | Admitting: *Deleted

## 2024-03-15 DIAGNOSIS — K649 Unspecified hemorrhoids: Secondary | ICD-10-CM

## 2024-03-15 DIAGNOSIS — K625 Hemorrhage of anus and rectum: Secondary | ICD-10-CM

## 2024-03-15 MED ORDER — HYDROCORTISONE (PERIANAL) 2.5 % EX CREA: 1.0000 | TOPICAL_CREAM | Freq: Two times a day (BID) | CUTANEOUS | 0 refills | Status: DC

## 2024-03-15 NOTE — Progress Notes (Signed)
   Thank you for the details you included in the comment boxes. Those details are very helpful in determining the best course of treatment for you and help us  to provide the best care.Because of current pregnancy, we recommend that you schedule a Virtual Urgent Care video visit in order for the provider to better assess what is going on.  The provider will be able to give you a more accurate diagnosis and treatment plan if we can more freely discuss your symptoms and with the addition of a virtual examination.   If you change your visit to a video visit, we will bill your insurance (similar to an office visit) and you will not be charged for this e-Visit. You will be able to stay at home and speak with the first available [facility] advanced practice provider. The link to do a video visit is in the drop down Menu tab of your Welcome screen in MyChart.

## 2024-03-15 NOTE — Patient Instructions (Signed)
 [name], thank you for joining [name], PA-C for today's virtual visit.  While this provider is not your primary care provider (PCP), if your PCP is located in our provider database this encounter information will be shared with them immediately following your visit.   A [facility] MyChart account gives you access to today's visit and all your visits, tests, and labs performed at [facility]  click here if you don't have a [facility] MyChart account or go to [email or web address]  Consent: (Patient) [name] provided verbal consent for this virtual visit at the beginning of the encounter.  Current Medications:  Current Outpatient Medications:    aspirin  EC 81 MG tablet, Take 1 tablet (81 mg total) by mouth daily. Swallow whole., Disp: 30 tablet, Rfl: 12   magnesium  oxide (MAG-OX) 400 (240 Mg) MG tablet, Take 1 tablet (400 mg total) by mouth daily. (Patient taking differently: Take 400 mg by mouth as needed.), Disp: 30 tablet, Rfl: 11   Prenatal Vit-Fe Fumarate-FA (PRENATAL MULTIVITAMIN) TABS tablet, Take 1 tablet by mouth daily at 12 noon., Disp: , Rfl:    Medications ordered in this encounter:  No orders of the defined types were placed in this encounter.    *If you need refills on other medications prior to your next appointment, please contact your pharmacy*  Follow-Up: Call back or seek an in-person evaluation if the symptoms worsen or if the condition fails to improve as anticipated.  [facility] [phone]  Other Instructions Hemorrhoids Hemorrhoids are swollen veins that may form: In the butt (rectum). These are called internal hemorrhoids. Around the opening of the butt (anus). These are called external hemorrhoids. Most hemorrhoids do not cause very bad problems. They often get better with changes to your lifestyle and what you eat. What are the causes? Having trouble pooping (constipation) or watery poop  (diarrhea). Pushing too hard when you poop. Pregnancy. Being very overweight (obese). Sitting for too long. Riding a bike for a long time. Heavy lifting or other things that take a lot of effort. Anal sex. What are the signs or symptoms? Pain. Itching or soreness in the butt. Bleeding from the butt. Leaking poop. Swelling. One or more lumps around the opening of your butt. How is this treated? In most cases, hemorrhoids can be treated at home. You may be told to: Change what you eat. Make changes to your lifestyle. If these treatments do not help, you may need to have a procedure done. Your doctor may need to: Place rubber bands at the bottom of the hemorrhoids to make them fall off. Put medicine into the hemorrhoids to shrink them. Shine a type of light on the hemorrhoids to cause them to fall off. Do surgery to get rid of the hemorrhoids. Follow these instructions at home: Medicines Take over-the-counter and prescription medicines only as told by your doctor. Use creams with medicine in them or medicines that you put in your butt as told by your doctor. Eating and drinking  Eat foods that have a lot of fiber in them. These include whole grains, beans, nuts, fruits, and vegetables. Ask your doctor about taking products that have fiber added to them (fibersupplements). Take in less fat. You can do this by: Eating low-fat dairy products. Eating less red meat. Staying away from processed foods. Drink enough fluid to keep your pee (urine) pale yellow. Managing pain and swelling  Take a warm-water bath (sitz bath) for 20 minutes to  ease pain. Do this 3-4 times a day. You may do this in a bathtub. You may also use a portable sitz bath that fits over the toilet. If told, put ice on the painful area. It may help to use ice between your warm baths. Put ice in a plastic bag. Place a towel between your skin and the bag. Leave the ice on for 20 minutes, 2-3 times a day. If your skin  turns bright red, take off the ice right away to prevent skin damage. The risk of damage is higher if you cannot feel pain, heat, or cold. General instructions Exercise. Ask your doctor how much and what kind of exercise is best for you. Go to the bathroom when you need to poop. Do not wait. Try not to push too hard when you poop. Keep your butt dry and clean. Use wet toilet paper or moist towelettes after you poop. Do not sit on the toilet for a long time. Contact a doctor if: You have pain and swelling that do not get better with treatment. You have trouble pooping. You cannot poop. You have pain or swelling outside the area of the hemorrhoids. Get help right away if: You have bleeding from the butt that will not stop. This information is not intended to replace advice given to you by your health care provider. Make sure you discuss any questions you have with your health care provider. Document Revised: 12/04/2021 Document Reviewed: 12/04/2021 Elsevier Patient Education  2024 Elsevier Inc.   If you have been instructed to have an in-person evaluation today at a local Urgent Care facility, please use the link below. It will take you to a list of all of our available [facility], including address, phone number and hours of operation. Please do not delay care.  [facility]  If you or a family member do not have a primary care provider, use the link below to schedule a visit and establish care. When you choose a [facility] primary care physician or advanced practice provider, you gain a long-term partner in health. Find a Primary Care Provider  Learn more about [facility]'s in-office and virtual care options: [facility] - Get Care Now

## 2024-03-15 NOTE — Telephone Encounter (Signed)
 Preadmission screen

## 2024-03-15 NOTE — Progress Notes (Signed)
 Virtual Visit Consent   [name], you are scheduled for a virtual visit with a [facility] provider today. Just as with appointments in the office, your consent must be obtained to participate. Your consent will be active for this visit and any virtual visit you may have with one of our providers in the next 365 days. If you have a MyChart account, a copy of this consent can be sent to you electronically.  As this is a virtual visit, video technology does not allow for your provider to perform a traditional examination. This may limit your provider's ability to fully assess your condition. If your provider identifies any concerns that need to be evaluated in person or the need to arrange testing (such as labs, EKG, etc.), we will make arrangements to do so. Although advances in technology are sophisticated, we cannot ensure that it will always work on either your end or our end. If the connection with a video visit is poor, the visit may have to be switched to a telephone visit. With either a video or telephone visit, we are not always able to ensure that we have a secure connection.  By engaging in this virtual visit, you consent to the provision of healthcare and authorize for your insurance to be billed (if applicable) for the services provided during this visit. Depending on your insurance coverage, you may receive a charge related to this service.  I need to obtain your verbal consent now. Are you willing to proceed with your visit today? [name] has provided verbal consent on 03/15/2024 for a virtual visit (video or telephone). [name], [place]  Date: 03/15/2024 3:38 PM   Virtual Visit via Video Note   I, [name], connected with  SHILYNN HOCH  ([id number], 1985/11/07) on 03/15/24 at  3:30 PM EST by a video-enabled telemedicine application and verified that I am speaking with the correct person using two identifiers.  Location: Patient: Virtual Visit  Location Patient: Home Provider: Virtual Visit Location Provider: Home Office   I discussed the limitations of evaluation and management by telemedicine and the availability of in person appointments. The patient expressed understanding and agreed to proceed.    History of Present Illness: [name] is a 38 y.o. who identifies as a female who was assigned female at birth, and is being seen today for concern of hemorrhoid, external. Notes last Thursday feeling a soft compressible hemorrhoid aroudn the anus. Slightly bothersome. Had continued next day with some discomfort so starting OTC preparation H and Tucks. Today with bleeding with a bowel movement (BRBPR). Denies tenesmus, constipation or straining. This has resolved since the one episode earlier. Is currently [redacted] weeks pregnant.  HPI: HPI  Problems:  Patient Active Problem List   Diagnosis Date Noted   History of poor fetal growth 03/10/2024   Consultation for female sterilization 09/22/2023   Supervision of other normal pregnancy, antepartum 09/08/2023   Advanced maternal age in multigravida 09/08/2023   Anxiety and depression 05/21/2021   S/P VSD repair     Allergies: No Known Allergies Medications:  Current Outpatient Medications:    hydrocortisone  (ANUSOL -HC) 2.5 % rectal cream, Place 1 Application rectally 2 (two) times daily., Disp: 30 g, Rfl: 0   aspirin  EC 81 MG tablet, Take 1 tablet (81 mg total) by mouth daily. Swallow whole., Disp: 30 tablet, Rfl: 12   magnesium  oxide (MAG-OX) 400 (240 Mg) MG tablet, Take 1 tablet (400 mg total) by mouth  daily. (Patient taking differently: Take 400 mg by mouth as needed.), Disp: 30 tablet, Rfl: 11   Prenatal Vit-Fe Fumarate-FA (PRENATAL MULTIVITAMIN) TABS tablet, Take 1 tablet by mouth daily at 12 noon., Disp: , Rfl:   Observations/Objective: Patient is well-developed, well-nourished in no acute distress.  Resting comfortably at home.  Head is normocephalic, atraumatic.  No labored  breathing.  Speech is clear and coherent with logical content.  Patient is alert and oriented at baseline.   Assessment and Plan: 1. Hemorrhoids, unspecified hemorrhoid type (Primary) - hydrocortisone  (ANUSOL -HC) 2.5 % rectal cream; Place 1 Application rectally 2 (two) times daily.  Dispense: 30 g; Refill: 0  Supportive measures and OTC medications reviewed. Since symptomatic and not helped substantially with conservative measures, will start short course of hydrocortisone  rectal cream. Has follow-up in a few days with OBGYN. Will have her follow-up with them as scheduled. UC/ER precautions reviewed.  Follow Up Instructions: I discussed the assessment and treatment plan with the patient. The patient was provided an opportunity to ask questions and all were answered. The patient agreed with the plan and demonstrated an understanding of the instructions.  A copy of instructions were sent to the patient via MyChart unless otherwise noted below.   The patient was advised to call back or seek an in-person evaluation if the symptoms worsen or if the condition fails to improve as anticipated.    Toni Velma Lunger, PA-C

## 2024-03-17 ENCOUNTER — Other Ambulatory Visit (INDEPENDENT_AMBULATORY_CARE_PROVIDER_SITE_OTHER): Payer: MEDICAID

## 2024-03-17 ENCOUNTER — Ambulatory Visit: Payer: MEDICAID | Admitting: *Deleted

## 2024-03-17 ENCOUNTER — Ambulatory Visit: Payer: MEDICAID | Admitting: Obstetrics and Gynecology

## 2024-03-17 VITALS — BP 127/79 | HR 111 | Wt 214.0 lb

## 2024-03-17 DIAGNOSIS — Z3480 Encounter for supervision of other normal pregnancy, unspecified trimester: Secondary | ICD-10-CM

## 2024-03-17 DIAGNOSIS — Z87898 Personal history of other specified conditions: Secondary | ICD-10-CM | POA: Diagnosis not present

## 2024-03-17 DIAGNOSIS — O09523 Supervision of elderly multigravida, third trimester: Secondary | ICD-10-CM | POA: Diagnosis not present

## 2024-03-17 DIAGNOSIS — Z3A37 37 weeks gestation of pregnancy: Secondary | ICD-10-CM

## 2024-03-17 NOTE — Progress Notes (Signed)

## 2024-03-17 NOTE — Progress Notes (Signed)
 PRENATAL VISIT NOTE  Subjective:  [name] is a 38 y.o. H4E5995 at [redacted]w[redacted]d being seen today for ongoing prenatal care.  She is currently monitored for the following issues for this high-risk pregnancy and has S/P VSD repair; Anxiety and depression; Supervision of other normal pregnancy, antepartum; Advanced maternal age in multigravida; Consultation for female sterilization; and History of poor fetal growth on their problem list.  Patient reports no complaints.  Contractions: Irritability. Vag. Bleeding: None.  Movement: Present. Denies leaking of fluid.   The following portions of the patient's history were reviewed and updated as appropriate: allergies, current medications, past family history, past medical history, past social history, past surgical history and problem list.   Objective:   Vitals:   03/17/24 0932  BP: 127/79  Pulse: (!) 111  Weight: 214 lb (97.1 kg)    Fetal Status:  Fetal Heart Rate (bpm): NST   Movement: Present    General: Alert, oriented and cooperative. Patient is in no acute distress.  Skin: Skin is warm and dry. No rash noted.   Cardiovascular: Normal heart rate noted  Respiratory: Normal respiratory effort, no problems with respiration noted  Abdomen: Soft, gravid, appropriate for gestational age.  Pain/Pressure: Present     Pelvic: Cervical exam performed in the presence of a chaperone        Extremities: Normal range of motion.     Mental Status: Normal mood and affect. Normal behavior. Normal judgment and thought content.      03/17/2024    9:40 AM 01/12/2024    8:54 AM 09/22/2023   11:23 AM  Depression screen PHQ 2/9  Decreased Interest 0 0 0  Down, Depressed, Hopeless 0 0 0  PHQ - 2 Score 0 0 0  Altered sleeping 1 0 1  Tired, decreased energy 1 0 1  Change in appetite 0 0 0  Feeling bad or failure about yourself  0 0 0  Trouble concentrating 0 0 0  Moving slowly or fidgety/restless 0 0 0  Suicidal thoughts 0 0 0  PHQ-9 Score 2 0  2    Difficult doing work/chores   Not difficult at all     Data saved with a previous flowsheet row definition        03/17/2024    9:41 AM 01/12/2024    8:55 AM 09/22/2023   11:23 AM 04/09/2023    3:26 PM  GAD 7 : Generalized Anxiety Score  Nervous, Anxious, on Edge 0 1 0 1  Control/stop worrying 0 0 0 0  Worry too much - different things 0 0 0 0  Trouble relaxing 0 0 0 1  Restless 0 0 0 0  Easily annoyed or irritable 1 0 1 1  Afraid - awful might happen 0 0 0 0  Total GAD 7 Score 1 1 1 3   Anxiety Difficulty   Not difficult at all Not difficult at all    Assessment and Plan:  Pregnancy: G5P4004 at [redacted]w[redacted]d 1. [redacted] weeks gestation of pregnancy (Primary) GBS neg BTL paper signed 10/7  2. Multigravida of advanced maternal age in third trimester  3. History of poor fetal growth Resolved FGR. MFM recommends 38-39wk IOL. Pt set up for 11/20 at 1145pm. Bpp 10/10, cephalic, afi 14 continue with weekly testing  11/25: efw 37%, 2547g, ac 58%, afi 15.5, bpp 8/8, cephalic  4. S/P VSD repair No current issues. Fetal echo neg  Term labor symptoms and general obstetric precautions including but not limited  to vaginal bleeding, contractions, leaking of fluid and fetal movement were reviewed in detail with the patient. Please refer to After Visit Summary for other counseling recommendations.   No follow-ups on file.  Future Appointments  Date Time Provider Department Center  03/17/2024 10:55 AM Izell Harari, MD [facility]  03/22/2024  9:35 AM [facility] NST [facility]  03/22/2024 10:35 AM Anyanwu, Gloris LABOR, MD [facility]  03/27/2024 12:00 AM [facility] SCHED ROOM [facility] None    Harari Izell, MD

## 2024-03-22 ENCOUNTER — Ambulatory Visit: Payer: MEDICAID

## 2024-03-22 ENCOUNTER — Ambulatory Visit (INDEPENDENT_AMBULATORY_CARE_PROVIDER_SITE_OTHER): Payer: MEDICAID | Admitting: Obstetrics & Gynecology

## 2024-03-22 ENCOUNTER — Other Ambulatory Visit: Payer: MEDICAID

## 2024-03-22 VITALS — BP 117/80 | HR 105 | Wt 215.0 lb

## 2024-03-22 DIAGNOSIS — Z87898 Personal history of other specified conditions: Secondary | ICD-10-CM

## 2024-03-22 DIAGNOSIS — Z3A38 38 weeks gestation of pregnancy: Secondary | ICD-10-CM

## 2024-03-22 DIAGNOSIS — O09523 Supervision of elderly multigravida, third trimester: Secondary | ICD-10-CM

## 2024-03-22 DIAGNOSIS — Z3480 Encounter for supervision of other normal pregnancy, unspecified trimester: Secondary | ICD-10-CM

## 2024-03-22 NOTE — Progress Notes (Signed)
 PRENATAL VISIT NOTE  Subjective:  [name] is a 38 y.o. H4E5995 at [redacted]w[redacted]d being seen today for ongoing prenatal care.  She is currently monitored for the following issues for this low-risk pregnancy and has S/P VSD repair; Anxiety and depression; Supervision of other normal pregnancy, antepartum; Advanced maternal age in multigravida; Consultation for female sterilization; and History of poor fetal growth on their problem list.  Patient reports no complaints.  Contractions: Irritability. Vag. Bleeding: None.  Movement: Present. Denies leaking of fluid.   The following portions of the patient's history were reviewed and updated as appropriate: allergies, current medications, past family history, past medical history, past social history, past surgical history and problem list.   Objective:   Vitals:   03/22/24 0949  BP: 117/80  Pulse: (!) 105  Weight: 215 lb (97.5 kg)    Fetal Status:  Fetal Heart Rate (bpm): NST   Movement: Present Presentation: Vertex  General: Alert, oriented and cooperative. Patient is in no acute distress.  Skin: Skin is warm and dry. No rash noted.   Cardiovascular: Normal heart rate noted  Respiratory: Normal respiratory effort, no problems with respiration noted  Abdomen: Soft, gravid, appropriate for gestational age.  Pain/Pressure: Present     Pelvic: Cervical exam performed in the presence of a chaperone Basilio Baller, CMA) Dilation: 4 Effacement (%): 50 Station: -2  Extremities: Normal range of motion.  Edema: Trace  Mental Status: Normal mood and affect. Normal behavior. Normal judgment and thought content.      03/17/2024    9:40 AM 01/12/2024    8:54 AM 09/22/2023   11:23 AM  Depression screen PHQ 2/9  Decreased Interest 0 0 0  Down, Depressed, Hopeless 0 0 0  PHQ - 2 Score 0 0 0  Altered sleeping 1 0 1  Tired, decreased energy 1 0 1  Change in appetite 0 0 0  Feeling bad or failure about yourself  0 0 0  Trouble concentrating 0 0 0   Moving slowly or fidgety/restless 0 0 0  Suicidal thoughts 0 0 0  PHQ-9 Score 2 0  2   Difficult doing work/chores   Not difficult at all     Data saved with a previous flowsheet row definition        03/17/2024    9:41 AM 01/12/2024    8:55 AM 09/22/2023   11:23 AM 04/09/2023    3:26 PM  GAD 7 : Generalized Anxiety Score  Nervous, Anxious, on Edge 0 1 0 1  Control/stop worrying 0 0 0 0  Worry too much - different things 0 0 0 0  Trouble relaxing 0 0 0 1  Restless 0 0 0 0  Easily annoyed or irritable 1 0 1 1  Afraid - awful might happen 0 0 0 0  Total GAD 7 Score 1 1 1 3   Anxiety Difficulty   Not difficult at all Not difficult at all   US  FETAL BPP W/NONSTRESS Result Date: 03/17/2024 ----------------------------------------------------------------------  OBSTETRICS REPORT                       (Signed Final 03/17/2024 02:03 pm) ---------------------------------------------------------------------- Patient Info  ID #:       [id number]                          D.O.B.:  04-16-1985 (38 yrs)(F)  Name:       [name]  Visit Date: 03/17/2024 10:09 am ---------------------------------------------------------------------- Performed By  Attending:        Bebe Furry MD     Ref. Address:     [address]                                                             [address]  Performed By:     Wanda Buckles RN     [facility]  Referred By:      [facility] Correne Beagle ---------------------------------------------------------------------- Orders  #  Description                           Code        Ordered By  1  US  FETAL BPP W/NONSTRESS              23181.5     BEBE FURRY ----------------------------------------------------------------------  #  Order #                      Accession #                Episode #  1  [id number]                   [id number]                 [id number] ---------------------------------------------------------------------- Indications  [redacted] weeks gestation of pregnancy                Z3A.37  History of FGR ---------------------------------------------------------------------- Fetal Evaluation  Num Of Fetuses:         1  Cardiac Activity:       Observed  Presentation:           Cephalic  Amniotic Fluid  AFI FV:      Within normal limits  AFI Sum(cm)     %Tile       Largest Pocket(cm)  13.99           53          5.15  RUQ(cm)       RLQ(cm)  LUQ(cm)        LLQ(cm)  5.15          4.38          2.78           1.68 ---------------------------------------------------------------------- Biophysical Evaluation  Amniotic F.V:   Within normal limits       F. Tone:        Observed  F. Movement:    Observed                   N.S.T:          Reactive  F. Breathing:   Observed                   Score:          10/10 ---------------------------------------------------------------------- OB History  Blood Type:   A+  Gravidity:    5         Term:   4        Prem:   0        SAB:   0  TOP:          0       Ectopic:  0        Living: 4 ---------------------------------------------------------------------- Gestational Age  LMP:           37w 4d        Date:  06/28/23                  EDD:   04/03/24  Best:          37w 4d     Det. By:  LMP  (06/28/23)          EDD:   04/03/24 ---------------------------------------------------------------------- Impression  Reassuring testing ---------------------------------------------------------------------- Recommendations  Continue weekly testing until delivery ----------------------------------------------------------------------                 Bebe Furry, MD Electronically Signed Final Report   03/17/2024 02:03 pm ----------------------------------------------------------------------   US  MFM OB FOLLOW UP Result  Date: 03/01/2024 ----------------------------------------------------------------------  OBSTETRICS REPORT                       (Signed Final 03/01/2024 02:26 pm) ---------------------------------------------------------------------- Patient Info  ID #:       [id number]                          D.O.B.:  04-11-1985 (38 yrs)(F)  Name:       [name]                 Visit Date: 03/01/2024 11:04 am ---------------------------------------------------------------------- Performed By  Attending:        Nathanel Fetters      Ref. Address:     [address]                    [address]                                                             [address]  Performed By:     Rumaldo Sharps RDMS      [facility]  Referred By:      [facility] ---------------------------------------------------------------------- Orders  #  Description                           Code        Ordered By  1  US  MFM OB FOLLOW UP                   [phone]    BABARA KEYS ----------------------------------------------------------------------  #  Order #                     Accession #                Episode #  1  [id number]                   [id number]                 [id number] ---------------------------------------------------------------------- Indications  Advanced maternal age multigravida 58+,        O81.523  third trimester (38 yo)  Obesity complicating pregnancy, third          O99.213  trimester (BMI 34)  Medical complication of pregnancy (Maternal    O26.90  VSD repair as a child)  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)                    Z14.8  Maternal care for known or suspected poor      O36.5930  fetal growth, third trimester, single fetus  IUGR (RESOLVED)  Pyelectasis of fetus on prenatal ultrasound    O28.3   (resolved)  Low risk NIPS NEG AFP  Encounter for other antenatal screening        Z36.2  follow-up  [redacted] weeks gestation of pregnancy                Z3A.35 ---------------------------------------------------------------------- Vital Signs  BP:          139/70 ---------------------------------------------------------------------- Fetal Evaluation  Num Of Fetuses:         1  Fetal Heart Rate(bpm):  146  Cardiac Activity:       Observed  Presentation:           Cephalic  Placenta:               Posterior  P. Cord Insertion:      Previously seen  Amniotic Fluid  AFI FV:      Within normal limits  AFI Sum(cm)     %Tile       Largest Pocket(cm)  15.5            56  7.11  RUQ(cm)       RLQ(cm)       LUQ(cm)        LLQ(cm)  4.81          7.11          3.58           0 ---------------------------------------------------------------------- Biophysical Evaluation  Amniotic F.V:   Within normal limits       F. Tone:        Observed  F. Movement:    Observed                   Score:          8/8  F. Breathing:   Observed ---------------------------------------------------------------------- Biometry  BPD:      86.8  mm     G. Age:  35w 0d         46  %    CI:        71.94   %    70 - 86                                                          FL/HC:      19.8   %    20.1 - 22.3  HC:      325.7  mm     G. Age:  36w 6d         58  %    HC/AC:      1.04        0.93 - 1.11  AC:      313.7  mm     G. Age:  35w 2d         58  %    FL/BPD:     74.4   %    71 - 87  FL:       64.6  mm     G. Age:  33w 2d          6  %    FL/AC:      20.6   %    20 - 24  LV:        4.6  mm  Est. FW:    2547  gm    5 lb 10 oz      37  %  Est. FW at 39 Wks:       3291  gm     7 lb 4 oz ---------------------------------------------------------------------- OB History  Blood Type:   A+  Gravidity:    5         Term:   4        Prem:   0        SAB:   0  TOP:          0       Ectopic:  0        Living: 4  ---------------------------------------------------------------------- Gestational Age  LMP:           35w 2d        Date:  06/28/23                  EDD:   04/03/24  U/S Today:     35w 1d  EDD:   04/04/24  Best:          35w 2d     Det. By:  LMP  (06/28/23)          EDD:   04/03/24 ---------------------------------------------------------------------- Anatomy  Cranium:               Previously seen        Aortic Arch:            Previously seen  Cavum:                 Previously seen        Ductal Arch:            Previously seen  Ventricles:            Appears normal         Diaphragm:              Appears normal  Choroid Plexus:        Previously seen        Stomach:                Appears normal, left                                                                        sided  Cerebellum:            Previously seen        Abdomen:                Previously seen  Posterior Fossa:       Previously seen        Abdominal Wall:         Previously seen  Face:                  Orbits and profile     Cord Vessels:           Previously seen                         previously seen  Lips:                  Previously seen        Kidneys:                Appear normal  Thoracic:              Previously seen        Bladder:                Appears normal  Heart:                 Appears normal         Spine:                  Previously seen                         (4CH, axis, and                         situs)  RVOT:  Previously seen        Upper Extremities:      Previously seen  LVOT:                  Previously seen        Lower Extremities:      Previously seen ---------------------------------------------------------------------- Cervix Uterus Adnexa  Cervix  Not visualized (advanced GA >24wks)  Uterus  No abnormality visualized.  Right Ovary  Not visualized.  Left Ovary  Not visualized.  Cul De Sac  No free fluid seen.  Adnexa  No abnormality visualized  ---------------------------------------------------------------------- Impression  Follow up growth due to prior FGR (two exams prior) She has  had two normal growth results.  Normal interval growth with measurements consistent with  dates  Good fetal movement and amniotic fluid volume  Biophysical profile 8/8 ---------------------------------------------------------------------- Recommendations  Continue weekly testing  Consider delivery between 38-39 weeks (given prior FGR this  pregnancy) ----------------------------------------------------------------------               Nathanel Fetters, MD Electronically Signed Final Report   03/01/2024 02:26 pm ----------------------------------------------------------------------    Assessment and Plan:  Pregnancy: H4E5995 at [redacted]w[redacted]d 1. History of poor fetal growth (Primary) 2. Multigravida of advanced maternal age in third trimester 3. [redacted] weeks gestation of pregnancy 4. Supervision of other normal pregnancy, antepartum Favorable cervix, labor precautions reviewed.  IOL scheduled on 12/21. NST performed today was reviewed and was found to be reactive. Subsequent BPP performed today was also reviewed and was found to be 8/8 for the ultrasound component, giving a total BPP score of 10/10. AFI was also normal.  Labor symptoms and general obstetric precautions including but not limited to vaginal bleeding, contractions, leaking of fluid and fetal movement were reviewed in detail with the patient. Please refer to After Visit Summary for other counseling recommendations.   Return for Postpartum check.  Future Appointments  Date Time Provider Department Center  03/22/2024 10:35 AM Alora Gorey, Gloris LABOR, MD [facility]  03/27/2024 12:00 AM [facility] SCHED ROOM [facility] None    Gloris Hugger, MD

## 2024-03-22 NOTE — Patient Instructions (Signed)

## 2024-03-22 NOTE — Progress Notes (Signed)

## 2024-03-24 ENCOUNTER — Encounter: Payer: MEDICAID | Admitting: Certified Nurse Midwife

## 2024-03-26 NOTE — H&P (Signed)
 OBSTETRIC ADMISSION HISTORY AND PHYSICAL  [name] is a 38 y.o. female [phone] with IUP at [redacted]w[redacted]d (dated by LMP c/with U/S, Estimated Date of Delivery: 04/03/24) presenting for IOL FGR 37%. Lowest EFW 1020g (5%ile) at [redacted]w[redacted]d.  She reports +FMs, No LOF, no VB, no blurry vision, headaches or peripheral edema, and RUQ pain.    She plans on breast and bottle feeding. She request BTL for birth control.  She received her prenatal care at [facility]   Prenatal History/Complications: FGR, AMA, anxiety/depression, s/p VSD repair  Past Medical History: Past Medical History:  Diagnosis Date   Depression    GERD (gastroesophageal reflux disease)    Hx of percutaneous transcatheter closure of congenital VSD    Migraine headache with aura    Panic attack     Past Surgical History: Past Surgical History:  Procedure Laterality Date   CARDIAC SURGERY     as a baby   TONSILLECTOMY     WISDOM TOOTH EXTRACTION Bilateral     Obstetrical History: OB History     Gravida  5   Para  4   Term  4   Preterm      AB      Living  4      SAB      IAB      Ectopic      Multiple  0   Live Births  4           Social History Social History   Socioeconomic History   Marital status: Single    Spouse name: Not on file   Number of children: Not on file   Years of education: Not on file   Highest education level: Some college, no degree  Occupational History   Not on file  Tobacco Use   Smoking status: Former    Current packs/day: 0.00    Types: Cigarettes    Quit date: 08/16/2010    Years since quitting: 13.6   Smokeless tobacco: Never  Vaping Use   Vaping status: Never Used  Substance and Sexual Activity   Alcohol use: Not Currently    Comment: last drink drink early September   Drug use: No   Sexual activity: Yes    Partners: Male    Birth control/protection: None  Other Topics Concern   Not on file  Social History Narrative   Not on file   Social  Drivers of Health   Tobacco Use: Medium Risk (03/28/2024)   Patient History    Smoking Tobacco Use: Former    Smokeless Tobacco Use: Never    Passive Exposure: Not on file  Financial Resource Strain: Low Risk (03/11/2023)   Overall Financial Resource Strain (CARDIA)    Difficulty of Paying Living Expenses: Not very hard  Food Insecurity: No Food Insecurity (03/28/2024)   Epic    Worried About Radiation Protection Practitioner of Food in the Last Year: Never true    Ran Out of Food in the Last Year: Never true  Transportation Needs: No Transportation Needs (03/28/2024)   Epic    Lack of Transportation (Medical): No    Lack of Transportation (Non-Medical): No  Physical Activity: Sufficiently Active (03/11/2023)   Exercise Vital Sign    Days of Exercise per Week: 4 days    Minutes of Exercise per Session: 60 min  Stress: No Stress Concern Present (03/11/2023)   [employer] of Occupational Health - Occupational Stress Questionnaire    Feeling of Stress :  Only a little  Social Connections: Moderately Isolated (03/11/2023)   Social Connection and Isolation Panel    Frequency of Communication with Friends and Family: Three times a week    Frequency of Social Gatherings with Friends and Family: Twice a week    Attends Religious Services: 1 to 4 times per year    Active Member of Clubs or Organizations: No    Attends Engineer, Structural: Not on file    Marital Status: Divorced  Depression (PHQ2-9): Low Risk (03/17/2024)   Depression (PHQ2-9)    PHQ-2 Score: 2  Alcohol Screen: Low Risk (03/11/2023)   Alcohol Screen    Last Alcohol Screening Score (AUDIT): 1  Housing: Low Risk (03/28/2024)   Epic    Unable to Pay for Housing in the Last Year: No    Number of Times Moved in the Last Year: 0    Homeless in the Last Year: No  Utilities: Not At Risk (03/28/2024)   Epic    Threatened with loss of utilities: No  Health Literacy: Not on file    Family History: Family History  Problem Relation  Age of Onset   Healthy Mother    Hypertension Father    Diabetes Father    Cancer Paternal Grandmother        breast   Stroke Paternal Grandfather    Asthma Paternal Aunt    Cancer Paternal Uncle        colon cancer   Congestive Heart Failure Paternal Uncle    Asthma Cousin    Congestive Heart Failure Other    Heart disease Neg Hx     Allergies: Allergies[1]  Medications Prior to Admission  Medication Sig Dispense Refill Last Dose/Taking   aspirin  EC 81 MG tablet Take 1 tablet (81 mg total) by mouth daily. Swallow whole. 30 tablet 12 Past Week   hydrocortisone  (ANUSOL -HC) 2.5 % rectal cream Place 1 Application rectally 2 (two) times daily. 30 g 0 03/28/2024   Prenatal Vit-Fe Fumarate-FA (PRENATAL MULTIVITAMIN) TABS tablet Take 1 tablet by mouth daily at 12 noon.   Past Month   magnesium  oxide (MAG-OX) 400 (240 Mg) MG tablet Take 1 tablet (400 mg total) by mouth daily. (Patient taking differently: Take 400 mg by mouth as needed.) 30 tablet 11 More than a month     Review of Systems  All systems reviewed and negative except as stated in HPI.  Blood pressure 122/70, pulse (!) 105, temperature 97.7 F (36.5 C), temperature source Axillary, resp. rate 16, height 5' 2 (1.575 m), weight 96.7 kg, last menstrual period 06/28/2023. General appearance: alert, cooperative, and appears stated age Lungs: breathing comfortably on room air Heart: regular rate Abdomen: soft, non-tender; gravid Extremities: no edema of bilateral lower extremities DTR's intact Presentation: cephalic Fetal monitoringBaseline: 150 bpm, Variability: Good {> 6 bpm), Accelerations: Reactive, and Decelerations: Absent Uterine activityNone   Prenatal labs: ABO, Rh: --/--/A POS (12/22 1108) Antibody: NEG (12/22 1108) Rubella: 1.28 (06/17 1100) RPR: NON REACTIVE (12/22 1110)  HBsAg: Negative (06/17 1100)  HIV: Non Reactive (10/07 0834)  GBS: Negative/-- (12/04 1100)  2 hr Glucola normal Genetic screening:  LR, female Anatomy US : b/l UTD (resolved), otherwise normal Last US : At [redacted]w[redacted]d - cephalic presentation, EFW 2547 (37 %tile), AC 58%  Prenatal Transfer Tool  Maternal Diabetes: No Genetic Screening: Abnormal:  Results: Other: 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 Maternal Ultrasounds/Referrals: IUGR and Fetal Kidney Anomalies (b/l UTD, resolved) Fetal Ultrasounds or other Referrals:  Fetal echo (normal) due to h/o maternal SVD s/p repair Maternal Substance Abuse:  No Significant Maternal Medications:  None Significant Maternal Lab Results:  Group B Strep negative Number of Prenatal Visits:greater than 3 verified prenatal visits Other Comments:  None  Results for orders placed or performed during the hospital encounter of 03/28/24 (from the past 24 hours)  Type and screen   Collection Time: 03/28/24 11:08 AM  Result Value Ref Range   ABO/RH(D) A POS    Antibody Screen NEG    Sample Expiration      03/31/2024,2359 Performed at [facility], [address] [place], [place] [address]   CBC   Collection Time: 03/28/24 11:10 AM  Result Value Ref Range   WBC 10.4 4.0 - 10.5 K/uL   RBC 4.45 3.87 - 5.11 MIL/uL   Hemoglobin 12.3 12.0 - 15.0 g/dL   HCT 63.8 63.9 - 53.9 %   MCV 81.1 80.0 - 100.0 fL   MCH 27.6 26.0 - 34.0 pg   MCHC 34.1 30.0 - 36.0 g/dL   RDW 85.2 88.4 - 84.4 %   Platelets 276 150 - 400 K/uL   nRBC 0.0 0.0 - 0.2 %  RPR   Collection Time: 03/28/24 11:10 AM  Result Value Ref Range   RPR Ser Ql NON REACTIVE NON REACTIVE    Patient Active Problem List   Diagnosis Date Noted   Indication for care in labor or delivery 03/28/2024   History of poor fetal growth 03/10/2024   Consultation for female sterilization 09/22/2023   Supervision of other normal pregnancy, antepartum 09/08/2023   Advanced maternal age in multigravida 09/08/2023   Anxiety and depression 05/21/2021   S/P VSD repair     Assessment/Plan:  [name] is a 38 y.o. H4E5995 at  [redacted]w[redacted]d here for IOL FGR 37%. Lowest EFW 1020g (5%ile) at [redacted]w[redacted]d.  #Labor: IOL process explained to the patient. Start on pitocin . #Pain: Plans unmedicated birth #FWB: Cat I #ID:  GBS neg #MOF: breast and bottle #MOC: BTL #Circ:  No  Barkley LITTIE Angles, MD 03/28/2024 6:48 PM        [1] No Known Allergies

## 2024-03-27 ENCOUNTER — Inpatient Hospital Stay (HOSPITAL_COMMUNITY): Payer: MEDICAID

## 2024-03-28 ENCOUNTER — Encounter (HOSPITAL_COMMUNITY): Payer: Self-pay | Admitting: Obstetrics and Gynecology

## 2024-03-28 ENCOUNTER — Inpatient Hospital Stay (HOSPITAL_COMMUNITY)
Admission: RE | Admit: 2024-03-28 | Discharge: 2024-03-29 | DRG: 807 | Disposition: A | Discharge status: Home / Self Care | Payer: MEDICAID | Attending: Obstetrics and Gynecology | Admitting: Obstetrics and Gynecology

## 2024-03-28 ENCOUNTER — Other Ambulatory Visit: Payer: Self-pay

## 2024-03-28 ENCOUNTER — Encounter (HOSPITAL_COMMUNITY): Payer: Self-pay | Admitting: Anesthesiology

## 2024-03-28 DIAGNOSIS — O365930 Maternal care for other known or suspected poor fetal growth, third trimester, not applicable or unspecified: Principal | ICD-10-CM | POA: Diagnosis present

## 2024-03-28 DIAGNOSIS — Z3A39 39 weeks gestation of pregnancy: Secondary | ICD-10-CM

## 2024-03-28 DIAGNOSIS — Z8249 Family history of ischemic heart disease and other diseases of the circulatory system: Secondary | ICD-10-CM

## 2024-03-28 DIAGNOSIS — Z3A36 36 weeks gestation of pregnancy: Secondary | ICD-10-CM

## 2024-03-28 DIAGNOSIS — Z87898 Personal history of other specified conditions: Secondary | ICD-10-CM

## 2024-03-28 DIAGNOSIS — O09523 Supervision of elderly multigravida, third trimester: Secondary | ICD-10-CM | POA: Diagnosis not present

## 2024-03-28 DIAGNOSIS — Z7982 Long term (current) use of aspirin: Secondary | ICD-10-CM

## 2024-03-28 DIAGNOSIS — Z302 Encounter for sterilization: Secondary | ICD-10-CM

## 2024-03-28 DIAGNOSIS — Z87891 Personal history of nicotine dependence: Secondary | ICD-10-CM

## 2024-03-28 DIAGNOSIS — K219 Gastro-esophageal reflux disease without esophagitis: Secondary | ICD-10-CM | POA: Diagnosis present

## 2024-03-28 DIAGNOSIS — Z833 Family history of diabetes mellitus: Secondary | ICD-10-CM | POA: Diagnosis not present

## 2024-03-28 DIAGNOSIS — O9962 Diseases of the digestive system complicating childbirth: Secondary | ICD-10-CM | POA: Diagnosis present

## 2024-03-28 LAB — CBC
HCT: 36.1 % (ref 36.0–46.0)
Hemoglobin: 12.3 g/dL (ref 12.0–15.0)
MCH: 27.6 pg (ref 26.0–34.0)
MCHC: 34.1 g/dL (ref 30.0–36.0)
MCV: 81.1 fL (ref 80.0–100.0)
Platelets: 276 K/uL (ref 150–400)
RBC: 4.45 MIL/uL (ref 3.87–5.11)
RDW: 14.7 % (ref 11.5–15.5)
WBC: 10.4 K/uL (ref 4.0–10.5)
nRBC: 0 % (ref 0.0–0.2)

## 2024-03-28 LAB — TYPE AND SCREEN
ABO/RH(D): A POS
Antibody Screen: NEGATIVE

## 2024-03-28 LAB — SYPHILIS: RPR W/REFLEX TO RPR TITER AND TREPONEMAL ANTIBODIES, TRADITIONAL SCREENING AND DIAGNOSIS ALGORITHM: RPR Ser Ql: NONREACTIVE

## 2024-03-28 MED ORDER — OXYTOCIN-SODIUM CHLORIDE 30-0.9 UT/500ML-% IV SOLN
1.0000 m[IU]/min | INTRAVENOUS | Status: DC
  Administered 2024-03-28: 2 m[IU]/min via INTRAVENOUS

## 2024-03-28 MED ORDER — LACTATED RINGERS IV SOLN: 500.0000 mL | INTRAVENOUS | Status: DC | PRN

## 2024-03-28 MED ORDER — FENTANYL CITRATE (PF) 100 MCG/2ML IJ SOLN
100.0000 ug | Freq: Once | INTRAMUSCULAR | Status: AC
  Administered 2024-03-28: 100 ug via INTRAVENOUS

## 2024-03-28 MED ORDER — SENNOSIDES-DOCUSATE SODIUM 8.6-50 MG PO TABS
2.0000 | ORAL_TABLET | ORAL | Status: DC
  Administered 2024-03-29: 2 via ORAL
  Filled 2024-03-28: qty 2

## 2024-03-28 MED ORDER — IBUPROFEN 800 MG PO TABS
800.0000 mg | ORAL_TABLET | Freq: Three times a day (TID) | ORAL | Status: DC
  Administered 2024-03-28 – 2024-03-29 (×3): 800 mg via ORAL
  Filled 2024-03-28 (×3): qty 1

## 2024-03-28 MED ORDER — OXYTOCIN-SODIUM CHLORIDE 30-0.9 UT/500ML-% IV SOLN
2.5000 [IU]/h | INTRAVENOUS | Status: DC
  Administered 2024-03-28: 2.5 [IU]/h via INTRAVENOUS
  Filled 2024-03-28: qty 500

## 2024-03-28 MED ORDER — OXYCODONE HCL 5 MG PO TABS: 5.0000 mg | ORAL_TABLET | Freq: Four times a day (QID) | ORAL | Status: DC | PRN

## 2024-03-28 MED ORDER — FENTANYL CITRATE (PF) 100 MCG/2ML IJ SOLN
INTRAMUSCULAR | Status: AC
  Filled 2024-03-28: qty 2

## 2024-03-28 MED ORDER — LACTATED RINGERS IV SOLN: INTRAVENOUS | Status: DC

## 2024-03-28 MED ORDER — LACTATED RINGERS IV SOLN: 500.0000 mL | Freq: Once | INTRAVENOUS | Status: DC

## 2024-03-28 MED ORDER — SIMETHICONE 80 MG PO CHEW: 80.0000 mg | CHEWABLE_TABLET | ORAL | Status: DC | PRN

## 2024-03-28 MED ORDER — ONDANSETRON HCL 4 MG/2ML IJ SOLN: 4.0000 mg | INTRAMUSCULAR | Status: DC | PRN

## 2024-03-28 MED ORDER — EPHEDRINE 5 MG/ML INJ: 10.0000 mg | INTRAVENOUS | Status: DC | PRN

## 2024-03-28 MED ORDER — SODIUM CHLORIDE 0.9% FLUSH: 3.0000 mL | INTRAVENOUS | Status: DC | PRN

## 2024-03-28 MED ORDER — PRENATAL MULTIVITAMIN CH
1.0000 | ORAL_TABLET | Freq: Every day | ORAL | Status: DC
  Administered 2024-03-29: 1 via ORAL
  Filled 2024-03-28: qty 1

## 2024-03-28 MED ORDER — MISOPROSTOL 25 MCG QUARTER TABLET: 25.0000 ug | ORAL_TABLET | Freq: Once | ORAL | Status: DC

## 2024-03-28 MED ORDER — MEASLES, MUMPS & RUBELLA VAC ~~LOC~~ SUSR: 0.5000 mL | Freq: Once | SUBCUTANEOUS | Status: DC

## 2024-03-28 MED ORDER — BENZOCAINE-MENTHOL 20-0.5 % EX AERO
1.0000 | INHALATION_SPRAY | CUTANEOUS | Status: DC | PRN
  Filled 2024-03-28: qty 56

## 2024-03-28 MED ORDER — ONDANSETRON HCL 4 MG/2ML IJ SOLN: 4.0000 mg | Freq: Four times a day (QID) | INTRAMUSCULAR | Status: DC | PRN

## 2024-03-28 MED ORDER — ONDANSETRON HCL 4 MG PO TABS: 4.0000 mg | ORAL_TABLET | ORAL | Status: DC | PRN

## 2024-03-28 MED ORDER — WITCH HAZEL-GLYCERIN EX PADS: 1.0000 | MEDICATED_PAD | CUTANEOUS | Status: DC | PRN

## 2024-03-28 MED ORDER — TERBUTALINE SULFATE 1 MG/ML IJ SOLN: 0.2500 mg | Freq: Once | INTRAMUSCULAR | Status: DC | PRN

## 2024-03-28 MED ORDER — SODIUM CHLORIDE 0.9 % IV SOLN: 250.0000 mL | INTRAVENOUS | Status: DC | PRN

## 2024-03-28 MED ORDER — TETANUS-DIPHTH-ACELL PERTUSSIS 5-2-15.5 LF-MCG/0.5 IM SUSP: 0.5000 mL | Freq: Once | INTRAMUSCULAR | Status: DC

## 2024-03-28 MED ORDER — FENTANYL-BUPIVACAINE-NACL 0.5-0.125-0.9 MG/250ML-% EP SOLN
12.0000 mL/h | EPIDURAL | Status: DC | PRN
  Filled 2024-03-28: qty 250

## 2024-03-28 MED ORDER — ACETAMINOPHEN 325 MG PO TABS: 650.0000 mg | ORAL_TABLET | ORAL | Status: DC | PRN

## 2024-03-28 MED ORDER — OXYTOCIN BOLUS FROM INFUSION
333.0000 mL | Freq: Once | INTRAVENOUS | Status: AC
  Administered 2024-03-28: 333 mL via INTRAVENOUS

## 2024-03-28 MED ORDER — DIPHENHYDRAMINE HCL 50 MG/ML IJ SOLN: 12.5000 mg | INTRAMUSCULAR | Status: DC | PRN

## 2024-03-28 MED ORDER — FENTANYL CITRATE (PF) 100 MCG/2ML IJ SOLN
50.0000 ug | INTRAMUSCULAR | Status: AC | PRN
  Administered 2024-03-28 (×2): 50 ug via INTRAVENOUS
  Filled 2024-03-28 (×2): qty 2

## 2024-03-28 MED ORDER — COCONUT OIL OIL: 1.0000 | TOPICAL_OIL | Status: DC | PRN

## 2024-03-28 MED ORDER — TRANEXAMIC ACID-NACL 1000-0.7 MG/100ML-% IV SOLN
1000.0000 mg | INTRAVENOUS | Status: AC
  Administered 2024-03-28: 1000 mg via INTRAVENOUS
  Filled 2024-03-28: qty 100

## 2024-03-28 MED ORDER — LIDOCAINE HCL (PF) 1 % IJ SOLN: 30.0000 mL | INTRAMUSCULAR | Status: DC | PRN

## 2024-03-28 MED ORDER — PHENYLEPHRINE 80 MCG/ML (10ML) SYRINGE FOR IV PUSH (FOR BLOOD PRESSURE SUPPORT): 80.0000 ug | PREFILLED_SYRINGE | INTRAVENOUS | Status: DC | PRN

## 2024-03-28 MED ORDER — DIBUCAINE (PERIANAL) 1 % EX OINT: 1.0000 | TOPICAL_OINTMENT | CUTANEOUS | Status: DC | PRN

## 2024-03-28 MED ORDER — DIPHENHYDRAMINE HCL 25 MG PO CAPS: 25.0000 mg | ORAL_CAPSULE | Freq: Four times a day (QID) | ORAL | Status: DC | PRN

## 2024-03-28 MED ORDER — SODIUM CHLORIDE 0.9% FLUSH: 3.0000 mL | Freq: Two times a day (BID) | INTRAVENOUS | Status: DC

## 2024-03-28 MED ORDER — SOD CITRATE-CITRIC ACID 500-334 MG/5ML PO SOLN: 30.0000 mL | ORAL | Status: DC | PRN

## 2024-03-28 NOTE — Discharge Summary (Signed)
 "    Postpartum Discharge Summary  Date of Service updated***     Patient Name: [name] DOB: 1985/04/14 MRN: [id number]  Date of admission: 03/28/2024 Delivery date:03/28/2024 Delivering provider: CASHION, COLTER L Date of discharge: 03/28/2024  Admitting diagnosis: Indication for care in labor or delivery [O75.9] Intrauterine pregnancy: [redacted]w[redacted]d     Secondary diagnosis:  Principal Problem:   Indication for care in labor or delivery  Additional problems: ***    Discharge diagnosis: {DX.:23714}                                              Post partum procedures:{Postpartum procedures:23558} Augmentation: Pitocin  Complications: None  Hospital course: Induction of Labor With Vaginal Delivery   38 y.o. yo H4E5995 at [redacted]w[redacted]d was admitted to the hospital 03/28/2024 for induction of labor.  Indication for induction: resolved IUGR.  Patient had an uncomplicated labor course. Membrane Rupture Time/Date: 4:00 PM,03/28/2024  Delivery Method:Vaginal, Spontaneous Operative Delivery:N/A Episiotomy:   Lacerations:    Details of delivery can be found in separate delivery note.  Patient had a postpartum course complicated by***. Patient is discharged home 03/28/2024.  Newborn Data: Birth date:03/28/2024 Birth time:6:59 PM Gender:Female Living status:  Apgars: ,  Weight:   Magnesium  Sulfate received: {Mag received:30440022} BMZ received: No Rhophylac:N/A MMR:N/A T-DaP:Given prenatally Flu: No RSV Vaccine received: No Transfusion:{Transfusion received:30440034}  Immunizations received: Immunization History  Administered Date(s) Administered   Hepatitis B 01/21/1999, 02/21/1999, 07/23/1999   Hpv-Unspecified 06/18/2005, 08/18/2005   Influenza Whole 02/25/2005   Influenza-Unspecified 12/15/2017   MMR 12/07/2012   Meningococcal polysaccharide vaccine (MPSV4) 07/22/2005   Tdap 12/06/2012, 07/04/2020, 10/01/2021, 01/12/2024    Physical exam  Vitals:   03/28/24 1410 03/28/24  1550 03/28/24 1653 03/28/24 1850  BP: 121/82 112/69 122/70 (!) 117/57  Pulse: (!) 104 (!) 101 (!) 105 (!) 115  Resp:  16  20  Temp:  97.7 F (36.5 C)  98.5 F (36.9 C)  TempSrc:  Axillary  Axillary  Weight:      Height:       General: {Exam; general:21111117} Lochia: {Desc; appropriate/inappropriate:30686::appropriate} Uterine Fundus: {Desc; firm/soft:30687} Incision: {Exam; incision:21111123} DVT Evaluation: {Exam; dvt:2111122} Labs: Lab Results  Component Value Date   WBC 10.4 03/28/2024   HGB 12.3 03/28/2024   HCT 36.1 03/28/2024   MCV 81.1 03/28/2024   PLT 276 03/28/2024      Latest Ref Rng & Units 09/04/2022    8:55 AM  CMP  Glucose 70 - 99 mg/dL 896   BUN 6 - 23 mg/dL 14   Creatinine 9.59 - 1.20 mg/dL 9.36   Sodium 864 - 854 mEq/L 138   Potassium 3.5 - 5.1 mEq/L 4.5   Chloride 96 - 112 mEq/L 107   CO2 19 - 32 mEq/L 22   Calcium 8.4 - 10.5 mg/dL 9.3   Total Protein 6.0 - 8.3 g/dL 7.5   Total Bilirubin 0.2 - 1.2 mg/dL 0.3   Alkaline Phos 39 - 117 U/L 55   AST 0 - 37 U/L 12   ALT 0 - 35 U/L 6    Edinburgh Score:    12/31/2021   10:00 AM  Edinburgh Postnatal Depression Scale Screening Tool  I have been able to laugh and see the funny side of things. 0   I have looked forward with enjoyment to things. 0   I have blamed  myself unnecessarily when things went wrong. 0   I have been anxious or worried for no good reason. 2   I have felt scared or panicky for no good reason. 0   Things have been getting on top of me. 0   I have been so unhappy that I have had difficulty sleeping. 0   I have felt sad or miserable. 0   I have been so unhappy that I have been crying. 0   The thought of harming myself has occurred to me. 0   Edinburgh Postnatal Depression Scale Total 2      Data saved with a previous flowsheet row definition   No data recorded  After visit meds:  Allergies as of 03/28/2024   No Known Allergies   Med Rec must be completed prior to using this  [facility]***        Discharge home in stable condition Infant Feeding: {Baby feeding:23562} Infant Disposition:{CHL IP OB HOME WITH FNUYZM:76418} Discharge instruction: per After Visit Summary and Postpartum booklet. Activity: Advance as tolerated. Pelvic rest for 6 weeks.  Diet: {OB ipzu:78888878} Future Appointments:No future appointments. Follow up Visit:   Please schedule this patient for a In person postpartum visit in 6 weeks with the following provider: Any provider. Additional Postpartum F/U:Incision check 1 week  Low risk pregnancy complicated by: N/A Delivery mode:  Vaginal, Spontaneous Anticipated Birth Control:  BTL done [facility]   03/28/2024 Colter LITTIE Angles, MD    "

## 2024-03-28 NOTE — Anesthesia Preprocedure Evaluation (Addendum)
"                                    Anesthesia Evaluation  Patient identified by MRN, date of birth, ID band Patient awake    Reviewed: Allergy & Precautions, H&P , NPO status , Patient's Chart, lab work & pertinent test results  Airway Mallampati: II  TM Distance: >3 FB Neck ROM: Full    Dental no notable dental hx.    Pulmonary neg pulmonary ROS, former smoker   Pulmonary exam normal breath sounds clear to auscultation       Cardiovascular negative cardio ROS Normal cardiovascular exam Rhythm:Regular Rate:Normal     Neuro/Psych  Headaches  Anxiety Depression     negative psych ROS   GI/Hepatic Neg liver ROS,GERD  ,,  Endo/Other  negative endocrine ROS    Renal/GU negative Renal ROS  negative genitourinary   Musculoskeletal negative musculoskeletal ROS (+)    Abdominal  (+) + obese  Peds negative pediatric ROS (+)  Hematology negative hematology ROS (+)   Anesthesia Other Findings   Reproductive/Obstetrics (+) Pregnancy                              Anesthesia Physical Anesthesia Plan  ASA: 2  Anesthesia Plan: Epidural   Post-op Pain Management:    Induction:   PONV Risk Score and Plan:   Airway Management Planned:   Additional Equipment:   Intra-op Plan:   Post-operative Plan:   Informed Consent:   Plan Discussed with:   Anesthesia Plan Comments:         Anesthesia Quick Evaluation  "

## 2024-03-29 ENCOUNTER — Inpatient Hospital Stay (HOSPITAL_COMMUNITY): Payer: MEDICAID | Admitting: Anesthesiology

## 2024-03-29 ENCOUNTER — Encounter (HOSPITAL_COMMUNITY): Admission: RE | Disposition: A | Payer: Self-pay | Source: Home / Self Care | Attending: Family Medicine

## 2024-03-29 DIAGNOSIS — Z302 Encounter for sterilization: Secondary | ICD-10-CM

## 2024-03-29 SURGERY — LIGATION, FALLOPIAN TUBE, POSTPARTUM
Anesthesia: Epidural | Site: Abdomen | Laterality: Bilateral

## 2024-03-29 MED ORDER — ACETAMINOPHEN 500 MG PO TABS: 1000.0000 mg | ORAL_TABLET | Freq: Four times a day (QID) | ORAL | Status: AC | PRN

## 2024-03-29 MED ORDER — OXYCODONE HCL 5 MG PO TABS: 5.0000 mg | ORAL_TABLET | Freq: Four times a day (QID) | ORAL | 0 refills | Status: DC | PRN

## 2024-03-29 MED ORDER — METOCLOPRAMIDE HCL 10 MG PO TABS
10.0000 mg | ORAL_TABLET | Freq: Once | ORAL | Status: AC
  Administered 2024-03-29: 10 mg via ORAL
  Filled 2024-03-29: qty 1

## 2024-03-29 MED ORDER — LACTATED RINGERS IV SOLN: INTRAVENOUS | Status: DC

## 2024-03-29 MED ORDER — FAMOTIDINE 20 MG PO TABS
40.0000 mg | ORAL_TABLET | Freq: Once | ORAL | Status: AC
  Administered 2024-03-29: 40 mg via ORAL
  Filled 2024-03-29: qty 2

## 2024-03-29 MED ORDER — IBUPROFEN 600 MG PO TABS: 600.0000 mg | ORAL_TABLET | Freq: Four times a day (QID) | ORAL | 1 refills | Status: AC | PRN

## 2024-03-29 NOTE — Lactation Note (Signed)
 This note was copied from a baby's chart. Lactation Consultation Note  Patient Name: [name] Date: 03/29/2024 Age:37 hours Reason for consult: Initial assessment;Term;Breastfeeding assistance  P5- MOB reports that infant has been latching, but he is not very interested in sucking at this time. MOB reports that her last two children had tongue and lip ties that prevented them from latching. MOB is concerned over this infant having a tongue and lip tie, but denies pain/discomfort as infant nurses. LC assessed infant's mouth before latching him and noted a tight lingual frenulum and a lingual frenulum that is about 2 mm away from the tip of the tongue. LC also noted that infant's tongue seems to rest at the gumline. LC unable to see tongue extend past. LC encouraged MOB to review her concerns with pediatrician if feedings become a problem or are painful. MOB has physician list. MOB placed infant on the left breast in the football hold. Infant was belly to belly and MOB stroked her nipple from his nose to chin. Infant latched with first attempt and had a deep latch with flanged lips. LC was able to stimulate a few sucks out of infant, but overall he had no interest in nursing.  MOB was able to express rolling colostrum into infant's mouth during feeding. This too did not stimulate infant into nursing. Per MOB request, LC provided MOB with a manual pump (18mm flange) and syringes. LC reviewed the first 24 hr birthday nap, day 2 cluster feeding, feeding infant on cue 8-12x in 24 hrs, not allowing infant to go over 3 hrs without a feeding, CDC milk storage guidelines, LC services handout and engorgement/breast care. LC encouraged MOB to call for further assistance as needed.  Maternal Data Has patient been taught Hand Expression?: Yes Does the patient have breastfeeding experience prior to this delivery?: Yes How long did the patient breastfeed?: First two children were formula only, last  two children she was able to exclusively pump for 6 months each. Per MOB she could not breastfeed her last two children due to tongue and lip ties.  Feeding Mother's Current Feeding Choice: Breast Milk and Formula  LATCH Score Latch: Repeated attempts needed to sustain latch, nipple held in mouth throughout feeding, stimulation needed to elicit sucking reflex.  Audible Swallowing: None  Type of Nipple: Everted at rest and after stimulation  Comfort (Breast/Nipple): Soft / non-tender  Hold (Positioning): No assistance needed to correctly position infant at breast.  LATCH Score: 7   Lactation Tools Discussed/Used Tools: Pump;Flanges Flange Size: 18 Breast pump type: Manual Pump Education: Setup, frequency, and cleaning;Milk Storage Reason for Pumping: MOB request Pumping frequency: 15-20 min every 3 hrs  Interventions Interventions: Breast feeding basics reviewed;Assisted with latch;Hand express;Breast compression;Adjust position;Support pillows;Position options;Hand pump;Education;LC Services brochure  Discharge Discharge Education: Engorgement and breast care;Warning signs for feeding baby;Outpatient recommendation Pump: Manual;Hands Free;Personal  Consult Status Consult Status: Follow-up Date: 03/29/24 Follow-up type: In-patient    Recardo Hoit BS, IBCLC 03/29/2024, 12:35 AM

## 2024-03-29 NOTE — Progress Notes (Signed)
 POSTPARTUM PROGRESS NOTE  Post Partum Day 1  Subjective:  [name] is a 38 y.o. H4E4994 s/p SVD at [redacted]w[redacted]d.  She reports she is doing well. No acute events overnight. She denies any problems with ambulating, voiding or po intake. Denies nausea or vomiting.  Pain is well controlled.  Lochia is appropriate.  Objective: Blood pressure 116/79, pulse 90, temperature 98.2 F (36.8 C), temperature source Oral, resp. rate 18, height 5' 2 (1.575 m), weight 96.7 kg, last menstrual period 06/28/2023, SpO2 98%, unknown if currently breastfeeding.  Physical Exam:  General: alert, cooperative and no distress Chest: no respiratory distress Heart:regular rate, distal pulses intact Abdomen: soft, nontender,  Uterine Fundus: firm, appropriately tender DVT Evaluation: No calf swelling or tenderness Extremities: trace edema Skin: warm, dry  Recent Labs    03/28/24 1110  HGB 12.3  HCT 36.1    Assessment/Plan: [name] is a 38 y.o. H4E4994 s/p SVD at [redacted]w[redacted]d   PPD#1 - Doing well  Routine postpartum care  Contraception: Reports she has Slynd  at home and will start taking it, planning on interval tubal Feeding: Breast  Dispo: Plan for discharge tomorrow.   LOS: 1 day   Steffan Rover, MD Attending Family Medicine Physician, [facility] for [facility], [facility]   03/29/2024, 2:23 PM

## 2024-03-29 NOTE — Progress Notes (Signed)
 Patient has declined BTL now since it has been delayed. [name], Steva [place]

## 2024-03-29 NOTE — Lactation Note (Signed)
 This note was copied from a baby's chart. Lactation Consultation Note  Patient Name: [name] Date: 03/29/2024 Age:37 hours Reason for consult:  (attempted to see mom she was in the bathroom) Will f/u        Rollene Caldron Shaquisha Wynn 03/29/2024, 10:23 AM

## 2024-03-29 NOTE — Lactation Note (Signed)
 This note was copied from a baby's chart. Lactation Consultation Note  Patient Name: [name] Date: 03/29/2024 Age:37 hours Reason for consult: Follow-up assessment, infant weight loss -1.58%  P5, MOB informed LC infant is breastfeeding for 1-2 minutes, LC asked MOB would she like latch assistance but she declined. MOB has hand pump and expressing 12 mls that she is offering to infant with curve tip syringe. LC discussed MOB using a DEBP but she declined. LC discussed if she would like to have latch assistance LC services is available to help her. LC discussed with MOB if infant breastfeeds for 10-20 minutes on day 2 if she wants to continue to supplement infant intake is (7-12 mls) but if infant does not latch only breastfeed for 1-2 minutes, infant's intake is 15-30 mls on Day 2 of life. Handout given  Infant Feeding Guidelines.  Maternal Data    Feeding Mother's Current Feeding Choice: Breast Milk  LATCH Score                    Lactation Tools Discussed/Used    Interventions Interventions: Hand pump;DEBP;Expressed milk;Education  Discharge    Consult Status Consult Status: Follow-up Date: 03/30/24 Follow-up type: In-patient    Grayce LULLA Batter 03/29/2024, 4:35 PM

## 2024-03-29 NOTE — Plan of Care (Signed)

## 2024-03-29 NOTE — Progress Notes (Signed)
 MOB was referred for history of depression/anxiety.  * Referral screened out by Clinical Social Worker because none of the following  criteria appear to apply:  ~ History of anxiety/depression during this pregnancy, or of post-partum depression following prior delivery.  ~ Diagnosis of anxiety and/or depression within last 3 years  Per OB notes, MOB did not indicate any signs/symptoms during her pregnancy.  Edinburgh score 2  Anxiety /Depression 2017  OR  * MOB's symptoms currently being treated with medication and/or therapy.  Please contact the Clinical Social Worker if needs arise, by [facility] request, or if MOB scores greater than 9/yes to question 10 on Edinburgh Postpartum Depression Screen.  Rosina Molt, [place] Clinical Social Worker [phone]

## 2024-03-29 NOTE — Progress Notes (Signed)
 Post Partum Day 1 Subjective: no complaints Pt initially wanted to change BTL to OP due to wanting to eat but after determining that she would most likely be able to have BTL before lunchtime she would like to continue with original plan for BTL today. Hopes for D/C tonight. Still certain she wants BTL. Has remained NPO.  Objective: Blood pressure 120/81, pulse 98, temperature 98.5 F (36.9 C), temperature source Axillary, resp. rate 18, height 5' 2 (1.575 m), weight 96.7 kg, last menstrual period 06/28/2023, SpO2 98%, unknown if currently breastfeeding.  Physical Exam:  General: alert, cooperative, appears stated age, and no distress Lochia: appropriate Uterine Fundus: firm Incision: NA DVT Evaluation: No evidence of DVT seen on physical exam.  Recent Labs    03/28/24 1110  HGB 12.3  HCT 36.1    Assessment/Plan: Discharge home at 24 hours if baby ready and doing OK Post-op BTL  Breastfeeding going better than other babies - Lactation consult  Contraception BTL   LOS: 1 day   Latha Staunton  Claudene, CNM 03/29/2024, 11:34 AM

## 2024-03-29 NOTE — Progress Notes (Signed)
 Notified by RN that patient declined inpatient BTL and would like to plan for an interval tubal at this time. Regular diet order placed. Plan for discharge this evening after infant is 56 hours old.  Toni DELENA Courts, MD

## 2024-03-29 NOTE — Anesthesia Preprocedure Evaluation (Addendum)
"                                    Anesthesia Evaluation  Patient identified by MRN, date of birth, ID band Patient awake    Reviewed: Allergy & Precautions, H&P , NPO status , Patient's Chart, lab work & pertinent test results  Airway Mallampati: II  TM Distance: >3 FB Neck ROM: Full    Dental no notable dental hx. (+) Teeth Intact, Dental Advisory Given   Pulmonary former smoker   Pulmonary exam normal breath sounds clear to auscultation       Cardiovascular negative cardio ROS Normal cardiovascular exam Rhythm:Regular Rate:Normal  Hx/o VSD S/P repair   Neuro/Psych  Headaches PSYCHIATRIC DISORDERS Anxiety Depression       GI/Hepatic Neg liver ROS,GERD  ,,  Endo/Other  negative endocrine ROS    Renal/GU negative Renal ROS  negative genitourinary   Musculoskeletal negative musculoskeletal ROS (+)    Abdominal  (+) + obese  Peds negative pediatric ROS (+)  Hematology negative hematology ROS (+)   Anesthesia Other Findings   Reproductive/Obstetrics Desires sterilization post partum                              Anesthesia Physical Anesthesia Plan  ASA: 2  Anesthesia Plan: Epidural   Post-op Pain Management:    Induction: Intravenous  PONV Risk Score and Plan: 4 or greater and Treatment may vary due to age or medical condition, Ondansetron  and Dexamethasone   Airway Management Planned: Natural Airway and Simple Face Mask  Additional Equipment: None  Intra-op Plan:   Post-operative Plan:   Informed Consent: I have reviewed the patients History and Physical, chart, labs and discussed the procedure including the risks, benefits and alternatives for the proposed anesthesia with the patient or authorized representative who has indicated his/her understanding and acceptance.     Dental advisory given  Plan Discussed with: Anesthesiologist and CRNA  Anesthesia Plan Comments:          Anesthesia Quick  Evaluation  "

## 2024-04-01 LAB — BIRTH TISSUE RECOVERY COLLECTION (PLACENTA DONATION)

## 2024-04-04 ENCOUNTER — Telehealth (HOSPITAL_COMMUNITY): Payer: Self-pay

## 2024-04-04 ENCOUNTER — Ambulatory Visit: Payer: MEDICAID

## 2024-04-04 NOTE — Telephone Encounter (Signed)
 04/04/2024 1716  Name: [name] MRN: [id number] DOB: September 13, 1985  Reason for Call:  Transition of Care Hospital Discharge Call  Contact Status: Patient Contact Status: Message  Language assistant needed:          Follow-Up Questions:    Van Postnatal Depression Scale:  In the Past 7 Days:    PHQ2-9 Depression Scale:     Discharge Follow-up:    Post-discharge interventions: NA  Signature  Rosaline Deretha PEAK

## 2024-04-19 ENCOUNTER — Telehealth: Payer: Self-pay

## 2024-04-19 NOTE — Telephone Encounter (Signed)
 I called patient to notify her that Dr. Fredirick has available OR time on 04/27/24 at 2 pm. I left a voicemail notifying patient that  would secure the surgery date and to please call me back to confirm or deny details.

## 2024-04-20 ENCOUNTER — Encounter: Payer: Self-pay | Admitting: Family Medicine

## 2024-04-27 ENCOUNTER — Ambulatory Visit: Admit: 2024-04-27 | Payer: MEDICAID | Admitting: Family Medicine

## 2024-04-27 SURGERY — SALPINGECTOMY, BILATERAL, LAPAROSCOPIC
Anesthesia: Choice

## 2024-05-13 ENCOUNTER — Ambulatory Visit (INDEPENDENT_AMBULATORY_CARE_PROVIDER_SITE_OTHER): Payer: MEDICAID | Admitting: Obstetrics & Gynecology

## 2024-05-13 ENCOUNTER — Encounter: Payer: Self-pay | Admitting: Obstetrics & Gynecology

## 2024-05-13 NOTE — Progress Notes (Signed)
 "   Post Partum Visit Note  [name] is a 39 y.o. [id number] female who presents for a postpartum visit. She is 6 weeks postpartum following a normal spontaneous vaginal delivery.  I have fully reviewed the prenatal and intrapartum course. The delivery was at [redacted]w[redacted]d gestational weeks.  Anesthesia: epidural. Postpartum course has been well. Baby is doing well. Baby is feeding by Breast Milk-pumping. Bleeding no bleeding. Bowel function is normal. Bladder function is normal. Patient is not sexually active. Contraception method is oral progesterone-only contraceptive -Slynd . Postpartum depression screening: negative.  The pregnancy intention screening data noted above was reviewed. Potential methods of contraception were discussed. The patient elected to proceed with POPs.   Edinburgh Postnatal Depression Scale - 05/13/24 0942       Edinburgh Postnatal Depression Scale:  In the Past 7 Days   I have been able to laugh and see the funny side of things. 0    I have looked forward with enjoyment to things. 0    I have blamed myself unnecessarily when things went wrong. 1    I have been anxious or worried for no good reason. 1    I have felt scared or panicky for no good reason. 0    Things have been getting on top of me. 0    I have been so unhappy that I have had difficulty sleeping. 0    I have felt sad or miserable. 0    I have been so unhappy that I have been crying. 0    The thought of harming myself has occurred to me. 0    Edinburgh Postnatal Depression Scale Total 2          Health Maintenance Due  Topic Date Due   HPV VACCINES (3 - Risk 3-dose series) 12/19/2005   Influenza Vaccine  11/06/2023   COVID-19 Vaccine (1 - 2025-26 season) Never done    The following portions of the patient's history were reviewed and updated as appropriate: allergies, current medications, past family history, past medical history, past social history, past surgical history, and problem  list.  Review of Systems Pertinent items noted in HPI and remainder of comprehensive ROS otherwise negative.  Objective:  BP 120/77   Pulse 87   Wt 201 lb (91.2 kg)   LMP 06/28/2023   Breastfeeding Yes   BMI 36.76 kg/m    General:  alert and no distress   Breasts:  not indicated  Lungs: clear to auscultation bilaterally  Heart:  regular rate and rhythm  Abdomen: soft, non-tender; bowel sounds normal; no masses,  no organomegaly   GU exam:  not indicated       Assessment:   Normal postpartum exam.   Plan:   Essential components of care per ACOG recommendations:  1.  Mood and well being: Patient with negative depression screening today. Reviewed local resources for support.  - Patient tobacco use? No.   - hx of drug use? No.    2. Infant care and feeding:  -Patient currently breastmilk feeding? Yes. Reviewed importance of draining breast regularly to support lactation.  -Social determinants of health (SDOH) reviewed in EPIC. No concerns.  3. Sexuality, contraception and birth spacing - Patient does not want a pregnancy in the next year.  Desired family size is 5 children.  - Reviewed reproductive life planning. Reviewed contraceptive methods based on pt preferences and effectiveness.  Patient desired Oral Contraceptive, already on Slynd  without concerns. - Discussed birth spacing of  18 months  4. Sleep and fatigue -Encouraged family/partner/community support of 4 hrs of uninterrupted sleep to help with mood and fatigue  5. Physical Recovery  - Discussed patients delivery and complications. She describes her labor as good. - Patient had a Vaginal, no problems at delivery. Patient had no laceration. Perineal healing reviewed. Patient expressed understanding - Patient has urinary incontinence? No. - Patient is safe to resume physical and sexual activity  6.  Health Maintenance - HM due items addressed Yes - Last pap smear  Diagnosis  Date Value Ref Range Status   05/21/2021   Final   - Negative for intraepithelial lesion or malignancy (NILM)   Pap smear not done at today's visit.  -Breast Cancer screening indicated? No.     Kaipo Ardis, MD [facility], [facility]  "
